# Patient Record
Sex: Female | Born: 1935 | Race: White | Hispanic: No | Marital: Single | Smoking: Never smoker
Health system: Southern US, Community
[De-identification: ages and names within clinical notes are randomized; demographics above are authoritative.]

## PROBLEM LIST (undated history)

## (undated) DIAGNOSIS — R627 Adult failure to thrive: Secondary | ICD-10-CM

## (undated) DIAGNOSIS — E039 Hypothyroidism, unspecified: Secondary | ICD-10-CM

## (undated) DIAGNOSIS — F419 Anxiety disorder, unspecified: Secondary | ICD-10-CM

## (undated) DIAGNOSIS — R339 Retention of urine, unspecified: Secondary | ICD-10-CM

## (undated) DIAGNOSIS — I10 Essential (primary) hypertension: Secondary | ICD-10-CM

## (undated) DIAGNOSIS — N183 Chronic kidney disease, stage 3 unspecified: Secondary | ICD-10-CM

## (undated) DIAGNOSIS — F32A Depression, unspecified: Secondary | ICD-10-CM

## (undated) DIAGNOSIS — R933 Abnormal findings on diagnostic imaging of other parts of digestive tract: Secondary | ICD-10-CM

## (undated) HISTORY — DX: Chronic kidney disease, stage 3 unspecified: N18.30

## (undated) HISTORY — DX: Depression, unspecified: F32.A

## (undated) HISTORY — DX: Adult failure to thrive: R62.7

## (undated) HISTORY — DX: Essential (primary) hypertension: I10

## (undated) HISTORY — PX: ABDOMINAL HYSTERECTOMY: SHX81

## (undated) HISTORY — DX: Retention of urine, unspecified: R33.9

---

## 1998-12-10 ENCOUNTER — Other Ambulatory Visit: Admission: RE | Admit: 1998-12-10 | Discharge: 1998-12-10 | Payer: Self-pay | Admitting: Gynecology

## 1999-12-11 ENCOUNTER — Other Ambulatory Visit: Admission: RE | Admit: 1999-12-11 | Discharge: 1999-12-11 | Payer: Self-pay | Admitting: Gynecology

## 1999-12-11 ENCOUNTER — Encounter (INDEPENDENT_AMBULATORY_CARE_PROVIDER_SITE_OTHER): Payer: Self-pay | Admitting: Specialist

## 2000-12-09 ENCOUNTER — Other Ambulatory Visit: Admission: RE | Admit: 2000-12-09 | Discharge: 2000-12-09 | Payer: Self-pay | Admitting: Gynecology

## 2002-01-24 ENCOUNTER — Other Ambulatory Visit: Admission: RE | Admit: 2002-01-24 | Discharge: 2002-01-24 | Payer: Self-pay | Admitting: Gynecology

## 2003-02-01 ENCOUNTER — Other Ambulatory Visit: Admission: RE | Admit: 2003-02-01 | Discharge: 2003-02-01 | Payer: Self-pay | Admitting: Gynecology

## 2003-03-23 ENCOUNTER — Ambulatory Visit (HOSPITAL_BASED_OUTPATIENT_CLINIC_OR_DEPARTMENT_OTHER): Admission: RE | Admit: 2003-03-23 | Discharge: 2003-03-23 | Payer: Self-pay | Admitting: Gynecology

## 2003-03-23 ENCOUNTER — Ambulatory Visit (HOSPITAL_COMMUNITY): Admission: RE | Admit: 2003-03-23 | Discharge: 2003-03-23 | Payer: Self-pay | Admitting: Gynecology

## 2003-03-23 ENCOUNTER — Encounter (INDEPENDENT_AMBULATORY_CARE_PROVIDER_SITE_OTHER): Payer: Self-pay | Admitting: *Deleted

## 2003-05-29 ENCOUNTER — Encounter (INDEPENDENT_AMBULATORY_CARE_PROVIDER_SITE_OTHER): Payer: Self-pay

## 2003-05-29 ENCOUNTER — Observation Stay (HOSPITAL_COMMUNITY): Admission: AD | Admit: 2003-05-29 | Discharge: 2003-05-30 | Payer: Self-pay | Admitting: Gynecology

## 2004-02-04 ENCOUNTER — Other Ambulatory Visit: Admission: RE | Admit: 2004-02-04 | Discharge: 2004-02-04 | Payer: Self-pay | Admitting: Gynecology

## 2005-02-16 ENCOUNTER — Other Ambulatory Visit: Admission: RE | Admit: 2005-02-16 | Discharge: 2005-02-16 | Payer: Self-pay | Admitting: Gynecology

## 2006-02-19 ENCOUNTER — Other Ambulatory Visit: Admission: RE | Admit: 2006-02-19 | Discharge: 2006-02-19 | Payer: Self-pay | Admitting: Gynecology

## 2007-02-21 ENCOUNTER — Other Ambulatory Visit: Admission: RE | Admit: 2007-02-21 | Discharge: 2007-02-21 | Payer: Self-pay | Admitting: Gynecology

## 2008-03-14 ENCOUNTER — Encounter (INDEPENDENT_AMBULATORY_CARE_PROVIDER_SITE_OTHER): Payer: Self-pay | Admitting: Gynecology

## 2008-03-14 ENCOUNTER — Ambulatory Visit (HOSPITAL_BASED_OUTPATIENT_CLINIC_OR_DEPARTMENT_OTHER): Admission: RE | Admit: 2008-03-14 | Discharge: 2008-03-14 | Payer: Self-pay | Admitting: Gynecology

## 2010-11-25 NOTE — Op Note (Signed)
Kimberly Frye, Kimberly Frye             ACCOUNT NO.:  [id number]   MEDICAL RECORD NO.:  [id number]          PATIENT TYPE:  AMB   LOCATION:  [facility]                         FACILITY:  [facility]   PHYSICIAN:  Gretta Cool, M.D. DATE OF BIRTH:  02-12-1933   DATE OF PROCEDURE:  03/14/2008  DATE OF DISCHARGE:                               OPERATIVE REPORT   PREOPERATIVE DIAGNOSES:  Lipoma of the left chest wall with discomfort  secondary to pressure.   POSTOPERATIVE DIAGNOSES:  Lipoma of the left chest wall with discomfort  secondary to pressure.   PROCEDURE:  Excision of left chest wall lipoma.   SURGEON:  Gretta Cool, M.D.   ANESTHESIA:  Intravenous sedation and local infiltration, Xylocaine and  Marcaine.   DESCRIPTION OF PROCEDURE:  After adequate infiltration with local  anesthetics as above, an incision was made over the lipoma.  Incision  was carried through the thick skin and subcutaneous tissue to the  capsule of the lipoma.  Once the capsule was identified, the lipoma was  shelled out and delivered to pathology.  The fascia overlying the lipoma  was then reapproximated with interrupted deep sutures of 3-0 Vicryl and  then the skin closed with mattress sutures of 4-0 nylon.  Benzoin and  Steri-Strips were then applied, then a pressure dressing applied.  The  patient was then returned to the recovery room in excellent condition,  without complication.  No significant bleeding at the procedure.  Note:  Xylocaine plain; no epinephrine was used.           ______________________________  Gretta Cool, M.D.     CWL/MEDQ  D:  03/14/2008  T:  03/14/2008  Job:  [id number]   cc:   Sadie Haber [facility]

## 2010-11-28 NOTE — Op Note (Signed)
NAME:  Kimberly Frye, Kimberly Frye                       ACCOUNT NO.:  [id number]   MEDICAL RECORD NO.:  [id number]                   PATIENT TYPE:  INP   LOCATION:  0465                                 FACILITY:  [facility]   PHYSICIAN:  Gretta Cool, M.D.              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   PROCEDURE:  Laparoscopically assisted vaginal hysterectomy, bilateral  salpingo-oophorectomy.   SURGEON:  Gretta Cool, M.D.   ASSISTANT:  Raynald Kemp, M.D.   ANESTHESIA:  General oral tracheal.   DESCRIPTION OF PROCEDURE:  Under general anesthesia with the patient's  abdomen prepped and draped in Allen stirrups in modified lithotomy position  for LAVH procedure, the cervix distended well on testing. A subumbilical  incision was made and Veress cannula introduced. After adequate  pneumoperitoneum, the laparoscope and trocar was introduced and pelvic  organs assessed. There was no evidence of abnormality other than fibroids  bulging from the subserosal surface. The accessory ports were then placed  under direct vision in the lateral quadrants bilaterally. At this point, the  uterus was elevated with the Hulka tenaculum and manipulated with grasping  forceps. A 5 mm tripolar forceps were then introduced and then the pedicles  progressively cauterized with bipolar current and then transected. The  tripolar forceps were used throughout the upper portion of the procedure.  First the infundibulopelvic ligament were treated and then the round  ligament and the dissection was carried down to about the level of the  uterine vessels. At this point, the pedicle bleeding was well controlled.  There were no complications. Attention was then turned to the vaginal  portion of the procedure. The patient was repositioned for the vaginal  portion and the cervix grasped with a single tooth tenaculum. The mucosa was  infiltrated with Xylocaine with epinephrine  1:100,000. The mucosa was then  incised and then pushed off the lower uterine segment. The cul-de-sac was  then entered and the uterosacral and then cardinal ligaments progressively,  clamped, cut sutured and tied with #0 Vicryl. At this point, the anterior  peritoneum was opened and a Deaver placed beneath the bladder to protect it.  The uterine vessels were then clamped, cut, sutured and tied with #0 Vicryl.  At this point, the uterus is inverted and the remaining pedicle clamped with  straight Masterson clamps. Those pedicles were then also ligated. At this  point, the pelvis is irrigated with lactated Ringer's, the pelvic peritoneum  is closed with a running pursestring suture of #0 Monocryl. A uterosacral  cardinal colposuspension was then performed using #0 novofil. The vaginal  cuff was then closed with interrupted mattress sutures of #0  Vicryl. At this  point, the pedicles were dry. The vagina was well supported. Attention was  then turned to the upper portion of the procedure. The abdomen was  reinflated and the pelvic pedicles examined with the laparoscope. All the  pedicles were dry, the pelvic floor was irrigated to remove all clot and  debris. At this point, approximately 100 mL of lactated Ringer's was left in  the pelvis and the instruments  removed, gas allowed to escape and the incision was closed with deep suture  of #0 Vicryl and subcuticular closure of 4-0 Monocryl. The skin incisions  were closed with Steri-Strips. At the end of the procedure, sponge and lap  counts were correct, there were no complications. The patient returned to  the recovery room in excellent condition.                                                Gretta Cool, M.D.    CWL/MEDQ  D:  05/29/2003  T:  05/30/2003  Job:  [id number]   cc:   Sadie Haber [facility]

## 2010-11-28 NOTE — H&P (Signed)
NAME:  Kimberly Frye, Kimberly Frye                       ACCOUNT NO.:  [id number]   MEDICAL RECORD NO.:  [id number]                   PATIENT TYPE:  INP   LOCATION:  0465                                 FACILITY:  [facility]   PHYSICIAN:  Gretta Cool, M.D.              DATE OF BIRTH:  [birth year]   DATE OF ADMISSION:  05/29/2003  DATE OF DISCHARGE:                                HISTORY & PHYSICAL   CHIEF COMPLAINT:  Atypical endometrial hyperplasia with history of  hysteroscopy resection of polyps with atypical hyperplasia, March 23, 2003.   HISTORY OF PRESENT ILLNESS:  A 77 year old, G2, P2, under the primary care  of [employer].  She has a history of abnormal uterine  bleeding on hormone replacement therapy.  She had endometrial sampling,  hysteroscopy, and resection, and was found to have atypical endometrial  complex hyperplasia.  There were areas that bordered on endocarcinoma.  The  largest focus was 9 mm in diameter.  There was no evidence of involvement of  the muscle or invasion.  After the sampling showed atypical hyperplasia,  hysterectomy was recommended.  She is now admitted for definitive therapy by  laparoscopically assisted hysterectomy and salpingo-oophorectomy.  She has  an uneventful obstetric history with two vaginal deliveries.  She has no  complaints of incontinence or urine control or other pelvic support issues.   PAST MEDICAL HISTORY:  She has an uneventful medical history.   PAST SURGICAL HISTORY:  T&A.   PRESENT MEDICATIONS:  1. Vivelle Dot 0.05.  2. Prometrium every other month, 1-12.  3. She is also on hormone replacement with Synthroid replacement at 0.05 mg     per day.   ALLERGIES:  None known.   FAMILY HISTORY:  Mother had uterine cancer and breast cancer.  Maternal  grandmother had cancer of unknown origin.  Father had MI.  Mother also had  stroke, and died of stroke at age 5.   HABITS:  Smokes 2-3 cigarettes per day.   Moderate alcohol.  Denies  recreational drugs.   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   REVIEW OF SYSTEMS:  HEENT:  Denies symptoms.  RESPIRATORY:  Denies asthma,  cough, or shortness of breath.  GI/GU:  Denies frequency, urgency, dysuria,  change in bowel habit, food intolerance.   PHYSICAL EXAMINATION:  GENERAL:  Well-developed, well-nourished, white  female.  HEENT:  Pupils equal, round and reactive to light and accommodation.  Fundi  not examined.  Oropharynx clear.  NECK:  Supple without masses or thyroid enlargement.  BREASTS:  Without masses noted.  No nipple discharge.  CHEST:  Clear to percussion and auscultation.  HEART:  Regular rhythm without murmur or cardiac enlargement.  ABDOMEN:  Soft, scaphoid, without mass or organomegaly.  PELVIC:  External genitalia normal female.  The vagina is clean and rugosed.  Cervix is parous with old obstetric laceration.  Uterus is normal size,  shape, contour.  Adnexa nonpalpable.  RECTOVAGINAL:  Confirms.  EXTREMITIES:  Negative.  NEUROLOGIC:  Physiologic.   IMPRESSION AND RECOMMENDATIONS:  Atypical complex hyperplasia bordering on  adenocarcinoma.  I have discussed options and standard therapy of  hysterectomy, salpingo-oophorectomy.  She wishes the quickest recovery.  I  have discussed laparoscopically-assisted hysterectomy and salpingo-  oophorectomy.  She is now admitted for definitive therapy.                                               Gretta Cool, M.D.    CWL/MEDQ  D:  05/29/2003  T:  05/29/2003  Job:  [id number]   cc:   [facility]  [place], [place]

## 2010-11-28 NOTE — Op Note (Signed)
   Kimberly Frye, Kimberly Frye                         ACCOUNT NO.:  [id number]   MEDICAL RECORD NO.:  [id number]                   PATIENT TYPE:  AMB   LOCATION:  [facility]                                 [facility]ACILITY:  [facility]   PHYSICIAN:  Gretta Cool, M.D.              DATE OF BIRTH:  [birth year]   DATE OF PROCEDURE:  03/23/2003  DATE OF DISCHARGE:                                 OPERATIVE REPORT   PREOPERATIVE DIAGNOSES:  Endometrial polyps, multiple with abnormal uterine  bleeding.   POSTOPERATIVE DIAGNOSES:  Endometrial polyps, multiple with abnormal uterine  bleeding.   PROCEDURE:  1. Hysteroscopy.  2. Resection of multiple endometrial polyps.  3. Total endometrial ablation by resection.   ANESTHESIA:  IV sedation.   SURGEON:  Gretta Cool, M.D.   DESCRIPTION OF PROCEDURE:  Under excellent IV sedation and with the patient  prepped and draped in Allen stirrups, cervix was grasped with a single tooth  tenaculum.  After adequate paracervical block anesthesia, the cervix was  progressively dilated with Shawnie Pons dilators to accommodate 7 mm resectoscope.  The uterine cavity was then systematically photographed and pictures of the  polyps obtained.  Polyps were then systematically resected down into the  myometrium at least 5 mm.  Once all of the polyps had been resected, the  entire endometrial cavity was also resected.  After complete removal of the  endometrial lining, at least all visible endometrium, the myometrial cavity  was then treated by VaporTrode so as to eliminate any islands of viable  endometrial tissue.  At this point the procedure was terminated without  complications.  The patient returned to the recovery room in excellent  condition.  Fluid deficit less than 200 mL.                                               Gretta Cool, M.D.    CWL/MEDQ  D:  03/23/2003  T:  03/23/2003  Job:  [id number]

## 2011-02-09 ENCOUNTER — Other Ambulatory Visit: Payer: Self-pay | Admitting: Gynecology

## 2011-04-15 ENCOUNTER — Emergency Department (HOSPITAL_COMMUNITY)
Admission: EM | Admit: 2011-04-15 | Discharge: 2011-04-15 | Disposition: A | Discharge status: Home / Self Care | Payer: No Typology Code available for payment source | Attending: Emergency Medicine | Admitting: Emergency Medicine

## 2011-04-15 ENCOUNTER — Encounter (HOSPITAL_COMMUNITY): Payer: Self-pay | Admitting: Radiology

## 2011-04-15 ENCOUNTER — Emergency Department (HOSPITAL_COMMUNITY): Payer: No Typology Code available for payment source

## 2011-04-15 DIAGNOSIS — R110 Nausea: Secondary | ICD-10-CM | POA: Insufficient documentation

## 2011-04-15 DIAGNOSIS — M545 Low back pain, unspecified: Secondary | ICD-10-CM | POA: Insufficient documentation

## 2011-04-15 DIAGNOSIS — S139XXA Sprain of joints and ligaments of unspecified parts of neck, initial encounter: Secondary | ICD-10-CM | POA: Insufficient documentation

## 2011-04-15 DIAGNOSIS — E039 Hypothyroidism, unspecified: Secondary | ICD-10-CM | POA: Insufficient documentation

## 2011-04-15 DIAGNOSIS — M542 Cervicalgia: Secondary | ICD-10-CM | POA: Insufficient documentation

## 2011-04-15 DIAGNOSIS — R51 Headache: Secondary | ICD-10-CM | POA: Insufficient documentation

## 2011-04-15 LAB — POCT HEMOGLOBIN-HEMACUE: Hemoglobin: 14.8
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
## 2012-02-02 DIAGNOSIS — H2510 Age-related nuclear cataract, unspecified eye: Secondary | ICD-10-CM | POA: Diagnosis not present

## 2012-02-03 DIAGNOSIS — Z0000 Encounter for general adult medical examination without abnormal findings: Secondary | ICD-10-CM | POA: Diagnosis not present

## 2012-02-03 DIAGNOSIS — E039 Hypothyroidism, unspecified: Secondary | ICD-10-CM | POA: Diagnosis not present

## 2012-02-03 DIAGNOSIS — E559 Vitamin D deficiency, unspecified: Secondary | ICD-10-CM | POA: Diagnosis not present

## 2012-02-10 DIAGNOSIS — E039 Hypothyroidism, unspecified: Secondary | ICD-10-CM | POA: Diagnosis not present

## 2012-02-10 DIAGNOSIS — F17200 Nicotine dependence, unspecified, uncomplicated: Secondary | ICD-10-CM | POA: Diagnosis not present

## 2012-02-10 DIAGNOSIS — I10 Essential (primary) hypertension: Secondary | ICD-10-CM | POA: Diagnosis not present

## 2012-02-10 DIAGNOSIS — R1013 Epigastric pain: Secondary | ICD-10-CM | POA: Diagnosis not present

## 2012-02-17 DIAGNOSIS — E039 Hypothyroidism, unspecified: Secondary | ICD-10-CM | POA: Diagnosis not present

## 2012-02-17 DIAGNOSIS — N760 Acute vaginitis: Secondary | ICD-10-CM | POA: Diagnosis not present

## 2012-03-01 DIAGNOSIS — H2510 Age-related nuclear cataract, unspecified eye: Secondary | ICD-10-CM | POA: Diagnosis not present

## 2012-03-01 DIAGNOSIS — H5340 Unspecified visual field defects: Secondary | ICD-10-CM | POA: Diagnosis not present

## 2012-03-01 DIAGNOSIS — H18419 Arcus senilis, unspecified eye: Secondary | ICD-10-CM | POA: Diagnosis not present

## 2012-03-01 DIAGNOSIS — H02839 Dermatochalasis of unspecified eye, unspecified eyelid: Secondary | ICD-10-CM | POA: Diagnosis not present

## 2012-03-03 DIAGNOSIS — H539 Unspecified visual disturbance: Secondary | ICD-10-CM | POA: Diagnosis not present

## 2012-03-09 DIAGNOSIS — R141 Gas pain: Secondary | ICD-10-CM | POA: Diagnosis not present

## 2012-03-09 DIAGNOSIS — Z86010 Personal history of colonic polyps: Secondary | ICD-10-CM | POA: Diagnosis not present

## 2012-03-15 DIAGNOSIS — H5340 Unspecified visual field defects: Secondary | ICD-10-CM | POA: Diagnosis not present

## 2012-03-18 DIAGNOSIS — Z85828 Personal history of other malignant neoplasm of skin: Secondary | ICD-10-CM | POA: Diagnosis not present

## 2012-03-18 DIAGNOSIS — L819 Disorder of pigmentation, unspecified: Secondary | ICD-10-CM | POA: Diagnosis not present

## 2012-03-18 DIAGNOSIS — L820 Inflamed seborrheic keratosis: Secondary | ICD-10-CM | POA: Diagnosis not present

## 2012-03-18 DIAGNOSIS — L821 Other seborrheic keratosis: Secondary | ICD-10-CM | POA: Diagnosis not present

## 2012-03-22 ENCOUNTER — Other Ambulatory Visit: Payer: Self-pay | Admitting: Gastroenterology

## 2012-03-22 DIAGNOSIS — K5730 Diverticulosis of large intestine without perforation or abscess without bleeding: Secondary | ICD-10-CM | POA: Diagnosis not present

## 2012-03-22 DIAGNOSIS — Z86010 Personal history of colonic polyps: Secondary | ICD-10-CM | POA: Diagnosis not present

## 2012-03-22 DIAGNOSIS — D126 Benign neoplasm of colon, unspecified: Secondary | ICD-10-CM | POA: Diagnosis not present

## 2012-03-22 DIAGNOSIS — Z09 Encounter for follow-up examination after completed treatment for conditions other than malignant neoplasm: Secondary | ICD-10-CM | POA: Diagnosis not present

## 2012-04-12 DIAGNOSIS — H5340 Unspecified visual field defects: Secondary | ICD-10-CM | POA: Diagnosis not present

## 2012-04-12 DIAGNOSIS — H3530 Unspecified macular degeneration: Secondary | ICD-10-CM | POA: Diagnosis not present

## 2012-04-12 DIAGNOSIS — H2510 Age-related nuclear cataract, unspecified eye: Secondary | ICD-10-CM | POA: Diagnosis not present

## 2012-05-06 DIAGNOSIS — J019 Acute sinusitis, unspecified: Secondary | ICD-10-CM | POA: Diagnosis not present

## 2012-05-06 DIAGNOSIS — M6749 Ganglion, multiple sites: Secondary | ICD-10-CM | POA: Diagnosis not present

## 2012-05-25 DIAGNOSIS — M25539 Pain in unspecified wrist: Secondary | ICD-10-CM | POA: Diagnosis not present

## 2012-07-11 DIAGNOSIS — M25539 Pain in unspecified wrist: Secondary | ICD-10-CM | POA: Diagnosis not present

## 2012-08-11 DIAGNOSIS — I10 Essential (primary) hypertension: Secondary | ICD-10-CM | POA: Diagnosis not present

## 2012-08-11 DIAGNOSIS — E559 Vitamin D deficiency, unspecified: Secondary | ICD-10-CM | POA: Diagnosis not present

## 2012-08-11 DIAGNOSIS — E039 Hypothyroidism, unspecified: Secondary | ICD-10-CM | POA: Diagnosis not present

## 2012-08-31 DIAGNOSIS — H25099 Other age-related incipient cataract, unspecified eye: Secondary | ICD-10-CM | POA: Diagnosis not present

## 2012-09-26 DIAGNOSIS — Z85828 Personal history of other malignant neoplasm of skin: Secondary | ICD-10-CM | POA: Diagnosis not present

## 2012-09-26 DIAGNOSIS — L820 Inflamed seborrheic keratosis: Secondary | ICD-10-CM | POA: Diagnosis not present

## 2012-09-26 DIAGNOSIS — L578 Other skin changes due to chronic exposure to nonionizing radiation: Secondary | ICD-10-CM | POA: Diagnosis not present

## 2012-09-26 DIAGNOSIS — D1801 Hemangioma of skin and subcutaneous tissue: Secondary | ICD-10-CM | POA: Diagnosis not present

## 2012-09-26 DIAGNOSIS — D692 Other nonthrombocytopenic purpura: Secondary | ICD-10-CM | POA: Diagnosis not present

## 2012-09-26 DIAGNOSIS — L821 Other seborrheic keratosis: Secondary | ICD-10-CM | POA: Diagnosis not present

## 2012-09-26 DIAGNOSIS — L819 Disorder of pigmentation, unspecified: Secondary | ICD-10-CM | POA: Diagnosis not present

## 2012-10-10 DIAGNOSIS — J019 Acute sinusitis, unspecified: Secondary | ICD-10-CM | POA: Diagnosis not present

## 2012-10-11 DIAGNOSIS — H5340 Unspecified visual field defects: Secondary | ICD-10-CM | POA: Diagnosis not present

## 2012-10-11 DIAGNOSIS — H2510 Age-related nuclear cataract, unspecified eye: Secondary | ICD-10-CM | POA: Diagnosis not present

## 2013-01-20 DIAGNOSIS — M545 Low back pain, unspecified: Secondary | ICD-10-CM | POA: Diagnosis not present

## 2013-01-20 DIAGNOSIS — M5126 Other intervertebral disc displacement, lumbar region: Secondary | ICD-10-CM | POA: Diagnosis not present

## 2013-01-25 ENCOUNTER — Ambulatory Visit: Payer: Medicare Other | Attending: Family Medicine

## 2013-01-25 DIAGNOSIS — IMO0001 Reserved for inherently not codable concepts without codable children: Secondary | ICD-10-CM | POA: Diagnosis not present

## 2013-01-25 DIAGNOSIS — M25659 Stiffness of unspecified hip, not elsewhere classified: Secondary | ICD-10-CM | POA: Diagnosis not present

## 2013-01-25 DIAGNOSIS — M4120 Other idiopathic scoliosis, site unspecified: Secondary | ICD-10-CM | POA: Insufficient documentation

## 2013-01-25 DIAGNOSIS — M545 Low back pain, unspecified: Secondary | ICD-10-CM | POA: Insufficient documentation

## 2013-01-31 ENCOUNTER — Ambulatory Visit: Payer: Medicare Other | Admitting: Physical Therapy

## 2013-01-31 DIAGNOSIS — M4120 Other idiopathic scoliosis, site unspecified: Secondary | ICD-10-CM | POA: Diagnosis not present

## 2013-01-31 DIAGNOSIS — M25659 Stiffness of unspecified hip, not elsewhere classified: Secondary | ICD-10-CM | POA: Diagnosis not present

## 2013-01-31 DIAGNOSIS — M545 Low back pain: Secondary | ICD-10-CM | POA: Diagnosis not present

## 2013-01-31 DIAGNOSIS — IMO0001 Reserved for inherently not codable concepts without codable children: Secondary | ICD-10-CM | POA: Diagnosis not present

## 2013-02-02 ENCOUNTER — Ambulatory Visit: Payer: Medicare Other | Admitting: Physical Therapy

## 2013-02-02 DIAGNOSIS — M545 Low back pain: Secondary | ICD-10-CM | POA: Diagnosis not present

## 2013-02-02 DIAGNOSIS — M25659 Stiffness of unspecified hip, not elsewhere classified: Secondary | ICD-10-CM | POA: Diagnosis not present

## 2013-02-02 DIAGNOSIS — IMO0001 Reserved for inherently not codable concepts without codable children: Secondary | ICD-10-CM | POA: Diagnosis not present

## 2013-02-02 DIAGNOSIS — M4120 Other idiopathic scoliosis, site unspecified: Secondary | ICD-10-CM | POA: Diagnosis not present

## 2013-02-07 ENCOUNTER — Ambulatory Visit: Payer: Medicare Other | Admitting: Physical Therapy

## 2013-02-07 DIAGNOSIS — M25659 Stiffness of unspecified hip, not elsewhere classified: Secondary | ICD-10-CM | POA: Diagnosis not present

## 2013-02-07 DIAGNOSIS — IMO0001 Reserved for inherently not codable concepts without codable children: Secondary | ICD-10-CM | POA: Diagnosis not present

## 2013-02-07 DIAGNOSIS — M4120 Other idiopathic scoliosis, site unspecified: Secondary | ICD-10-CM | POA: Diagnosis not present

## 2013-02-07 DIAGNOSIS — M545 Low back pain: Secondary | ICD-10-CM | POA: Diagnosis not present

## 2013-02-08 DIAGNOSIS — E039 Hypothyroidism, unspecified: Secondary | ICD-10-CM | POA: Diagnosis not present

## 2013-02-08 DIAGNOSIS — I10 Essential (primary) hypertension: Secondary | ICD-10-CM | POA: Diagnosis not present

## 2013-02-08 DIAGNOSIS — F17200 Nicotine dependence, unspecified, uncomplicated: Secondary | ICD-10-CM | POA: Diagnosis not present

## 2013-02-08 DIAGNOSIS — E559 Vitamin D deficiency, unspecified: Secondary | ICD-10-CM | POA: Diagnosis not present

## 2013-02-08 DIAGNOSIS — Z0000 Encounter for general adult medical examination without abnormal findings: Secondary | ICD-10-CM | POA: Diagnosis not present

## 2013-02-09 ENCOUNTER — Ambulatory Visit: Payer: Medicare Other | Admitting: Physical Therapy

## 2013-02-09 DIAGNOSIS — M25659 Stiffness of unspecified hip, not elsewhere classified: Secondary | ICD-10-CM | POA: Diagnosis not present

## 2013-02-09 DIAGNOSIS — M545 Low back pain: Secondary | ICD-10-CM | POA: Diagnosis not present

## 2013-02-09 DIAGNOSIS — M4120 Other idiopathic scoliosis, site unspecified: Secondary | ICD-10-CM | POA: Diagnosis not present

## 2013-02-09 DIAGNOSIS — IMO0001 Reserved for inherently not codable concepts without codable children: Secondary | ICD-10-CM | POA: Diagnosis not present

## 2013-02-14 ENCOUNTER — Ambulatory Visit: Payer: Medicare Other | Attending: Family Medicine | Admitting: Physical Therapy

## 2013-02-14 DIAGNOSIS — M545 Low back pain, unspecified: Secondary | ICD-10-CM | POA: Diagnosis not present

## 2013-02-14 DIAGNOSIS — M4120 Other idiopathic scoliosis, site unspecified: Secondary | ICD-10-CM | POA: Insufficient documentation

## 2013-02-14 DIAGNOSIS — M25659 Stiffness of unspecified hip, not elsewhere classified: Secondary | ICD-10-CM | POA: Diagnosis not present

## 2013-02-14 DIAGNOSIS — IMO0001 Reserved for inherently not codable concepts without codable children: Secondary | ICD-10-CM | POA: Insufficient documentation

## 2013-02-16 ENCOUNTER — Ambulatory Visit: Payer: Medicare Other | Admitting: Physical Therapy

## 2013-02-21 ENCOUNTER — Ambulatory Visit: Payer: Medicare Other | Admitting: Physical Therapy

## 2013-02-21 DIAGNOSIS — I10 Essential (primary) hypertension: Secondary | ICD-10-CM | POA: Diagnosis not present

## 2013-02-21 DIAGNOSIS — E559 Vitamin D deficiency, unspecified: Secondary | ICD-10-CM | POA: Diagnosis not present

## 2013-02-21 DIAGNOSIS — E039 Hypothyroidism, unspecified: Secondary | ICD-10-CM | POA: Diagnosis not present

## 2013-02-23 ENCOUNTER — Ambulatory Visit: Payer: Medicare Other | Admitting: Physical Therapy

## 2013-02-28 ENCOUNTER — Ambulatory Visit: Payer: Medicare Other | Admitting: Physical Therapy

## 2013-03-02 ENCOUNTER — Ambulatory Visit: Payer: Medicare Other | Admitting: Physical Therapy

## 2013-03-27 ENCOUNTER — Other Ambulatory Visit: Payer: Self-pay | Admitting: Dermatology

## 2013-03-27 DIAGNOSIS — L821 Other seborrheic keratosis: Secondary | ICD-10-CM | POA: Diagnosis not present

## 2013-03-27 DIAGNOSIS — L819 Disorder of pigmentation, unspecified: Secondary | ICD-10-CM | POA: Diagnosis not present

## 2013-03-27 DIAGNOSIS — D485 Neoplasm of uncertain behavior of skin: Secondary | ICD-10-CM | POA: Diagnosis not present

## 2013-03-27 DIAGNOSIS — Z85828 Personal history of other malignant neoplasm of skin: Secondary | ICD-10-CM | POA: Diagnosis not present

## 2013-03-27 DIAGNOSIS — L909 Atrophic disorder of skin, unspecified: Secondary | ICD-10-CM | POA: Diagnosis not present

## 2013-03-27 DIAGNOSIS — C44711 Basal cell carcinoma of skin of unspecified lower limb, including hip: Secondary | ICD-10-CM | POA: Diagnosis not present

## 2013-03-27 DIAGNOSIS — C44319 Basal cell carcinoma of skin of other parts of face: Secondary | ICD-10-CM | POA: Diagnosis not present

## 2013-04-05 DIAGNOSIS — C44711 Basal cell carcinoma of skin of unspecified lower limb, including hip: Secondary | ICD-10-CM | POA: Diagnosis not present

## 2013-04-05 DIAGNOSIS — Z85828 Personal history of other malignant neoplasm of skin: Secondary | ICD-10-CM | POA: Diagnosis not present

## 2013-04-19 DIAGNOSIS — C44319 Basal cell carcinoma of skin of other parts of face: Secondary | ICD-10-CM | POA: Diagnosis not present

## 2013-04-19 DIAGNOSIS — Z85828 Personal history of other malignant neoplasm of skin: Secondary | ICD-10-CM | POA: Diagnosis not present

## 2013-08-17 DIAGNOSIS — I10 Essential (primary) hypertension: Secondary | ICD-10-CM | POA: Diagnosis not present

## 2013-08-17 DIAGNOSIS — E559 Vitamin D deficiency, unspecified: Secondary | ICD-10-CM | POA: Diagnosis not present

## 2013-08-17 DIAGNOSIS — E039 Hypothyroidism, unspecified: Secondary | ICD-10-CM | POA: Diagnosis not present

## 2013-08-24 DIAGNOSIS — N951 Menopausal and female climacteric states: Secondary | ICD-10-CM | POA: Diagnosis not present

## 2013-08-24 DIAGNOSIS — E039 Hypothyroidism, unspecified: Secondary | ICD-10-CM | POA: Diagnosis not present

## 2013-08-24 DIAGNOSIS — N183 Chronic kidney disease, stage 3 unspecified: Secondary | ICD-10-CM | POA: Diagnosis not present

## 2013-08-24 DIAGNOSIS — F17200 Nicotine dependence, unspecified, uncomplicated: Secondary | ICD-10-CM | POA: Diagnosis not present

## 2013-08-24 DIAGNOSIS — I10 Essential (primary) hypertension: Secondary | ICD-10-CM | POA: Diagnosis not present

## 2013-09-20 DIAGNOSIS — N951 Menopausal and female climacteric states: Secondary | ICD-10-CM | POA: Diagnosis not present

## 2013-10-27 DIAGNOSIS — H25099 Other age-related incipient cataract, unspecified eye: Secondary | ICD-10-CM | POA: Diagnosis not present

## 2013-11-14 DIAGNOSIS — H40019 Open angle with borderline findings, low risk, unspecified eye: Secondary | ICD-10-CM | POA: Diagnosis not present

## 2013-11-14 DIAGNOSIS — H3530 Unspecified macular degeneration: Secondary | ICD-10-CM | POA: Diagnosis not present

## 2013-11-14 DIAGNOSIS — H18419 Arcus senilis, unspecified eye: Secondary | ICD-10-CM | POA: Diagnosis not present

## 2013-11-14 DIAGNOSIS — H2510 Age-related nuclear cataract, unspecified eye: Secondary | ICD-10-CM | POA: Diagnosis not present

## 2014-02-14 DIAGNOSIS — E039 Hypothyroidism, unspecified: Secondary | ICD-10-CM | POA: Diagnosis not present

## 2014-02-14 DIAGNOSIS — Z0000 Encounter for general adult medical examination without abnormal findings: Secondary | ICD-10-CM | POA: Diagnosis not present

## 2014-02-14 DIAGNOSIS — F17200 Nicotine dependence, unspecified, uncomplicated: Secondary | ICD-10-CM | POA: Diagnosis not present

## 2014-02-14 DIAGNOSIS — E559 Vitamin D deficiency, unspecified: Secondary | ICD-10-CM | POA: Diagnosis not present

## 2014-02-14 DIAGNOSIS — I10 Essential (primary) hypertension: Secondary | ICD-10-CM | POA: Diagnosis not present

## 2014-02-14 DIAGNOSIS — Z1331 Encounter for screening for depression: Secondary | ICD-10-CM | POA: Diagnosis not present

## 2014-02-21 DIAGNOSIS — F17200 Nicotine dependence, unspecified, uncomplicated: Secondary | ICD-10-CM | POA: Diagnosis not present

## 2014-02-21 DIAGNOSIS — E559 Vitamin D deficiency, unspecified: Secondary | ICD-10-CM | POA: Diagnosis not present

## 2014-02-21 DIAGNOSIS — N183 Chronic kidney disease, stage 3 unspecified: Secondary | ICD-10-CM | POA: Diagnosis not present

## 2014-02-21 DIAGNOSIS — E039 Hypothyroidism, unspecified: Secondary | ICD-10-CM | POA: Diagnosis not present

## 2014-02-21 DIAGNOSIS — I10 Essential (primary) hypertension: Secondary | ICD-10-CM | POA: Diagnosis not present

## 2014-04-09 ENCOUNTER — Other Ambulatory Visit: Payer: Self-pay | Admitting: Dermatology

## 2014-04-09 DIAGNOSIS — C4441 Basal cell carcinoma of skin of scalp and neck: Secondary | ICD-10-CM | POA: Diagnosis not present

## 2014-04-09 DIAGNOSIS — C44611 Basal cell carcinoma of skin of unspecified upper limb, including shoulder: Secondary | ICD-10-CM | POA: Diagnosis not present

## 2014-04-09 DIAGNOSIS — D2360 Other benign neoplasm of skin of unspecified upper limb, including shoulder: Secondary | ICD-10-CM | POA: Diagnosis not present

## 2014-04-09 DIAGNOSIS — Z85828 Personal history of other malignant neoplasm of skin: Secondary | ICD-10-CM | POA: Diagnosis not present

## 2014-04-09 DIAGNOSIS — C44319 Basal cell carcinoma of skin of other parts of face: Secondary | ICD-10-CM | POA: Diagnosis not present

## 2014-04-09 DIAGNOSIS — L821 Other seborrheic keratosis: Secondary | ICD-10-CM | POA: Diagnosis not present

## 2014-05-23 DIAGNOSIS — H43812 Vitreous degeneration, left eye: Secondary | ICD-10-CM | POA: Diagnosis not present

## 2014-05-23 DIAGNOSIS — H2511 Age-related nuclear cataract, right eye: Secondary | ICD-10-CM | POA: Diagnosis not present

## 2014-05-28 DIAGNOSIS — C44319 Basal cell carcinoma of skin of other parts of face: Secondary | ICD-10-CM | POA: Diagnosis not present

## 2014-05-28 DIAGNOSIS — C44311 Basal cell carcinoma of skin of nose: Secondary | ICD-10-CM | POA: Diagnosis not present

## 2014-05-28 DIAGNOSIS — Z85828 Personal history of other malignant neoplasm of skin: Secondary | ICD-10-CM | POA: Diagnosis not present

## 2014-08-20 DIAGNOSIS — E039 Hypothyroidism, unspecified: Secondary | ICD-10-CM | POA: Diagnosis not present

## 2014-08-20 DIAGNOSIS — I10 Essential (primary) hypertension: Secondary | ICD-10-CM | POA: Diagnosis not present

## 2014-08-20 DIAGNOSIS — E559 Vitamin D deficiency, unspecified: Secondary | ICD-10-CM | POA: Diagnosis not present

## 2014-09-13 DIAGNOSIS — E039 Hypothyroidism, unspecified: Secondary | ICD-10-CM | POA: Diagnosis not present

## 2014-09-13 DIAGNOSIS — F17210 Nicotine dependence, cigarettes, uncomplicated: Secondary | ICD-10-CM | POA: Diagnosis not present

## 2014-09-13 DIAGNOSIS — J309 Allergic rhinitis, unspecified: Secondary | ICD-10-CM | POA: Diagnosis not present

## 2014-09-13 DIAGNOSIS — I10 Essential (primary) hypertension: Secondary | ICD-10-CM | POA: Diagnosis not present

## 2014-09-13 DIAGNOSIS — N183 Chronic kidney disease, stage 3 (moderate): Secondary | ICD-10-CM | POA: Diagnosis not present

## 2014-10-08 ENCOUNTER — Other Ambulatory Visit: Payer: Self-pay | Admitting: Dermatology

## 2014-10-08 DIAGNOSIS — L814 Other melanin hyperpigmentation: Secondary | ICD-10-CM | POA: Diagnosis not present

## 2014-10-08 DIAGNOSIS — Z85828 Personal history of other malignant neoplasm of skin: Secondary | ICD-10-CM | POA: Diagnosis not present

## 2014-10-08 DIAGNOSIS — D1801 Hemangioma of skin and subcutaneous tissue: Secondary | ICD-10-CM | POA: Diagnosis not present

## 2014-10-08 DIAGNOSIS — L821 Other seborrheic keratosis: Secondary | ICD-10-CM | POA: Diagnosis not present

## 2014-10-08 DIAGNOSIS — L570 Actinic keratosis: Secondary | ICD-10-CM | POA: Diagnosis not present

## 2014-10-08 DIAGNOSIS — D225 Melanocytic nevi of trunk: Secondary | ICD-10-CM | POA: Diagnosis not present

## 2014-10-08 DIAGNOSIS — D224 Melanocytic nevi of scalp and neck: Secondary | ICD-10-CM | POA: Diagnosis not present

## 2014-10-26 DIAGNOSIS — H40003 Preglaucoma, unspecified, bilateral: Secondary | ICD-10-CM | POA: Diagnosis not present

## 2014-10-26 DIAGNOSIS — H25011 Cortical age-related cataract, right eye: Secondary | ICD-10-CM | POA: Diagnosis not present

## 2014-10-26 DIAGNOSIS — H43812 Vitreous degeneration, left eye: Secondary | ICD-10-CM | POA: Diagnosis not present

## 2014-11-08 DIAGNOSIS — K589 Irritable bowel syndrome without diarrhea: Secondary | ICD-10-CM | POA: Diagnosis not present

## 2014-11-29 DIAGNOSIS — H25099 Other age-related incipient cataract, unspecified eye: Secondary | ICD-10-CM | POA: Diagnosis not present

## 2014-12-28 DIAGNOSIS — R140 Abdominal distension (gaseous): Secondary | ICD-10-CM | POA: Diagnosis not present

## 2015-03-13 DIAGNOSIS — E559 Vitamin D deficiency, unspecified: Secondary | ICD-10-CM | POA: Diagnosis not present

## 2015-03-13 DIAGNOSIS — I10 Essential (primary) hypertension: Secondary | ICD-10-CM | POA: Diagnosis not present

## 2015-03-13 DIAGNOSIS — N390 Urinary tract infection, site not specified: Secondary | ICD-10-CM | POA: Diagnosis not present

## 2015-03-13 DIAGNOSIS — E039 Hypothyroidism, unspecified: Secondary | ICD-10-CM | POA: Diagnosis not present

## 2015-04-18 DIAGNOSIS — D224 Melanocytic nevi of scalp and neck: Secondary | ICD-10-CM | POA: Diagnosis not present

## 2015-04-18 DIAGNOSIS — D1801 Hemangioma of skin and subcutaneous tissue: Secondary | ICD-10-CM | POA: Diagnosis not present

## 2015-04-18 DIAGNOSIS — L821 Other seborrheic keratosis: Secondary | ICD-10-CM | POA: Diagnosis not present

## 2015-04-18 DIAGNOSIS — D225 Melanocytic nevi of trunk: Secondary | ICD-10-CM | POA: Diagnosis not present

## 2015-04-18 DIAGNOSIS — L814 Other melanin hyperpigmentation: Secondary | ICD-10-CM | POA: Diagnosis not present

## 2015-04-18 DIAGNOSIS — D692 Other nonthrombocytopenic purpura: Secondary | ICD-10-CM | POA: Diagnosis not present

## 2015-04-18 DIAGNOSIS — Z85828 Personal history of other malignant neoplasm of skin: Secondary | ICD-10-CM | POA: Diagnosis not present

## 2015-05-17 DIAGNOSIS — N183 Chronic kidney disease, stage 3 (moderate): Secondary | ICD-10-CM | POA: Diagnosis not present

## 2015-05-17 DIAGNOSIS — E039 Hypothyroidism, unspecified: Secondary | ICD-10-CM | POA: Diagnosis not present

## 2015-05-17 DIAGNOSIS — F17210 Nicotine dependence, cigarettes, uncomplicated: Secondary | ICD-10-CM | POA: Diagnosis not present

## 2015-05-17 DIAGNOSIS — I129 Hypertensive chronic kidney disease with stage 1 through stage 4 chronic kidney disease, or unspecified chronic kidney disease: Secondary | ICD-10-CM | POA: Diagnosis not present

## 2015-05-17 DIAGNOSIS — R109 Unspecified abdominal pain: Secondary | ICD-10-CM | POA: Diagnosis not present

## 2015-06-11 DIAGNOSIS — L0390 Cellulitis, unspecified: Secondary | ICD-10-CM | POA: Diagnosis not present

## 2015-06-13 DIAGNOSIS — L0390 Cellulitis, unspecified: Secondary | ICD-10-CM | POA: Diagnosis not present

## 2015-06-13 DIAGNOSIS — T148 Other injury of unspecified body region: Secondary | ICD-10-CM | POA: Diagnosis not present

## 2015-06-28 DIAGNOSIS — T148 Other injury of unspecified body region: Secondary | ICD-10-CM | POA: Diagnosis not present

## 2015-06-28 DIAGNOSIS — L0390 Cellulitis, unspecified: Secondary | ICD-10-CM | POA: Diagnosis not present

## 2015-06-28 DIAGNOSIS — L03116 Cellulitis of left lower limb: Secondary | ICD-10-CM | POA: Diagnosis not present

## 2015-07-03 DIAGNOSIS — L03116 Cellulitis of left lower limb: Secondary | ICD-10-CM | POA: Diagnosis not present

## 2015-08-08 DIAGNOSIS — L0390 Cellulitis, unspecified: Secondary | ICD-10-CM | POA: Diagnosis not present

## 2015-08-20 DIAGNOSIS — Z85828 Personal history of other malignant neoplasm of skin: Secondary | ICD-10-CM | POA: Diagnosis not present

## 2015-08-20 DIAGNOSIS — I8312 Varicose veins of left lower extremity with inflammation: Secondary | ICD-10-CM | POA: Diagnosis not present

## 2015-08-20 DIAGNOSIS — I8311 Varicose veins of right lower extremity with inflammation: Secondary | ICD-10-CM | POA: Diagnosis not present

## 2015-08-20 DIAGNOSIS — I872 Venous insufficiency (chronic) (peripheral): Secondary | ICD-10-CM | POA: Diagnosis not present

## 2015-10-16 DIAGNOSIS — I8391 Asymptomatic varicose veins of right lower extremity: Secondary | ICD-10-CM | POA: Diagnosis not present

## 2015-10-16 DIAGNOSIS — I8392 Asymptomatic varicose veins of left lower extremity: Secondary | ICD-10-CM | POA: Diagnosis not present

## 2015-10-16 DIAGNOSIS — I8312 Varicose veins of left lower extremity with inflammation: Secondary | ICD-10-CM | POA: Diagnosis not present

## 2015-10-16 DIAGNOSIS — L821 Other seborrheic keratosis: Secondary | ICD-10-CM | POA: Diagnosis not present

## 2015-10-16 DIAGNOSIS — I872 Venous insufficiency (chronic) (peripheral): Secondary | ICD-10-CM | POA: Diagnosis not present

## 2015-10-16 DIAGNOSIS — D2261 Melanocytic nevi of right upper limb, including shoulder: Secondary | ICD-10-CM | POA: Diagnosis not present

## 2015-10-16 DIAGNOSIS — Z85828 Personal history of other malignant neoplasm of skin: Secondary | ICD-10-CM | POA: Diagnosis not present

## 2015-10-16 DIAGNOSIS — L570 Actinic keratosis: Secondary | ICD-10-CM | POA: Diagnosis not present

## 2015-10-16 DIAGNOSIS — D2262 Melanocytic nevi of left upper limb, including shoulder: Secondary | ICD-10-CM | POA: Diagnosis not present

## 2015-10-16 DIAGNOSIS — D1801 Hemangioma of skin and subcutaneous tissue: Secondary | ICD-10-CM | POA: Diagnosis not present

## 2015-10-16 DIAGNOSIS — L814 Other melanin hyperpigmentation: Secondary | ICD-10-CM | POA: Diagnosis not present

## 2015-10-16 DIAGNOSIS — I8311 Varicose veins of right lower extremity with inflammation: Secondary | ICD-10-CM | POA: Diagnosis not present

## 2015-11-04 DIAGNOSIS — H18411 Arcus senilis, right eye: Secondary | ICD-10-CM | POA: Diagnosis not present

## 2015-11-04 DIAGNOSIS — H40003 Preglaucoma, unspecified, bilateral: Secondary | ICD-10-CM | POA: Diagnosis not present

## 2015-11-04 DIAGNOSIS — H43812 Vitreous degeneration, left eye: Secondary | ICD-10-CM | POA: Diagnosis not present

## 2015-11-04 DIAGNOSIS — H25011 Cortical age-related cataract, right eye: Secondary | ICD-10-CM | POA: Diagnosis not present

## 2015-11-04 DIAGNOSIS — H2511 Age-related nuclear cataract, right eye: Secondary | ICD-10-CM | POA: Diagnosis not present

## 2015-11-14 DIAGNOSIS — E039 Hypothyroidism, unspecified: Secondary | ICD-10-CM | POA: Diagnosis not present

## 2015-11-14 DIAGNOSIS — E559 Vitamin D deficiency, unspecified: Secondary | ICD-10-CM | POA: Diagnosis not present

## 2015-11-14 DIAGNOSIS — I129 Hypertensive chronic kidney disease with stage 1 through stage 4 chronic kidney disease, or unspecified chronic kidney disease: Secondary | ICD-10-CM | POA: Diagnosis not present

## 2015-11-21 DIAGNOSIS — E039 Hypothyroidism, unspecified: Secondary | ICD-10-CM | POA: Diagnosis not present

## 2015-11-21 DIAGNOSIS — I129 Hypertensive chronic kidney disease with stage 1 through stage 4 chronic kidney disease, or unspecified chronic kidney disease: Secondary | ICD-10-CM | POA: Diagnosis not present

## 2015-11-21 DIAGNOSIS — F17210 Nicotine dependence, cigarettes, uncomplicated: Secondary | ICD-10-CM | POA: Diagnosis not present

## 2015-11-21 DIAGNOSIS — I700 Atherosclerosis of aorta: Secondary | ICD-10-CM | POA: Diagnosis not present

## 2015-11-21 DIAGNOSIS — N183 Chronic kidney disease, stage 3 (moderate): Secondary | ICD-10-CM | POA: Diagnosis not present

## 2015-11-21 DIAGNOSIS — Z23 Encounter for immunization: Secondary | ICD-10-CM | POA: Diagnosis not present

## 2015-12-27 DIAGNOSIS — H25099 Other age-related incipient cataract, unspecified eye: Secondary | ICD-10-CM | POA: Diagnosis not present

## 2015-12-27 DIAGNOSIS — H25813 Combined forms of age-related cataract, bilateral: Secondary | ICD-10-CM | POA: Diagnosis not present

## 2015-12-27 DIAGNOSIS — H524 Presbyopia: Secondary | ICD-10-CM | POA: Diagnosis not present

## 2015-12-27 DIAGNOSIS — H5203 Hypermetropia, bilateral: Secondary | ICD-10-CM | POA: Diagnosis not present

## 2015-12-27 DIAGNOSIS — H52223 Regular astigmatism, bilateral: Secondary | ICD-10-CM | POA: Diagnosis not present

## 2016-02-18 DIAGNOSIS — H18412 Arcus senilis, left eye: Secondary | ICD-10-CM | POA: Diagnosis not present

## 2016-02-18 DIAGNOSIS — H43812 Vitreous degeneration, left eye: Secondary | ICD-10-CM | POA: Diagnosis not present

## 2016-02-18 DIAGNOSIS — H18411 Arcus senilis, right eye: Secondary | ICD-10-CM | POA: Diagnosis not present

## 2016-02-18 DIAGNOSIS — H2511 Age-related nuclear cataract, right eye: Secondary | ICD-10-CM | POA: Diagnosis not present

## 2016-02-18 DIAGNOSIS — H40003 Preglaucoma, unspecified, bilateral: Secondary | ICD-10-CM | POA: Diagnosis not present

## 2016-02-21 DIAGNOSIS — I129 Hypertensive chronic kidney disease with stage 1 through stage 4 chronic kidney disease, or unspecified chronic kidney disease: Secondary | ICD-10-CM | POA: Diagnosis not present

## 2016-02-28 DIAGNOSIS — I700 Atherosclerosis of aorta: Secondary | ICD-10-CM | POA: Diagnosis not present

## 2016-02-28 DIAGNOSIS — I129 Hypertensive chronic kidney disease with stage 1 through stage 4 chronic kidney disease, or unspecified chronic kidney disease: Secondary | ICD-10-CM | POA: Diagnosis not present

## 2016-02-28 DIAGNOSIS — R634 Abnormal weight loss: Secondary | ICD-10-CM | POA: Diagnosis not present

## 2016-02-28 DIAGNOSIS — E785 Hyperlipidemia, unspecified: Secondary | ICD-10-CM | POA: Diagnosis not present

## 2016-03-05 ENCOUNTER — Other Ambulatory Visit: Payer: Self-pay | Admitting: Gastroenterology

## 2016-03-05 DIAGNOSIS — R109 Unspecified abdominal pain: Secondary | ICD-10-CM

## 2016-03-05 DIAGNOSIS — R634 Abnormal weight loss: Secondary | ICD-10-CM

## 2016-03-18 ENCOUNTER — Ambulatory Visit
Admission: RE | Admit: 2016-03-18 | Discharge: 2016-03-18 | Disposition: A | Discharge status: Home / Self Care | Payer: Medicare Other | Source: Ambulatory Visit | Attending: Gastroenterology | Admitting: Gastroenterology

## 2016-03-18 DIAGNOSIS — R634 Abnormal weight loss: Secondary | ICD-10-CM

## 2016-03-18 DIAGNOSIS — K8020 Calculus of gallbladder without cholecystitis without obstruction: Secondary | ICD-10-CM | POA: Diagnosis not present

## 2016-03-18 DIAGNOSIS — R109 Unspecified abdominal pain: Secondary | ICD-10-CM

## 2016-03-18 MED ORDER — IOPAMIDOL (ISOVUE-300) INJECTION 61%
100.0000 mL | Freq: Once | INTRAVENOUS | Status: AC | PRN
  Administered 2016-03-18: 100 mL via INTRAVENOUS

## 2016-03-23 DIAGNOSIS — K861 Other chronic pancreatitis: Secondary | ICD-10-CM | POA: Diagnosis not present

## 2016-03-23 DIAGNOSIS — R634 Abnormal weight loss: Secondary | ICD-10-CM | POA: Diagnosis not present

## 2016-03-30 DIAGNOSIS — R5381 Other malaise: Secondary | ICD-10-CM | POA: Diagnosis not present

## 2016-04-13 DIAGNOSIS — K5900 Constipation, unspecified: Secondary | ICD-10-CM | POA: Diagnosis not present

## 2016-04-13 DIAGNOSIS — K861 Other chronic pancreatitis: Secondary | ICD-10-CM | POA: Diagnosis not present

## 2016-04-20 DIAGNOSIS — H2511 Age-related nuclear cataract, right eye: Secondary | ICD-10-CM | POA: Diagnosis not present

## 2016-04-20 DIAGNOSIS — H25041 Posterior subcapsular polar age-related cataract, right eye: Secondary | ICD-10-CM | POA: Diagnosis not present

## 2016-04-20 DIAGNOSIS — H25011 Cortical age-related cataract, right eye: Secondary | ICD-10-CM | POA: Diagnosis not present

## 2016-04-21 DIAGNOSIS — H2512 Age-related nuclear cataract, left eye: Secondary | ICD-10-CM | POA: Diagnosis not present

## 2016-04-29 DIAGNOSIS — K2930 Chronic superficial gastritis without bleeding: Secondary | ICD-10-CM | POA: Diagnosis not present

## 2016-04-29 DIAGNOSIS — K5520 Angiodysplasia of colon without hemorrhage: Secondary | ICD-10-CM | POA: Diagnosis not present

## 2016-04-29 DIAGNOSIS — R109 Unspecified abdominal pain: Secondary | ICD-10-CM | POA: Diagnosis not present

## 2016-04-29 DIAGNOSIS — K5730 Diverticulosis of large intestine without perforation or abscess without bleeding: Secondary | ICD-10-CM | POA: Diagnosis not present

## 2016-04-29 DIAGNOSIS — D126 Benign neoplasm of colon, unspecified: Secondary | ICD-10-CM | POA: Diagnosis not present

## 2016-04-29 DIAGNOSIS — D124 Benign neoplasm of descending colon: Secondary | ICD-10-CM | POA: Diagnosis not present

## 2016-04-29 DIAGNOSIS — R634 Abnormal weight loss: Secondary | ICD-10-CM | POA: Diagnosis not present

## 2016-05-05 DIAGNOSIS — D126 Benign neoplasm of colon, unspecified: Secondary | ICD-10-CM | POA: Diagnosis not present

## 2016-05-05 DIAGNOSIS — K2930 Chronic superficial gastritis without bleeding: Secondary | ICD-10-CM | POA: Diagnosis not present

## 2016-05-11 DIAGNOSIS — H25042 Posterior subcapsular polar age-related cataract, left eye: Secondary | ICD-10-CM | POA: Diagnosis not present

## 2016-05-11 DIAGNOSIS — H2512 Age-related nuclear cataract, left eye: Secondary | ICD-10-CM | POA: Diagnosis not present

## 2016-05-11 DIAGNOSIS — H25012 Cortical age-related cataract, left eye: Secondary | ICD-10-CM | POA: Diagnosis not present

## 2016-05-18 DIAGNOSIS — R1084 Generalized abdominal pain: Secondary | ICD-10-CM | POA: Diagnosis not present

## 2016-05-21 DIAGNOSIS — I129 Hypertensive chronic kidney disease with stage 1 through stage 4 chronic kidney disease, or unspecified chronic kidney disease: Secondary | ICD-10-CM | POA: Diagnosis not present

## 2016-05-27 DIAGNOSIS — F17210 Nicotine dependence, cigarettes, uncomplicated: Secondary | ICD-10-CM | POA: Diagnosis not present

## 2016-05-27 DIAGNOSIS — N183 Chronic kidney disease, stage 3 (moderate): Secondary | ICD-10-CM | POA: Diagnosis not present

## 2016-05-27 DIAGNOSIS — E785 Hyperlipidemia, unspecified: Secondary | ICD-10-CM | POA: Diagnosis not present

## 2016-05-27 DIAGNOSIS — Z0000 Encounter for general adult medical examination without abnormal findings: Secondary | ICD-10-CM | POA: Diagnosis not present

## 2016-05-27 DIAGNOSIS — I700 Atherosclerosis of aorta: Secondary | ICD-10-CM | POA: Diagnosis not present

## 2016-06-10 DIAGNOSIS — L821 Other seborrheic keratosis: Secondary | ICD-10-CM | POA: Diagnosis not present

## 2016-06-10 DIAGNOSIS — C4441 Basal cell carcinoma of skin of scalp and neck: Secondary | ICD-10-CM | POA: Diagnosis not present

## 2016-06-10 DIAGNOSIS — L570 Actinic keratosis: Secondary | ICD-10-CM | POA: Diagnosis not present

## 2016-06-10 DIAGNOSIS — D1801 Hemangioma of skin and subcutaneous tissue: Secondary | ICD-10-CM | POA: Diagnosis not present

## 2016-06-10 DIAGNOSIS — L814 Other melanin hyperpigmentation: Secondary | ICD-10-CM | POA: Diagnosis not present

## 2016-06-10 DIAGNOSIS — D225 Melanocytic nevi of trunk: Secondary | ICD-10-CM | POA: Diagnosis not present

## 2016-06-10 DIAGNOSIS — Z85828 Personal history of other malignant neoplasm of skin: Secondary | ICD-10-CM | POA: Diagnosis not present

## 2016-06-29 DIAGNOSIS — Z85828 Personal history of other malignant neoplasm of skin: Secondary | ICD-10-CM | POA: Diagnosis not present

## 2016-06-29 DIAGNOSIS — C4441 Basal cell carcinoma of skin of scalp and neck: Secondary | ICD-10-CM | POA: Diagnosis not present

## 2016-08-18 DIAGNOSIS — K5900 Constipation, unspecified: Secondary | ICD-10-CM | POA: Diagnosis not present

## 2016-10-01 DIAGNOSIS — H04123 Dry eye syndrome of bilateral lacrimal glands: Secondary | ICD-10-CM | POA: Diagnosis not present

## 2016-11-17 DIAGNOSIS — E785 Hyperlipidemia, unspecified: Secondary | ICD-10-CM | POA: Diagnosis not present

## 2016-11-17 DIAGNOSIS — E039 Hypothyroidism, unspecified: Secondary | ICD-10-CM | POA: Diagnosis not present

## 2016-11-17 DIAGNOSIS — E559 Vitamin D deficiency, unspecified: Secondary | ICD-10-CM | POA: Diagnosis not present

## 2016-11-17 DIAGNOSIS — I10 Essential (primary) hypertension: Secondary | ICD-10-CM | POA: Diagnosis not present

## 2016-11-17 DIAGNOSIS — I129 Hypertensive chronic kidney disease with stage 1 through stage 4 chronic kidney disease, or unspecified chronic kidney disease: Secondary | ICD-10-CM | POA: Diagnosis not present

## 2016-11-24 DIAGNOSIS — I10 Essential (primary) hypertension: Secondary | ICD-10-CM | POA: Diagnosis not present

## 2016-11-24 DIAGNOSIS — E039 Hypothyroidism, unspecified: Secondary | ICD-10-CM | POA: Diagnosis not present

## 2016-11-24 DIAGNOSIS — E7800 Pure hypercholesterolemia, unspecified: Secondary | ICD-10-CM | POA: Diagnosis not present

## 2016-11-24 DIAGNOSIS — N183 Chronic kidney disease, stage 3 (moderate): Secondary | ICD-10-CM | POA: Diagnosis not present

## 2016-12-10 DIAGNOSIS — C44319 Basal cell carcinoma of skin of other parts of face: Secondary | ICD-10-CM | POA: Diagnosis not present

## 2016-12-10 DIAGNOSIS — D225 Melanocytic nevi of trunk: Secondary | ICD-10-CM | POA: Diagnosis not present

## 2016-12-10 DIAGNOSIS — C44612 Basal cell carcinoma of skin of right upper limb, including shoulder: Secondary | ICD-10-CM | POA: Diagnosis not present

## 2016-12-10 DIAGNOSIS — D1801 Hemangioma of skin and subcutaneous tissue: Secondary | ICD-10-CM | POA: Diagnosis not present

## 2016-12-10 DIAGNOSIS — Z85828 Personal history of other malignant neoplasm of skin: Secondary | ICD-10-CM | POA: Diagnosis not present

## 2016-12-10 DIAGNOSIS — L821 Other seborrheic keratosis: Secondary | ICD-10-CM | POA: Diagnosis not present

## 2016-12-10 DIAGNOSIS — L814 Other melanin hyperpigmentation: Secondary | ICD-10-CM | POA: Diagnosis not present

## 2017-01-05 DIAGNOSIS — Z85828 Personal history of other malignant neoplasm of skin: Secondary | ICD-10-CM | POA: Diagnosis not present

## 2017-01-05 DIAGNOSIS — C44319 Basal cell carcinoma of skin of other parts of face: Secondary | ICD-10-CM | POA: Diagnosis not present

## 2017-02-04 DIAGNOSIS — Z85828 Personal history of other malignant neoplasm of skin: Secondary | ICD-10-CM | POA: Diagnosis not present

## 2017-02-04 DIAGNOSIS — L718 Other rosacea: Secondary | ICD-10-CM | POA: Diagnosis not present

## 2017-02-04 DIAGNOSIS — R21 Rash and other nonspecific skin eruption: Secondary | ICD-10-CM | POA: Diagnosis not present

## 2017-02-16 DIAGNOSIS — Z961 Presence of intraocular lens: Secondary | ICD-10-CM | POA: Diagnosis not present

## 2017-05-24 DIAGNOSIS — E7800 Pure hypercholesterolemia, unspecified: Secondary | ICD-10-CM | POA: Diagnosis not present

## 2017-05-24 DIAGNOSIS — E039 Hypothyroidism, unspecified: Secondary | ICD-10-CM | POA: Diagnosis not present

## 2017-05-24 DIAGNOSIS — E559 Vitamin D deficiency, unspecified: Secondary | ICD-10-CM | POA: Diagnosis not present

## 2017-05-31 DIAGNOSIS — Z0000 Encounter for general adult medical examination without abnormal findings: Secondary | ICD-10-CM | POA: Diagnosis not present

## 2017-05-31 DIAGNOSIS — Z79890 Hormone replacement therapy (postmenopausal): Secondary | ICD-10-CM | POA: Diagnosis not present

## 2017-05-31 DIAGNOSIS — K5909 Other constipation: Secondary | ICD-10-CM | POA: Diagnosis not present

## 2017-07-14 DIAGNOSIS — L821 Other seborrheic keratosis: Secondary | ICD-10-CM | POA: Diagnosis not present

## 2017-07-14 DIAGNOSIS — L570 Actinic keratosis: Secondary | ICD-10-CM | POA: Diagnosis not present

## 2017-07-14 DIAGNOSIS — Z85828 Personal history of other malignant neoplasm of skin: Secondary | ICD-10-CM | POA: Diagnosis not present

## 2017-07-14 DIAGNOSIS — D692 Other nonthrombocytopenic purpura: Secondary | ICD-10-CM | POA: Diagnosis not present

## 2017-07-14 DIAGNOSIS — D225 Melanocytic nevi of trunk: Secondary | ICD-10-CM | POA: Diagnosis not present

## 2017-07-14 DIAGNOSIS — D1801 Hemangioma of skin and subcutaneous tissue: Secondary | ICD-10-CM | POA: Diagnosis not present

## 2017-07-14 DIAGNOSIS — L814 Other melanin hyperpigmentation: Secondary | ICD-10-CM | POA: Diagnosis not present

## 2018-01-19 DIAGNOSIS — L821 Other seborrheic keratosis: Secondary | ICD-10-CM | POA: Diagnosis not present

## 2018-01-19 DIAGNOSIS — L814 Other melanin hyperpigmentation: Secondary | ICD-10-CM | POA: Diagnosis not present

## 2018-01-19 DIAGNOSIS — Z85828 Personal history of other malignant neoplasm of skin: Secondary | ICD-10-CM | POA: Diagnosis not present

## 2018-01-19 DIAGNOSIS — D225 Melanocytic nevi of trunk: Secondary | ICD-10-CM | POA: Diagnosis not present

## 2018-04-26 DIAGNOSIS — H524 Presbyopia: Secondary | ICD-10-CM | POA: Diagnosis not present

## 2018-04-26 DIAGNOSIS — H52223 Regular astigmatism, bilateral: Secondary | ICD-10-CM | POA: Diagnosis not present

## 2018-04-26 DIAGNOSIS — H5203 Hypermetropia, bilateral: Secondary | ICD-10-CM | POA: Diagnosis not present

## 2018-04-26 DIAGNOSIS — Z961 Presence of intraocular lens: Secondary | ICD-10-CM | POA: Diagnosis not present

## 2018-05-26 DIAGNOSIS — E559 Vitamin D deficiency, unspecified: Secondary | ICD-10-CM | POA: Diagnosis not present

## 2018-05-26 DIAGNOSIS — E039 Hypothyroidism, unspecified: Secondary | ICD-10-CM | POA: Diagnosis not present

## 2018-05-26 DIAGNOSIS — E785 Hyperlipidemia, unspecified: Secondary | ICD-10-CM | POA: Diagnosis not present

## 2018-06-02 DIAGNOSIS — K5909 Other constipation: Secondary | ICD-10-CM | POA: Diagnosis not present

## 2018-06-02 DIAGNOSIS — E039 Hypothyroidism, unspecified: Secondary | ICD-10-CM | POA: Diagnosis not present

## 2018-06-02 DIAGNOSIS — F439 Reaction to severe stress, unspecified: Secondary | ICD-10-CM | POA: Diagnosis not present

## 2018-06-02 DIAGNOSIS — Z1212 Encounter for screening for malignant neoplasm of rectum: Secondary | ICD-10-CM | POA: Diagnosis not present

## 2018-06-02 DIAGNOSIS — Z01419 Encounter for gynecological examination (general) (routine) without abnormal findings: Secondary | ICD-10-CM | POA: Diagnosis not present

## 2018-06-02 DIAGNOSIS — M859 Disorder of bone density and structure, unspecified: Secondary | ICD-10-CM | POA: Diagnosis not present

## 2018-06-02 DIAGNOSIS — M8580 Other specified disorders of bone density and structure, unspecified site: Secondary | ICD-10-CM | POA: Diagnosis not present

## 2018-06-02 DIAGNOSIS — Z23 Encounter for immunization: Secondary | ICD-10-CM | POA: Diagnosis not present

## 2018-06-02 DIAGNOSIS — Z79890 Hormone replacement therapy (postmenopausal): Secondary | ICD-10-CM | POA: Diagnosis not present

## 2018-06-02 DIAGNOSIS — Z0000 Encounter for general adult medical examination without abnormal findings: Secondary | ICD-10-CM | POA: Diagnosis not present

## 2018-06-02 DIAGNOSIS — E559 Vitamin D deficiency, unspecified: Secondary | ICD-10-CM | POA: Diagnosis not present

## 2018-06-25 DIAGNOSIS — J069 Acute upper respiratory infection, unspecified: Secondary | ICD-10-CM | POA: Diagnosis not present

## 2018-08-18 DIAGNOSIS — L821 Other seborrheic keratosis: Secondary | ICD-10-CM | POA: Diagnosis not present

## 2018-08-18 DIAGNOSIS — L814 Other melanin hyperpigmentation: Secondary | ICD-10-CM | POA: Diagnosis not present

## 2018-08-18 DIAGNOSIS — Z85828 Personal history of other malignant neoplasm of skin: Secondary | ICD-10-CM | POA: Diagnosis not present

## 2018-08-18 DIAGNOSIS — D225 Melanocytic nevi of trunk: Secondary | ICD-10-CM | POA: Diagnosis not present

## 2018-08-18 DIAGNOSIS — D1801 Hemangioma of skin and subcutaneous tissue: Secondary | ICD-10-CM | POA: Diagnosis not present

## 2018-08-18 DIAGNOSIS — L438 Other lichen planus: Secondary | ICD-10-CM | POA: Diagnosis not present

## 2018-12-21 DIAGNOSIS — M19011 Primary osteoarthritis, right shoulder: Secondary | ICD-10-CM | POA: Diagnosis not present

## 2018-12-21 DIAGNOSIS — M25511 Pain in right shoulder: Secondary | ICD-10-CM | POA: Diagnosis not present

## 2018-12-21 DIAGNOSIS — M25551 Pain in right hip: Secondary | ICD-10-CM | POA: Diagnosis not present

## 2018-12-28 DIAGNOSIS — M7541 Impingement syndrome of right shoulder: Secondary | ICD-10-CM | POA: Diagnosis not present

## 2019-01-18 DIAGNOSIS — M7541 Impingement syndrome of right shoulder: Secondary | ICD-10-CM | POA: Diagnosis not present

## 2019-01-18 DIAGNOSIS — M25512 Pain in left shoulder: Secondary | ICD-10-CM | POA: Diagnosis not present

## 2019-01-18 DIAGNOSIS — M25511 Pain in right shoulder: Secondary | ICD-10-CM | POA: Diagnosis not present

## 2019-01-27 DIAGNOSIS — J309 Allergic rhinitis, unspecified: Secondary | ICD-10-CM | POA: Diagnosis not present

## 2019-02-07 DIAGNOSIS — M25511 Pain in right shoulder: Secondary | ICD-10-CM | POA: Diagnosis not present

## 2019-02-07 DIAGNOSIS — M545 Low back pain: Secondary | ICD-10-CM | POA: Diagnosis not present

## 2019-02-07 DIAGNOSIS — M25512 Pain in left shoulder: Secondary | ICD-10-CM | POA: Diagnosis not present

## 2019-02-07 DIAGNOSIS — M6281 Muscle weakness (generalized): Secondary | ICD-10-CM | POA: Diagnosis not present

## 2019-02-09 DIAGNOSIS — M25511 Pain in right shoulder: Secondary | ICD-10-CM | POA: Diagnosis not present

## 2019-02-09 DIAGNOSIS — M25512 Pain in left shoulder: Secondary | ICD-10-CM | POA: Diagnosis not present

## 2019-02-09 DIAGNOSIS — M545 Low back pain: Secondary | ICD-10-CM | POA: Diagnosis not present

## 2019-02-09 DIAGNOSIS — M6281 Muscle weakness (generalized): Secondary | ICD-10-CM | POA: Diagnosis not present

## 2019-02-13 ENCOUNTER — Other Ambulatory Visit: Payer: Self-pay

## 2019-02-14 DIAGNOSIS — M545 Low back pain: Secondary | ICD-10-CM | POA: Diagnosis not present

## 2019-02-14 DIAGNOSIS — M6281 Muscle weakness (generalized): Secondary | ICD-10-CM | POA: Diagnosis not present

## 2019-02-14 DIAGNOSIS — M25512 Pain in left shoulder: Secondary | ICD-10-CM | POA: Diagnosis not present

## 2019-02-14 DIAGNOSIS — M25511 Pain in right shoulder: Secondary | ICD-10-CM | POA: Diagnosis not present

## 2019-02-15 DIAGNOSIS — M545 Low back pain: Secondary | ICD-10-CM | POA: Diagnosis not present

## 2019-02-15 DIAGNOSIS — M6281 Muscle weakness (generalized): Secondary | ICD-10-CM | POA: Diagnosis not present

## 2019-02-15 DIAGNOSIS — M25511 Pain in right shoulder: Secondary | ICD-10-CM | POA: Diagnosis not present

## 2019-02-15 DIAGNOSIS — M25512 Pain in left shoulder: Secondary | ICD-10-CM | POA: Diagnosis not present

## 2019-02-17 DIAGNOSIS — M25511 Pain in right shoulder: Secondary | ICD-10-CM | POA: Diagnosis not present

## 2019-02-17 DIAGNOSIS — M25512 Pain in left shoulder: Secondary | ICD-10-CM | POA: Diagnosis not present

## 2019-02-17 DIAGNOSIS — M545 Low back pain: Secondary | ICD-10-CM | POA: Diagnosis not present

## 2019-02-17 DIAGNOSIS — M6281 Muscle weakness (generalized): Secondary | ICD-10-CM | POA: Diagnosis not present

## 2019-02-20 DIAGNOSIS — M545 Low back pain: Secondary | ICD-10-CM | POA: Diagnosis not present

## 2019-02-20 DIAGNOSIS — M6281 Muscle weakness (generalized): Secondary | ICD-10-CM | POA: Diagnosis not present

## 2019-02-20 DIAGNOSIS — M25511 Pain in right shoulder: Secondary | ICD-10-CM | POA: Diagnosis not present

## 2019-02-20 DIAGNOSIS — M25512 Pain in left shoulder: Secondary | ICD-10-CM | POA: Diagnosis not present

## 2019-02-22 DIAGNOSIS — M545 Low back pain: Secondary | ICD-10-CM | POA: Diagnosis not present

## 2019-02-22 DIAGNOSIS — M6281 Muscle weakness (generalized): Secondary | ICD-10-CM | POA: Diagnosis not present

## 2019-02-22 DIAGNOSIS — M25512 Pain in left shoulder: Secondary | ICD-10-CM | POA: Diagnosis not present

## 2019-02-22 DIAGNOSIS — M25511 Pain in right shoulder: Secondary | ICD-10-CM | POA: Diagnosis not present

## 2019-02-23 DIAGNOSIS — M25512 Pain in left shoulder: Secondary | ICD-10-CM | POA: Diagnosis not present

## 2019-02-23 DIAGNOSIS — M545 Low back pain: Secondary | ICD-10-CM | POA: Diagnosis not present

## 2019-02-23 DIAGNOSIS — M6281 Muscle weakness (generalized): Secondary | ICD-10-CM | POA: Diagnosis not present

## 2019-02-23 DIAGNOSIS — M25511 Pain in right shoulder: Secondary | ICD-10-CM | POA: Diagnosis not present

## 2019-02-27 DIAGNOSIS — M25511 Pain in right shoulder: Secondary | ICD-10-CM | POA: Diagnosis not present

## 2019-02-27 DIAGNOSIS — M6281 Muscle weakness (generalized): Secondary | ICD-10-CM | POA: Diagnosis not present

## 2019-02-27 DIAGNOSIS — M545 Low back pain: Secondary | ICD-10-CM | POA: Diagnosis not present

## 2019-02-27 DIAGNOSIS — M25512 Pain in left shoulder: Secondary | ICD-10-CM | POA: Diagnosis not present

## 2019-02-28 DIAGNOSIS — M25512 Pain in left shoulder: Secondary | ICD-10-CM | POA: Diagnosis not present

## 2019-02-28 DIAGNOSIS — M545 Low back pain: Secondary | ICD-10-CM | POA: Diagnosis not present

## 2019-02-28 DIAGNOSIS — M6281 Muscle weakness (generalized): Secondary | ICD-10-CM | POA: Diagnosis not present

## 2019-02-28 DIAGNOSIS — M25511 Pain in right shoulder: Secondary | ICD-10-CM | POA: Diagnosis not present

## 2019-03-03 DIAGNOSIS — M25512 Pain in left shoulder: Secondary | ICD-10-CM | POA: Diagnosis not present

## 2019-03-03 DIAGNOSIS — M25511 Pain in right shoulder: Secondary | ICD-10-CM | POA: Diagnosis not present

## 2019-03-03 DIAGNOSIS — M545 Low back pain: Secondary | ICD-10-CM | POA: Diagnosis not present

## 2019-03-03 DIAGNOSIS — M6281 Muscle weakness (generalized): Secondary | ICD-10-CM | POA: Diagnosis not present

## 2019-03-06 DIAGNOSIS — M25512 Pain in left shoulder: Secondary | ICD-10-CM | POA: Diagnosis not present

## 2019-03-06 DIAGNOSIS — M25511 Pain in right shoulder: Secondary | ICD-10-CM | POA: Diagnosis not present

## 2019-03-06 DIAGNOSIS — M545 Low back pain: Secondary | ICD-10-CM | POA: Diagnosis not present

## 2019-03-06 DIAGNOSIS — M6281 Muscle weakness (generalized): Secondary | ICD-10-CM | POA: Diagnosis not present

## 2019-03-07 DIAGNOSIS — M545 Low back pain: Secondary | ICD-10-CM | POA: Diagnosis not present

## 2019-03-07 DIAGNOSIS — M6281 Muscle weakness (generalized): Secondary | ICD-10-CM | POA: Diagnosis not present

## 2019-03-07 DIAGNOSIS — M25511 Pain in right shoulder: Secondary | ICD-10-CM | POA: Diagnosis not present

## 2019-03-07 DIAGNOSIS — M25512 Pain in left shoulder: Secondary | ICD-10-CM | POA: Diagnosis not present

## 2019-03-09 DIAGNOSIS — M6281 Muscle weakness (generalized): Secondary | ICD-10-CM | POA: Diagnosis not present

## 2019-03-09 DIAGNOSIS — M25512 Pain in left shoulder: Secondary | ICD-10-CM | POA: Diagnosis not present

## 2019-03-09 DIAGNOSIS — M25511 Pain in right shoulder: Secondary | ICD-10-CM | POA: Diagnosis not present

## 2019-03-09 DIAGNOSIS — M545 Low back pain: Secondary | ICD-10-CM | POA: Diagnosis not present

## 2019-03-13 DIAGNOSIS — L821 Other seborrheic keratosis: Secondary | ICD-10-CM | POA: Diagnosis not present

## 2019-03-13 DIAGNOSIS — D692 Other nonthrombocytopenic purpura: Secondary | ICD-10-CM | POA: Diagnosis not present

## 2019-03-13 DIAGNOSIS — C4441 Basal cell carcinoma of skin of scalp and neck: Secondary | ICD-10-CM | POA: Diagnosis not present

## 2019-03-13 DIAGNOSIS — Z85828 Personal history of other malignant neoplasm of skin: Secondary | ICD-10-CM | POA: Diagnosis not present

## 2019-03-13 DIAGNOSIS — L570 Actinic keratosis: Secondary | ICD-10-CM | POA: Diagnosis not present

## 2019-03-13 DIAGNOSIS — C44519 Basal cell carcinoma of skin of other part of trunk: Secondary | ICD-10-CM | POA: Diagnosis not present

## 2019-03-14 DIAGNOSIS — M6281 Muscle weakness (generalized): Secondary | ICD-10-CM | POA: Diagnosis not present

## 2019-03-14 DIAGNOSIS — M545 Low back pain: Secondary | ICD-10-CM | POA: Diagnosis not present

## 2019-03-14 DIAGNOSIS — M25511 Pain in right shoulder: Secondary | ICD-10-CM | POA: Diagnosis not present

## 2019-03-14 DIAGNOSIS — M25512 Pain in left shoulder: Secondary | ICD-10-CM | POA: Diagnosis not present

## 2019-03-15 DIAGNOSIS — M545 Low back pain: Secondary | ICD-10-CM | POA: Diagnosis not present

## 2019-03-15 DIAGNOSIS — M6281 Muscle weakness (generalized): Secondary | ICD-10-CM | POA: Diagnosis not present

## 2019-03-15 DIAGNOSIS — M25512 Pain in left shoulder: Secondary | ICD-10-CM | POA: Diagnosis not present

## 2019-03-15 DIAGNOSIS — M25511 Pain in right shoulder: Secondary | ICD-10-CM | POA: Diagnosis not present

## 2019-03-16 DIAGNOSIS — M25511 Pain in right shoulder: Secondary | ICD-10-CM | POA: Diagnosis not present

## 2019-03-16 DIAGNOSIS — M25512 Pain in left shoulder: Secondary | ICD-10-CM | POA: Diagnosis not present

## 2019-03-16 DIAGNOSIS — M545 Low back pain: Secondary | ICD-10-CM | POA: Diagnosis not present

## 2019-03-16 DIAGNOSIS — M6281 Muscle weakness (generalized): Secondary | ICD-10-CM | POA: Diagnosis not present

## 2019-03-21 DIAGNOSIS — M25511 Pain in right shoulder: Secondary | ICD-10-CM | POA: Diagnosis not present

## 2019-03-21 DIAGNOSIS — M25512 Pain in left shoulder: Secondary | ICD-10-CM | POA: Diagnosis not present

## 2019-03-21 DIAGNOSIS — M545 Low back pain: Secondary | ICD-10-CM | POA: Diagnosis not present

## 2019-03-21 DIAGNOSIS — M6281 Muscle weakness (generalized): Secondary | ICD-10-CM | POA: Diagnosis not present

## 2019-03-22 DIAGNOSIS — M25511 Pain in right shoulder: Secondary | ICD-10-CM | POA: Diagnosis not present

## 2019-03-22 DIAGNOSIS — M6281 Muscle weakness (generalized): Secondary | ICD-10-CM | POA: Diagnosis not present

## 2019-03-22 DIAGNOSIS — M545 Low back pain: Secondary | ICD-10-CM | POA: Diagnosis not present

## 2019-03-22 DIAGNOSIS — M25512 Pain in left shoulder: Secondary | ICD-10-CM | POA: Diagnosis not present

## 2019-03-23 DIAGNOSIS — M25512 Pain in left shoulder: Secondary | ICD-10-CM | POA: Diagnosis not present

## 2019-03-23 DIAGNOSIS — M6281 Muscle weakness (generalized): Secondary | ICD-10-CM | POA: Diagnosis not present

## 2019-03-23 DIAGNOSIS — M25511 Pain in right shoulder: Secondary | ICD-10-CM | POA: Diagnosis not present

## 2019-03-23 DIAGNOSIS — M545 Low back pain: Secondary | ICD-10-CM | POA: Diagnosis not present

## 2019-03-27 DIAGNOSIS — M6281 Muscle weakness (generalized): Secondary | ICD-10-CM | POA: Diagnosis not present

## 2019-03-27 DIAGNOSIS — M25512 Pain in left shoulder: Secondary | ICD-10-CM | POA: Diagnosis not present

## 2019-03-27 DIAGNOSIS — M25511 Pain in right shoulder: Secondary | ICD-10-CM | POA: Diagnosis not present

## 2019-03-27 DIAGNOSIS — M545 Low back pain: Secondary | ICD-10-CM | POA: Diagnosis not present

## 2019-03-28 DIAGNOSIS — Z85828 Personal history of other malignant neoplasm of skin: Secondary | ICD-10-CM | POA: Diagnosis not present

## 2019-03-28 DIAGNOSIS — C4442 Squamous cell carcinoma of skin of scalp and neck: Secondary | ICD-10-CM | POA: Diagnosis not present

## 2019-04-06 DIAGNOSIS — M545 Low back pain: Secondary | ICD-10-CM | POA: Diagnosis not present

## 2019-04-06 DIAGNOSIS — M6281 Muscle weakness (generalized): Secondary | ICD-10-CM | POA: Diagnosis not present

## 2019-04-06 DIAGNOSIS — M25512 Pain in left shoulder: Secondary | ICD-10-CM | POA: Diagnosis not present

## 2019-04-06 DIAGNOSIS — M25511 Pain in right shoulder: Secondary | ICD-10-CM | POA: Diagnosis not present

## 2019-04-07 DIAGNOSIS — M6281 Muscle weakness (generalized): Secondary | ICD-10-CM | POA: Diagnosis not present

## 2019-04-07 DIAGNOSIS — M545 Low back pain: Secondary | ICD-10-CM | POA: Diagnosis not present

## 2019-04-07 DIAGNOSIS — M25512 Pain in left shoulder: Secondary | ICD-10-CM | POA: Diagnosis not present

## 2019-04-07 DIAGNOSIS — M25511 Pain in right shoulder: Secondary | ICD-10-CM | POA: Diagnosis not present

## 2019-04-11 DIAGNOSIS — M25511 Pain in right shoulder: Secondary | ICD-10-CM | POA: Diagnosis not present

## 2019-04-11 DIAGNOSIS — M6281 Muscle weakness (generalized): Secondary | ICD-10-CM | POA: Diagnosis not present

## 2019-04-11 DIAGNOSIS — M25512 Pain in left shoulder: Secondary | ICD-10-CM | POA: Diagnosis not present

## 2019-04-11 DIAGNOSIS — M545 Low back pain: Secondary | ICD-10-CM | POA: Diagnosis not present

## 2019-04-12 DIAGNOSIS — M25511 Pain in right shoulder: Secondary | ICD-10-CM | POA: Diagnosis not present

## 2019-04-12 DIAGNOSIS — M545 Low back pain: Secondary | ICD-10-CM | POA: Diagnosis not present

## 2019-04-12 DIAGNOSIS — M25512 Pain in left shoulder: Secondary | ICD-10-CM | POA: Diagnosis not present

## 2019-04-12 DIAGNOSIS — M6281 Muscle weakness (generalized): Secondary | ICD-10-CM | POA: Diagnosis not present

## 2019-06-01 DIAGNOSIS — I129 Hypertensive chronic kidney disease with stage 1 through stage 4 chronic kidney disease, or unspecified chronic kidney disease: Secondary | ICD-10-CM | POA: Diagnosis not present

## 2019-06-01 DIAGNOSIS — E7800 Pure hypercholesterolemia, unspecified: Secondary | ICD-10-CM | POA: Diagnosis not present

## 2019-06-01 DIAGNOSIS — E559 Vitamin D deficiency, unspecified: Secondary | ICD-10-CM | POA: Diagnosis not present

## 2019-06-01 DIAGNOSIS — E039 Hypothyroidism, unspecified: Secondary | ICD-10-CM | POA: Diagnosis not present

## 2019-06-06 DIAGNOSIS — E559 Vitamin D deficiency, unspecified: Secondary | ICD-10-CM | POA: Diagnosis not present

## 2019-06-06 DIAGNOSIS — Z0000 Encounter for general adult medical examination without abnormal findings: Secondary | ICD-10-CM | POA: Diagnosis not present

## 2019-06-06 DIAGNOSIS — E039 Hypothyroidism, unspecified: Secondary | ICD-10-CM | POA: Diagnosis not present

## 2019-06-06 DIAGNOSIS — Z23 Encounter for immunization: Secondary | ICD-10-CM | POA: Diagnosis not present

## 2019-06-06 DIAGNOSIS — K5909 Other constipation: Secondary | ICD-10-CM | POA: Diagnosis not present

## 2019-09-07 DIAGNOSIS — C44319 Basal cell carcinoma of skin of other parts of face: Secondary | ICD-10-CM | POA: Diagnosis not present

## 2019-09-07 DIAGNOSIS — D1801 Hemangioma of skin and subcutaneous tissue: Secondary | ICD-10-CM | POA: Diagnosis not present

## 2019-09-07 DIAGNOSIS — L821 Other seborrheic keratosis: Secondary | ICD-10-CM | POA: Diagnosis not present

## 2019-09-07 DIAGNOSIS — L820 Inflamed seborrheic keratosis: Secondary | ICD-10-CM | POA: Diagnosis not present

## 2019-09-07 DIAGNOSIS — Z85828 Personal history of other malignant neoplasm of skin: Secondary | ICD-10-CM | POA: Diagnosis not present

## 2019-09-16 ENCOUNTER — Ambulatory Visit: Payer: Medicare Other | Attending: Internal Medicine

## 2019-09-16 DIAGNOSIS — Z23 Encounter for immunization: Secondary | ICD-10-CM | POA: Insufficient documentation

## 2019-09-16 NOTE — Progress Notes (Signed)
   Covid-19 Vaccination Clinic  Name:  Kimberly Frye    MRN: [id number] DOB: August 14, 1932  09/16/2019  [name] was observed post Covid-19 immunization for 15 minutes without incident. She was provided with Vaccine Information Sheet and instruction to access the V-Safe system.   [name] was instructed to call 911 with any severe reactions post vaccine: Marland Kitchen Difficulty breathing  . Swelling of face and throat  . A fast heartbeat  . A bad rash all over body  . Dizziness and weakness   Immunizations Administered    Name Date Dose VIS Date Route   Pfizer COVID-19 Vaccine 09/16/2019 12:30 PM 0.3 mL 06/23/2019 Intramuscular   Manufacturer: [place]   Lot: [id number]   [place]: [id number]

## 2019-09-20 DIAGNOSIS — C44319 Basal cell carcinoma of skin of other parts of face: Secondary | ICD-10-CM | POA: Diagnosis not present

## 2019-09-20 DIAGNOSIS — Z85828 Personal history of other malignant neoplasm of skin: Secondary | ICD-10-CM | POA: Diagnosis not present

## 2019-10-07 ENCOUNTER — Ambulatory Visit: Payer: Medicare Other | Attending: Internal Medicine

## 2019-10-07 DIAGNOSIS — Z23 Encounter for immunization: Secondary | ICD-10-CM

## 2019-10-07 NOTE — Progress Notes (Signed)
   Covid-19 Vaccination Clinic  Name:  Kimberly Frye    MRN: [id number] DOB: 07/11/1933  10/07/2019  [name] was observed post Covid-19 immunization for 15 minutes without incident. She was provided with Vaccine Information Sheet and instruction to access the V-Safe system.   [name] was instructed to call 911 with any severe reactions post vaccine: Marland Kitchen Difficulty breathing  . Swelling of face and throat  . A fast heartbeat  . A bad rash all over body  . Dizziness and weakness   Immunizations Administered    Name Date Dose VIS Date Route   Pfizer COVID-19 Vaccine 10/07/2019 11:42 AM 0.3 mL 06/23/2019 Intramuscular   Manufacturer: [place]   Lot: [id number]   [place]: [id number]

## 2019-11-09 DIAGNOSIS — H52223 Regular astigmatism, bilateral: Secondary | ICD-10-CM | POA: Diagnosis not present

## 2019-11-09 DIAGNOSIS — H524 Presbyopia: Secondary | ICD-10-CM | POA: Diagnosis not present

## 2019-11-09 DIAGNOSIS — H5203 Hypermetropia, bilateral: Secondary | ICD-10-CM | POA: Diagnosis not present

## 2019-11-09 DIAGNOSIS — Z961 Presence of intraocular lens: Secondary | ICD-10-CM | POA: Diagnosis not present

## 2020-03-08 DIAGNOSIS — L821 Other seborrheic keratosis: Secondary | ICD-10-CM | POA: Diagnosis not present

## 2020-03-08 DIAGNOSIS — L814 Other melanin hyperpigmentation: Secondary | ICD-10-CM | POA: Diagnosis not present

## 2020-03-08 DIAGNOSIS — D1801 Hemangioma of skin and subcutaneous tissue: Secondary | ICD-10-CM | POA: Diagnosis not present

## 2020-03-08 DIAGNOSIS — D2262 Melanocytic nevi of left upper limb, including shoulder: Secondary | ICD-10-CM | POA: Diagnosis not present

## 2020-03-08 DIAGNOSIS — Z85828 Personal history of other malignant neoplasm of skin: Secondary | ICD-10-CM | POA: Diagnosis not present

## 2020-03-08 DIAGNOSIS — L820 Inflamed seborrheic keratosis: Secondary | ICD-10-CM | POA: Diagnosis not present

## 2020-06-05 DIAGNOSIS — E785 Hyperlipidemia, unspecified: Secondary | ICD-10-CM | POA: Diagnosis not present

## 2020-06-05 DIAGNOSIS — I10 Essential (primary) hypertension: Secondary | ICD-10-CM | POA: Diagnosis not present

## 2020-06-05 DIAGNOSIS — E559 Vitamin D deficiency, unspecified: Secondary | ICD-10-CM | POA: Diagnosis not present

## 2020-06-05 DIAGNOSIS — I129 Hypertensive chronic kidney disease with stage 1 through stage 4 chronic kidney disease, or unspecified chronic kidney disease: Secondary | ICD-10-CM | POA: Diagnosis not present

## 2020-06-05 DIAGNOSIS — E7800 Pure hypercholesterolemia, unspecified: Secondary | ICD-10-CM | POA: Diagnosis not present

## 2020-06-05 DIAGNOSIS — Z0000 Encounter for general adult medical examination without abnormal findings: Secondary | ICD-10-CM | POA: Diagnosis not present

## 2020-06-05 DIAGNOSIS — E039 Hypothyroidism, unspecified: Secondary | ICD-10-CM | POA: Diagnosis not present

## 2020-07-04 DIAGNOSIS — L089 Local infection of the skin and subcutaneous tissue, unspecified: Secondary | ICD-10-CM | POA: Diagnosis not present

## 2020-07-11 DIAGNOSIS — E039 Hypothyroidism, unspecified: Secondary | ICD-10-CM | POA: Diagnosis not present

## 2020-07-11 DIAGNOSIS — Z0000 Encounter for general adult medical examination without abnormal findings: Secondary | ICD-10-CM | POA: Diagnosis not present

## 2020-07-11 DIAGNOSIS — L03115 Cellulitis of right lower limb: Secondary | ICD-10-CM | POA: Diagnosis not present

## 2020-07-11 DIAGNOSIS — E559 Vitamin D deficiency, unspecified: Secondary | ICD-10-CM | POA: Diagnosis not present

## 2020-07-11 DIAGNOSIS — F418 Other specified anxiety disorders: Secondary | ICD-10-CM | POA: Diagnosis not present

## 2020-07-11 DIAGNOSIS — I700 Atherosclerosis of aorta: Secondary | ICD-10-CM | POA: Diagnosis not present

## 2020-07-18 DIAGNOSIS — L03115 Cellulitis of right lower limb: Secondary | ICD-10-CM | POA: Diagnosis not present

## 2020-09-09 DIAGNOSIS — L821 Other seborrheic keratosis: Secondary | ICD-10-CM | POA: Diagnosis not present

## 2020-09-09 DIAGNOSIS — L814 Other melanin hyperpigmentation: Secondary | ICD-10-CM | POA: Diagnosis not present

## 2020-09-09 DIAGNOSIS — Z85828 Personal history of other malignant neoplasm of skin: Secondary | ICD-10-CM | POA: Diagnosis not present

## 2020-09-09 DIAGNOSIS — L570 Actinic keratosis: Secondary | ICD-10-CM | POA: Diagnosis not present

## 2020-09-09 DIAGNOSIS — D0362 Melanoma in situ of left upper limb, including shoulder: Secondary | ICD-10-CM | POA: Diagnosis not present

## 2020-10-08 DIAGNOSIS — D0362 Melanoma in situ of left upper limb, including shoulder: Secondary | ICD-10-CM | POA: Diagnosis not present

## 2020-10-08 DIAGNOSIS — Z85828 Personal history of other malignant neoplasm of skin: Secondary | ICD-10-CM | POA: Diagnosis not present

## 2020-10-21 ENCOUNTER — Other Ambulatory Visit: Payer: Self-pay

## 2020-10-21 ENCOUNTER — Ambulatory Visit (INDEPENDENT_AMBULATORY_CARE_PROVIDER_SITE_OTHER): Payer: Medicare Other

## 2020-10-21 ENCOUNTER — Ambulatory Visit (INDEPENDENT_AMBULATORY_CARE_PROVIDER_SITE_OTHER): Payer: Medicare Other | Admitting: Family Medicine

## 2020-10-21 DIAGNOSIS — Z85828 Personal history of other malignant neoplasm of skin: Secondary | ICD-10-CM | POA: Insufficient documentation

## 2020-10-21 DIAGNOSIS — E559 Vitamin D deficiency, unspecified: Secondary | ICD-10-CM | POA: Insufficient documentation

## 2020-10-21 DIAGNOSIS — E785 Hyperlipidemia, unspecified: Secondary | ICD-10-CM | POA: Insufficient documentation

## 2020-10-21 DIAGNOSIS — I700 Atherosclerosis of aorta: Secondary | ICD-10-CM | POA: Insufficient documentation

## 2020-10-21 DIAGNOSIS — M545 Low back pain, unspecified: Secondary | ICD-10-CM

## 2020-10-21 DIAGNOSIS — F418 Other specified anxiety disorders: Secondary | ICD-10-CM | POA: Insufficient documentation

## 2020-10-21 DIAGNOSIS — Z720 Tobacco use: Secondary | ICD-10-CM | POA: Insufficient documentation

## 2020-10-21 DIAGNOSIS — F419 Anxiety disorder, unspecified: Secondary | ICD-10-CM | POA: Insufficient documentation

## 2020-10-21 DIAGNOSIS — E039 Hypothyroidism, unspecified: Secondary | ICD-10-CM | POA: Insufficient documentation

## 2020-10-21 DIAGNOSIS — J309 Allergic rhinitis, unspecified: Secondary | ICD-10-CM | POA: Insufficient documentation

## 2020-10-21 DIAGNOSIS — R1011 Right upper quadrant pain: Secondary | ICD-10-CM

## 2020-10-21 DIAGNOSIS — E7800 Pure hypercholesterolemia, unspecified: Secondary | ICD-10-CM | POA: Insufficient documentation

## 2020-10-21 DIAGNOSIS — I129 Hypertensive chronic kidney disease with stage 1 through stage 4 chronic kidney disease, or unspecified chronic kidney disease: Secondary | ICD-10-CM | POA: Insufficient documentation

## 2020-10-21 DIAGNOSIS — N951 Menopausal and female climacteric states: Secondary | ICD-10-CM | POA: Insufficient documentation

## 2020-10-21 DIAGNOSIS — F4320 Adjustment disorder, unspecified: Secondary | ICD-10-CM | POA: Insufficient documentation

## 2020-10-21 DIAGNOSIS — I10 Essential (primary) hypertension: Secondary | ICD-10-CM | POA: Insufficient documentation

## 2020-10-21 MED ORDER — TRAMADOL HCL 50 MG PO TABS: 25.0000 mg | ORAL_TABLET | Freq: Four times a day (QID) | ORAL | 0 refills | Status: DC | PRN

## 2020-10-21 NOTE — Progress Notes (Signed)
Office Visit Note   Patient: Kimberly Frye           Date of Birth: Nov 27, 1932           MRN: [id number] Visit Date: 10/21/2020 Requested by: No referring provider defined for this encounter. PCP: No primary care provider on file.  Subjective: Chief Complaint  Patient presents with  . Lower Back - Pain    Right lower back pain post fall on 10/19/20. Got up from the lounge chair she was sleeping in (around 2:00 am) to go to the bathroom. She fell and hit the right lower back on a pointy section of the coffee table. Has been having a "grabbing pain" in that area. ES Tylenol has allowed her to sleep, but has now run out. She has some bruising on the right buttock and lateral right hip.    HPI: She is here with right posterior lateral hip pain.  2 days ago she fell at home after getting up to go to the bathroom.  She thinks she hit a coffee table.  She was able to get up and walk, but she has had "grabbing pain" in the area since then and has been unable to sleep despite taking 2 Tylenol.  She has no appetite, and does not drinking much either.  She has not been able to go to the bathroom.               ROS:   All other systems were reviewed and are negative.  Objective: Vital Signs: There were no vitals taken for this visit.  Physical Exam:  General:  Alert and oriented, in no acute distress. Pulm:  Breathing unlabored. Psy:  Normal mood, congruent affect. Skin: There is significant bruising lateral right hip near the iliac crest. Abdomen: She is very tender in the right upper quadrant and in the right flank area. Right hip: She has no pain with internal rotation and no tenderness over the greater trochanter. Low back: No tenderness palpation along the lumbar spinous processes.  No tenderness over the SI joint.    Imaging: XR Lumbar Spine 2-3 Views  Result Date: 10/21/2020 X-rays reveal degenerative change with no obvious acute fracture.   Assessment & Plan: 1.  2 days  status post fall with right side contusion -CT abdomen and pelvis to rule out internal organ injury.     Procedures: No procedures performed        PMFS History: Patient Active Problem List   Diagnosis Date Noted  . Abdominal aortic atherosclerosis ([place]) 10/21/2020  . Adjustment disorder 10/21/2020  . Allergic rhinitis 10/21/2020  . Chronic kidney disease due to hypertension 10/21/2020  . Essential hypertension 10/21/2020  . History of malignant neoplasm of skin 10/21/2020  . Hyperlipidemia 10/21/2020  . Hypothyroidism 10/21/2020  . Menopausal flushing 10/21/2020  . Mild anxiety 10/21/2020  . Mixed anxiety and depressive disorder 10/21/2020  . Pure hypercholesterolemia 10/21/2020  . Tobacco user 10/21/2020  . Vitamin D deficiency 10/21/2020   No past medical history on file.  No family history on file.  No past surgical history on file. Social History   Occupational History  . Not on file  Tobacco Use  . Smoking status: Not on file  . Smokeless tobacco: Not on file  Substance and Sexual Activity  . Alcohol use: Not on file  . Drug use: Not on file  . Sexual activity: Not on file

## 2020-10-21 NOTE — Addendum Note (Signed)
Addended by: Hortencia Pilar on: 10/21/2020 02:44 PM   Modules accepted: Orders

## 2020-10-23 ENCOUNTER — Telehealth: Payer: Self-pay

## 2020-10-23 ENCOUNTER — Ambulatory Visit
Admission: RE | Admit: 2020-10-23 | Discharge: 2020-10-23 | Disposition: A | Discharge status: Home / Self Care | Payer: Medicare Other | Source: Ambulatory Visit | Attending: Family Medicine | Admitting: Family Medicine

## 2020-10-23 ENCOUNTER — Telehealth: Payer: Self-pay | Admitting: Family Medicine

## 2020-10-23 DIAGNOSIS — M545 Low back pain, unspecified: Secondary | ICD-10-CM

## 2020-10-23 DIAGNOSIS — R109 Unspecified abdominal pain: Secondary | ICD-10-CM | POA: Diagnosis not present

## 2020-10-23 DIAGNOSIS — R1011 Right upper quadrant pain: Secondary | ICD-10-CM

## 2020-10-23 DIAGNOSIS — K5730 Diverticulosis of large intestine without perforation or abscess without bleeding: Secondary | ICD-10-CM | POA: Diagnosis not present

## 2020-10-23 DIAGNOSIS — K8080 Other cholelithiasis without obstruction: Secondary | ICD-10-CM | POA: Diagnosis not present

## 2020-10-23 DIAGNOSIS — N200 Calculus of kidney: Secondary | ICD-10-CM | POA: Diagnosis not present

## 2020-10-23 NOTE — Telephone Encounter (Signed)
CT shows no fractures, no organ damage.  There's soft tissue bruising from the fall which should subside in a week or two.

## 2020-10-23 NOTE — Telephone Encounter (Signed)
The patient did receive the voice mail. She called back with more questions - see other message form today regarding this.

## 2020-10-23 NOTE — Telephone Encounter (Signed)
Pt called with a few more questions for Dr. Junius Roads and would like a call back at his earliest convince.

## 2020-10-24 MED ORDER — DICLOFENAC SODIUM 1 % EX GEL: 4.0000 g | Freq: Four times a day (QID) | CUTANEOUS | 6 refills | Status: DC | PRN

## 2021-01-07 DIAGNOSIS — I700 Atherosclerosis of aorta: Secondary | ICD-10-CM | POA: Diagnosis not present

## 2021-01-07 DIAGNOSIS — E559 Vitamin D deficiency, unspecified: Secondary | ICD-10-CM | POA: Diagnosis not present

## 2021-01-07 DIAGNOSIS — J309 Allergic rhinitis, unspecified: Secondary | ICD-10-CM | POA: Diagnosis not present

## 2021-01-07 DIAGNOSIS — E039 Hypothyroidism, unspecified: Secondary | ICD-10-CM | POA: Diagnosis not present

## 2021-03-10 DIAGNOSIS — L814 Other melanin hyperpigmentation: Secondary | ICD-10-CM | POA: Diagnosis not present

## 2021-03-10 DIAGNOSIS — D225 Melanocytic nevi of trunk: Secondary | ICD-10-CM | POA: Diagnosis not present

## 2021-03-10 DIAGNOSIS — Z85820 Personal history of malignant melanoma of skin: Secondary | ICD-10-CM | POA: Diagnosis not present

## 2021-03-10 DIAGNOSIS — D2272 Melanocytic nevi of left lower limb, including hip: Secondary | ICD-10-CM | POA: Diagnosis not present

## 2021-03-10 DIAGNOSIS — D1801 Hemangioma of skin and subcutaneous tissue: Secondary | ICD-10-CM | POA: Diagnosis not present

## 2021-03-10 DIAGNOSIS — L821 Other seborrheic keratosis: Secondary | ICD-10-CM | POA: Diagnosis not present

## 2021-03-10 DIAGNOSIS — Z85828 Personal history of other malignant neoplasm of skin: Secondary | ICD-10-CM | POA: Diagnosis not present

## 2021-03-10 DIAGNOSIS — L570 Actinic keratosis: Secondary | ICD-10-CM | POA: Diagnosis not present

## 2021-03-10 DIAGNOSIS — C44519 Basal cell carcinoma of skin of other part of trunk: Secondary | ICD-10-CM | POA: Diagnosis not present

## 2021-07-17 DIAGNOSIS — E7800 Pure hypercholesterolemia, unspecified: Secondary | ICD-10-CM | POA: Diagnosis not present

## 2021-07-17 DIAGNOSIS — E559 Vitamin D deficiency, unspecified: Secondary | ICD-10-CM | POA: Diagnosis not present

## 2021-07-17 DIAGNOSIS — E039 Hypothyroidism, unspecified: Secondary | ICD-10-CM | POA: Diagnosis not present

## 2021-07-17 DIAGNOSIS — Z0000 Encounter for general adult medical examination without abnormal findings: Secondary | ICD-10-CM | POA: Diagnosis not present

## 2021-07-17 DIAGNOSIS — R32 Unspecified urinary incontinence: Secondary | ICD-10-CM | POA: Diagnosis not present

## 2021-07-17 DIAGNOSIS — E785 Hyperlipidemia, unspecified: Secondary | ICD-10-CM | POA: Diagnosis not present

## 2021-07-21 DIAGNOSIS — J320 Chronic maxillary sinusitis: Secondary | ICD-10-CM | POA: Diagnosis not present

## 2021-07-21 DIAGNOSIS — Z0000 Encounter for general adult medical examination without abnormal findings: Secondary | ICD-10-CM | POA: Diagnosis not present

## 2021-07-21 DIAGNOSIS — M8589 Other specified disorders of bone density and structure, multiple sites: Secondary | ICD-10-CM | POA: Diagnosis not present

## 2021-07-21 DIAGNOSIS — Z79890 Hormone replacement therapy (postmenopausal): Secondary | ICD-10-CM | POA: Diagnosis not present

## 2021-07-21 DIAGNOSIS — E039 Hypothyroidism, unspecified: Secondary | ICD-10-CM | POA: Diagnosis not present

## 2021-07-21 DIAGNOSIS — E559 Vitamin D deficiency, unspecified: Secondary | ICD-10-CM | POA: Diagnosis not present

## 2021-07-21 DIAGNOSIS — Z23 Encounter for immunization: Secondary | ICD-10-CM | POA: Diagnosis not present

## 2021-07-21 DIAGNOSIS — M85851 Other specified disorders of bone density and structure, right thigh: Secondary | ICD-10-CM | POA: Diagnosis not present

## 2021-07-21 DIAGNOSIS — M85852 Other specified disorders of bone density and structure, left thigh: Secondary | ICD-10-CM | POA: Diagnosis not present

## 2021-09-08 DIAGNOSIS — Z85828 Personal history of other malignant neoplasm of skin: Secondary | ICD-10-CM | POA: Diagnosis not present

## 2021-09-08 DIAGNOSIS — D485 Neoplasm of uncertain behavior of skin: Secondary | ICD-10-CM | POA: Diagnosis not present

## 2021-09-08 DIAGNOSIS — Z85820 Personal history of malignant melanoma of skin: Secondary | ICD-10-CM | POA: Diagnosis not present

## 2021-09-08 DIAGNOSIS — D1801 Hemangioma of skin and subcutaneous tissue: Secondary | ICD-10-CM | POA: Diagnosis not present

## 2021-09-08 DIAGNOSIS — L814 Other melanin hyperpigmentation: Secondary | ICD-10-CM | POA: Diagnosis not present

## 2021-09-08 DIAGNOSIS — D692 Other nonthrombocytopenic purpura: Secondary | ICD-10-CM | POA: Diagnosis not present

## 2021-09-08 DIAGNOSIS — D2272 Melanocytic nevi of left lower limb, including hip: Secondary | ICD-10-CM | POA: Diagnosis not present

## 2021-12-23 DIAGNOSIS — I10 Essential (primary) hypertension: Secondary | ICD-10-CM | POA: Diagnosis not present

## 2021-12-23 DIAGNOSIS — I700 Atherosclerosis of aorta: Secondary | ICD-10-CM | POA: Diagnosis not present

## 2021-12-23 DIAGNOSIS — E559 Vitamin D deficiency, unspecified: Secondary | ICD-10-CM | POA: Diagnosis not present

## 2021-12-23 DIAGNOSIS — E785 Hyperlipidemia, unspecified: Secondary | ICD-10-CM | POA: Diagnosis not present

## 2021-12-23 DIAGNOSIS — E039 Hypothyroidism, unspecified: Secondary | ICD-10-CM | POA: Diagnosis not present

## 2022-03-09 DIAGNOSIS — D692 Other nonthrombocytopenic purpura: Secondary | ICD-10-CM | POA: Diagnosis not present

## 2022-03-09 DIAGNOSIS — Z85820 Personal history of malignant melanoma of skin: Secondary | ICD-10-CM | POA: Diagnosis not present

## 2022-03-09 DIAGNOSIS — L814 Other melanin hyperpigmentation: Secondary | ICD-10-CM | POA: Diagnosis not present

## 2022-03-09 DIAGNOSIS — C44319 Basal cell carcinoma of skin of other parts of face: Secondary | ICD-10-CM | POA: Diagnosis not present

## 2022-03-09 DIAGNOSIS — L821 Other seborrheic keratosis: Secondary | ICD-10-CM | POA: Diagnosis not present

## 2022-03-09 DIAGNOSIS — Z85828 Personal history of other malignant neoplasm of skin: Secondary | ICD-10-CM | POA: Diagnosis not present

## 2022-04-14 DIAGNOSIS — C44319 Basal cell carcinoma of skin of other parts of face: Secondary | ICD-10-CM | POA: Diagnosis not present

## 2022-04-14 DIAGNOSIS — Z85828 Personal history of other malignant neoplasm of skin: Secondary | ICD-10-CM | POA: Diagnosis not present

## 2022-07-24 DIAGNOSIS — Z0000 Encounter for general adult medical examination without abnormal findings: Secondary | ICD-10-CM | POA: Diagnosis not present

## 2022-07-24 DIAGNOSIS — E7800 Pure hypercholesterolemia, unspecified: Secondary | ICD-10-CM | POA: Diagnosis not present

## 2022-07-24 DIAGNOSIS — E559 Vitamin D deficiency, unspecified: Secondary | ICD-10-CM | POA: Diagnosis not present

## 2022-07-24 DIAGNOSIS — E039 Hypothyroidism, unspecified: Secondary | ICD-10-CM | POA: Diagnosis not present

## 2022-07-28 DIAGNOSIS — E559 Vitamin D deficiency, unspecified: Secondary | ICD-10-CM | POA: Diagnosis not present

## 2022-07-28 DIAGNOSIS — L03116 Cellulitis of left lower limb: Secondary | ICD-10-CM | POA: Diagnosis not present

## 2022-07-28 DIAGNOSIS — Z0000 Encounter for general adult medical examination without abnormal findings: Secondary | ICD-10-CM | POA: Diagnosis not present

## 2022-07-28 DIAGNOSIS — I700 Atherosclerosis of aorta: Secondary | ICD-10-CM | POA: Diagnosis not present

## 2022-07-28 DIAGNOSIS — F331 Major depressive disorder, recurrent, moderate: Secondary | ICD-10-CM | POA: Diagnosis not present

## 2022-07-28 DIAGNOSIS — S81812A Laceration without foreign body, left lower leg, initial encounter: Secondary | ICD-10-CM | POA: Diagnosis not present

## 2022-07-30 DIAGNOSIS — L03116 Cellulitis of left lower limb: Secondary | ICD-10-CM | POA: Diagnosis not present

## 2022-07-30 DIAGNOSIS — R413 Other amnesia: Secondary | ICD-10-CM | POA: Diagnosis not present

## 2022-07-30 DIAGNOSIS — F323 Major depressive disorder, single episode, severe with psychotic features: Secondary | ICD-10-CM | POA: Diagnosis not present

## 2022-08-04 DIAGNOSIS — R413 Other amnesia: Secondary | ICD-10-CM | POA: Diagnosis not present

## 2022-08-04 DIAGNOSIS — L03116 Cellulitis of left lower limb: Secondary | ICD-10-CM | POA: Diagnosis not present

## 2022-08-04 DIAGNOSIS — F323 Major depressive disorder, single episode, severe with psychotic features: Secondary | ICD-10-CM | POA: Diagnosis not present

## 2022-08-10 DIAGNOSIS — R413 Other amnesia: Secondary | ICD-10-CM | POA: Diagnosis not present

## 2022-08-10 DIAGNOSIS — L03116 Cellulitis of left lower limb: Secondary | ICD-10-CM | POA: Diagnosis not present

## 2022-08-20 DIAGNOSIS — L03116 Cellulitis of left lower limb: Secondary | ICD-10-CM | POA: Diagnosis not present

## 2022-08-27 DIAGNOSIS — L03116 Cellulitis of left lower limb: Secondary | ICD-10-CM | POA: Diagnosis not present

## 2022-08-27 DIAGNOSIS — F419 Anxiety disorder, unspecified: Secondary | ICD-10-CM | POA: Diagnosis not present

## 2022-10-12 ENCOUNTER — Encounter: Payer: Self-pay | Admitting: Registered Nurse

## 2022-10-12 DIAGNOSIS — F411 Generalized anxiety disorder: Secondary | ICD-10-CM | POA: Diagnosis not present

## 2022-10-12 DIAGNOSIS — L03116 Cellulitis of left lower limb: Secondary | ICD-10-CM | POA: Diagnosis not present

## 2022-10-12 DIAGNOSIS — R600 Localized edema: Secondary | ICD-10-CM | POA: Diagnosis not present

## 2022-10-13 ENCOUNTER — Other Ambulatory Visit: Payer: Self-pay | Admitting: Registered Nurse

## 2022-10-13 DIAGNOSIS — R600 Localized edema: Secondary | ICD-10-CM

## 2022-10-14 DIAGNOSIS — R600 Localized edema: Secondary | ICD-10-CM | POA: Diagnosis not present

## 2022-10-15 DIAGNOSIS — E785 Hyperlipidemia, unspecified: Secondary | ICD-10-CM | POA: Diagnosis not present

## 2022-10-15 DIAGNOSIS — K5904 Chronic idiopathic constipation: Secondary | ICD-10-CM | POA: Diagnosis not present

## 2022-10-15 DIAGNOSIS — E039 Hypothyroidism, unspecified: Secondary | ICD-10-CM | POA: Diagnosis not present

## 2022-10-15 DIAGNOSIS — Z90710 Acquired absence of both cervix and uterus: Secondary | ICD-10-CM | POA: Diagnosis not present

## 2022-10-15 DIAGNOSIS — F411 Generalized anxiety disorder: Secondary | ICD-10-CM | POA: Diagnosis not present

## 2022-10-15 DIAGNOSIS — E559 Vitamin D deficiency, unspecified: Secondary | ICD-10-CM | POA: Diagnosis not present

## 2022-10-15 DIAGNOSIS — L03116 Cellulitis of left lower limb: Secondary | ICD-10-CM | POA: Diagnosis not present

## 2022-10-15 DIAGNOSIS — I10 Essential (primary) hypertension: Secondary | ICD-10-CM | POA: Diagnosis not present

## 2022-10-15 DIAGNOSIS — Z87891 Personal history of nicotine dependence: Secondary | ICD-10-CM | POA: Diagnosis not present

## 2022-10-15 DIAGNOSIS — Z9181 History of falling: Secondary | ICD-10-CM | POA: Diagnosis not present

## 2022-10-15 DIAGNOSIS — Z792 Long term (current) use of antibiotics: Secondary | ICD-10-CM | POA: Diagnosis not present

## 2022-10-20 DIAGNOSIS — I10 Essential (primary) hypertension: Secondary | ICD-10-CM | POA: Diagnosis not present

## 2022-10-20 DIAGNOSIS — F411 Generalized anxiety disorder: Secondary | ICD-10-CM | POA: Diagnosis not present

## 2022-10-20 DIAGNOSIS — R600 Localized edema: Secondary | ICD-10-CM | POA: Diagnosis not present

## 2022-10-20 DIAGNOSIS — E039 Hypothyroidism, unspecified: Secondary | ICD-10-CM | POA: Diagnosis not present

## 2022-10-20 DIAGNOSIS — L03116 Cellulitis of left lower limb: Secondary | ICD-10-CM | POA: Diagnosis not present

## 2022-10-22 DIAGNOSIS — E039 Hypothyroidism, unspecified: Secondary | ICD-10-CM | POA: Diagnosis not present

## 2022-10-22 DIAGNOSIS — E559 Vitamin D deficiency, unspecified: Secondary | ICD-10-CM | POA: Diagnosis not present

## 2022-10-22 DIAGNOSIS — F411 Generalized anxiety disorder: Secondary | ICD-10-CM | POA: Diagnosis not present

## 2022-10-22 DIAGNOSIS — E785 Hyperlipidemia, unspecified: Secondary | ICD-10-CM | POA: Diagnosis not present

## 2022-10-22 DIAGNOSIS — I10 Essential (primary) hypertension: Secondary | ICD-10-CM | POA: Diagnosis not present

## 2022-10-22 DIAGNOSIS — L03116 Cellulitis of left lower limb: Secondary | ICD-10-CM | POA: Diagnosis not present

## 2022-10-29 DIAGNOSIS — E559 Vitamin D deficiency, unspecified: Secondary | ICD-10-CM | POA: Diagnosis not present

## 2022-10-29 DIAGNOSIS — L03116 Cellulitis of left lower limb: Secondary | ICD-10-CM | POA: Diagnosis not present

## 2022-10-29 DIAGNOSIS — E785 Hyperlipidemia, unspecified: Secondary | ICD-10-CM | POA: Diagnosis not present

## 2022-10-29 DIAGNOSIS — I10 Essential (primary) hypertension: Secondary | ICD-10-CM | POA: Diagnosis not present

## 2022-10-29 DIAGNOSIS — F411 Generalized anxiety disorder: Secondary | ICD-10-CM | POA: Diagnosis not present

## 2022-10-29 DIAGNOSIS — E039 Hypothyroidism, unspecified: Secondary | ICD-10-CM | POA: Diagnosis not present

## 2022-11-03 ENCOUNTER — Encounter (HOSPITAL_BASED_OUTPATIENT_CLINIC_OR_DEPARTMENT_OTHER): Payer: Medicare Other | Attending: General Surgery | Admitting: Internal Medicine

## 2022-11-03 DIAGNOSIS — L03116 Cellulitis of left lower limb: Secondary | ICD-10-CM | POA: Diagnosis not present

## 2022-11-03 DIAGNOSIS — N189 Chronic kidney disease, unspecified: Secondary | ICD-10-CM | POA: Diagnosis not present

## 2022-11-03 DIAGNOSIS — I87392 Chronic venous hypertension (idiopathic) with other complications of left lower extremity: Secondary | ICD-10-CM | POA: Diagnosis not present

## 2022-11-03 DIAGNOSIS — I129 Hypertensive chronic kidney disease with stage 1 through stage 4 chronic kidney disease, or unspecified chronic kidney disease: Secondary | ICD-10-CM | POA: Diagnosis not present

## 2022-11-03 DIAGNOSIS — Z85828 Personal history of other malignant neoplasm of skin: Secondary | ICD-10-CM | POA: Insufficient documentation

## 2022-11-04 NOTE — Progress Notes (Signed)
KENYANA, HUSAK D ([id number]) [id number] Page 1 of 4 Visit Report for 11/03/2022 Abuse Risk Screen Details Patient Name: Date of Service: Sammuel Hines 11/03/2022 12:45 PM Medical Record Number: [id number] Patient Account Number: [id number] Date of Birth/Sex: Treating RN: 1932-07-27 (89 y.o. [name] Primary Care Jean Skow: Ron Agee Other Clinician: Referring Myka Hitz: Treating Hayden Kihara/Extender: Gar Gibbon in Treatment: 0 Abuse Risk Screen Items Answer ABUSE RISK SCREEN: Has anyone close to you tried to hurt or harm you recentlyo No Do you feel uncomfortable with anyone in your familyo No Has anyone forced you do things that you didnt want to doo No Electronic Signature(s) Signed: 11/03/2022 4:13:56 PM By: Karie Schwalbe RN Entered By: Karie Schwalbe on 11/03/2022 13:07:13 -------------------------------------------------------------------------------- Activities of Daily Living Details Patient Name: Date of Service: Sammuel Hines 11/03/2022 12:45 PM Medical Record Number: [id number] Patient Account Number: [id number] Date of Birth/Sex: Treating RN: Aug 02, 1932 (89 y.o. [name] Primary Care Kelcy Baeten: Ron Agee Other Clinician: Referring Alvina Strother: Treating Page Lancon/Extender: Gar Gibbon in Treatment: 0 Activities of Daily Living Items Answer Activities of Daily Living (Please select one for each item) Drive Automobile Completely Able T Medications ake Completely Able Use T elephone Completely Able Care for Appearance Completely Able Use T oilet Completely Able Bath / Shower Completely Able Dress Self Completely Able Feed Self Completely Able Walk Completely Able Get In / Out Bed Completely Able Housework Completely Able Prepare Meals Completely Able Handle Money Completely Able Shop for Self Completely  Able Electronic Signature(s) Signed: 11/03/2022 4:13:56 PM By: Karie Schwalbe RN Entered By: Karie Schwalbe on 11/03/2022 13:08:02 -------------------------------------------------------------------------------- Education Screening Details Patient Name: Date of Service: [name] NCES D. 11/03/2022 12:45 PM Medical Record Number: [id number] Patient Account Number: [id number] Date of Birth/Sex: Treating RN: [birth year] (89 y.o. [name] Primary Care Andy Moye: Ron Agee Other Clinician: Referring Burnadette Baskett: Treating Mishael Haran/Extender: Gar Gibbon in Treatment: [address] EDELL, MESENBRINK D ([id number]) [id number] Page 2 of 4 Primary Learner Assessed: Patient Learning Preferences/Education Level/Primary Language Learning Preference: Explanation, Demonstration, Printed Material Highest Education Level: College or Above Preferred Language: Economist Language Barrier: No Translator Needed: No Memory Deficit: No Emotional Barrier: No Cultural/Religious Beliefs Affecting Medical Care: No Physical Barrier Impaired Vision: No Impaired Hearing: No Decreased Hand dexterity: No Knowledge/Comprehension Knowledge Level: High Comprehension Level: High Ability to understand written instructions: High Ability to understand verbal instructions: High Motivation Anxiety Level: Calm Cooperation: Cooperative Education Importance: Acknowledges Need Interest in Health Problems: Asks Questions Perception: Coherent Willingness to Engage in Self-Management High Activities: Readiness to Engage in Self-Management High Activities: Electronic Signature(s) Signed: 11/03/2022 4:13:56 PM By: Karie Schwalbe RN Entered By: Karie Schwalbe on 11/03/2022 13:09:56 -------------------------------------------------------------------------------- Fall Risk Assessment Details Patient Name: Date of Service: [name] NCES D. 11/03/2022 12:45 PM Medical Record Number: [id number] Patient Account Number: [id number] Date of Birth/Sex: Treating RN: 1932/11/15 (89 y.o. [name] Primary Care Deyona Soza: Ron Agee Other Clinician: Referring Nihal Marzella: Treating Amire Gossen/Extender: Gar Gibbon in Treatment: 0 Fall Risk Assessment Items Have you had 2 or more falls in the last 12 monthso 0 No Have you had any fall that resulted in injury in the last 12 monthso 0 Yes FALLS RISK SCREEN History of falling - immediate or within 3 months 0 No Secondary diagnosis (Do you have 2 or more medical diagnoseso) 0 No Ambulatory aid None/bed rest/wheelchair/nurse 0 No Crutches/cane/walker 0 No Furniture 0  No Intravenous therapy Access/Saline/Heparin Lock 0 No Gait/Transferring Normal/ bed rest/ wheelchair 0 No Weak (short steps with or without shuffle, stooped but able to lift head while walking, may seek 0 No support from furniture) Impaired (short steps with shuffle, may have difficulty arising from chair, head down, impaired 0 No balance) Mental Status Oriented to own ability 0 No Overestimates or forgets limitations 0 No Risk Level: Low Risk Score: 0 TRENDA, CORLISS D ([id number]) [phone] [id number] Page 3 of 4 Electronic Signature(s) -------------------------------------------------------------------------------- Foot Assessment Details Patient Name: Date of Service: Sammuel Hines 11/03/2022 12:45 PM Medical Record Number: [id number] Patient Account Number: [id number] Date of Birth/Sex: Treating RN: April 02, 1933 (89 y.o. [name] Primary Care Ohn Bostic: Ron Agee Other Clinician: Referring Markus Casten: Treating Shakea Isip/Extender: Gar Gibbon in Treatment: 0 Foot Assessment Items Site Locations + = Sensation present, - = Sensation absent, C = Callus, U = Ulcer R =  Redness, W = Warmth, M = Maceration, PU = Pre-ulcerative lesion F = Fissure, S = Swelling, D = Dryness Assessment Right: Left: Other Deformity: No No Prior Foot Ulcer: No No Prior Amputation: No No Charcot Joint: No No Ambulatory Status: Ambulatory Without Help Gait: Steady Electronic Signature(s) Signed: 11/03/2022 4:13:56 PM By: Karie Schwalbe RN Entered By: Karie Schwalbe on 11/03/2022 13:24:59 -------------------------------------------------------------------------------- Nutrition Risk Screening Details Patient Name: Date of Service: Juliette Alcide D. 11/03/2022 12:45 PM Medical Record Number: [id number] Patient Account Number: [id number] Date of Birth/Sex: Treating RN: May 12, 1933 (89 y.o. [name] Primary Care Calli Bashor: Ron Agee Other Clinician: Referring Gareld Obrecht: Treating Griselda Tosh/Extender: Gar Gibbon in Treatment: 0 Height (in): Weight (lbs): Body Mass Index (BMI): MICKEY, HEBEL ([id number]) [phone] [id number].pdf Page 4 of 4 Nutrition Risk Screening Items Score Screening NUTRITION RISK SCREEN: I have an illness or condition that made me change the kind and/or amount of food I eat 0 No I eat fewer than two meals per day 0 No I eat few fruits and vegetables, or milk products 0 No I have three or more drinks of beer, liquor or wine almost every day 0 No I have tooth or mouth problems that make it hard for me to eat 0 No I don't always have enough money to buy the food I need 0 No I eat alone most of the time 0 No I take three or more different prescribed or over-the-counter drugs a day 0 No Without wanting to, I have lost or gained 10 pounds in the last six months 0 No I am not always physically able to shop, cook and/or feed myself 0 No Nutrition Protocols Good Risk Protocol 0 No interventions needed Moderate Risk Protocol High Risk Proctocol Risk Level: Good Risk Score:  0 Electronic Signature(s) Signed: 11/03/2022 4:13:56 PM By: Karie Schwalbe RN Entered By: Karie Schwalbe on 11/03/2022 13:12:26

## 2022-11-04 NOTE — Progress Notes (Signed)
[name], [name] ([id number]) [id number] Page 1 of 7 Visit Report for 11/03/2022 Allergy List Details Patient Name: Date of Service: Kimberly Frye 11/03/2022 12:45 PM Medical Record Number: [id number] Patient Account Number: [id number] Date of Birth/Sex: Treating RN: March 18, 1933 (89 y.o. Kimberly Frye Primary Care Oriah Leinweber: Ron Agee Other Clinician: Referring Tanetta Fuhriman: Treating Kingslee Dowse/Extender: Gar Gibbon in Treatment: 0 Allergies Active Allergies citalopram nebivolol HCl penicillin Allergy Notes Electronic Signature(s) Signed: 11/03/2022 4:13:56 PM By: Karie Schwalbe RN Entered By: Karie Schwalbe on 11/03/2022 12:54:49 -------------------------------------------------------------------------------- Arrival Information Details Patient Name: Date of Service: Kimberly Frye NCES D. 11/03/2022 12:45 PM Medical Record Number: [id number] Patient Account Number: [id number] Date of Birth/Sex: Treating RN: 01-May-1933 (89 y.o. Kimberly Frye Primary Care Obinna Ehresman: Ron Agee Other Clinician: Referring Ailany Koren: Treating Herman Fiero/Extender: Gar Gibbon in Treatment: 0 Visit Information Patient Arrived: Ambulatory Arrival Time: 12:59 Accompanied By: daughter Transfer Assistance: Manual Patient Identification Verified: Yes Electronic Signature(s) Signed: 11/03/2022 4:13:56 PM By: Karie Schwalbe RN Entered By: Karie Schwalbe on 11/03/2022 13:06:26 -------------------------------------------------------------------------------- Clinic Level of Care Assessment Details Patient Name: Date of Service: Kimberly Frye 11/03/2022 12:45 PM Medical Record Number: [id number] Patient Account Number: [id number] Date of Birth/Sex: Treating RN: 1932/10/12 (89 y.o. Kimberly Frye Primary Care Alexis Mizuno: Ron Agee Other Clinician: Referring  Marten Iles: Treating Jeralynn Vaquera/Extender: Gar Gibbon in Treatment: 0 Clinic Level of Care Assessment Items TOOL 2 Quantity Score X- 1 0 Use when only an EandM is performed on the INITIAL visit ASSESSMENTS - Nursing Assessment / Reassessment X- 1 20 General Physical Exam (combine w/ comprehensive assessment (listed just below) when performed on new pt. [address], BIER ([id number]) [id number] Page 2 of 7 X- 1 25 Comprehensive Assessment (HX, ROS, Risk Assessments, Wounds Hx, etc.) ASSESSMENTS - Wound and Skin A ssessment / Reassessment X - Simple Wound Assessment / Reassessment - one wound 1 5 []  - 0 Complex Wound Assessment / Reassessment - multiple wounds []  - 0 Dermatologic / Skin Assessment (not related to wound area) ASSESSMENTS - Ostomy and/or Continence Assessment and Care []  - 0 Incontinence Assessment and Management []  - 0 Ostomy Care Assessment and Management (repouching, etc.) PROCESS - Coordination of Care X - Simple Patient / Family Education for ongoing care 1 15 []  - 0 Complex (extensive) Patient / Family Education for ongoing care X- 1 10 Staff obtains Chiropractor, Records, T Results / Process Orders est X- 1 10 Staff telephones HHA, Nursing Homes / Clarify orders / etc []  - 0 Routine Transfer to another Facility (non-emergent condition) []  - 0 Routine Hospital Admission (non-emergent condition) X- 1 15 New Admissions / Manufacturing engineer / Ordering NPWT Apligraf, etc. , []  - 0 Emergency Hospital Admission (emergent condition) X- 1 10 Simple Discharge Coordination []  - 0 Complex (extensive) Discharge Coordination PROCESS - Special Needs []  - 0 Pediatric / Minor Patient Management []  - 0 Isolation Patient Management []  - 0 Hearing / Language / Visual special needs []  - 0 Assessment of Community assistance (transportation, D/C planning, etc.) []  - 0 Additional assistance / Altered  mentation []  - 0 Support Surface(s) Assessment (bed, cushion, seat, etc.) INTERVENTIONS - Wound Cleansing / Measurement X- 1 5 Wound Imaging (photographs - any number of wounds) []  - 0 Wound Tracing (instead of photographs) X- 1 5 Simple Wound Measurement - one wound []  - 0 Complex Wound Measurement - multiple wounds X- 1 5 Simple Wound Cleansing - one wound []  -  0 Complex Wound Cleansing - multiple wounds INTERVENTIONS - Wound Dressings  - 0 Small Wound Dressing one or multiple wounds  - 0 Medium Wound Dressing one or multiple wounds  - 0 Large Wound Dressing one or multiple wounds  - 0 Application of Medications - injection INTERVENTIONS - Miscellaneous  - 0 External ear exam  - 0 Specimen Collection (cultures, biopsies, blood, body fluids, etc.)  - 0 Specimen(s) / Culture(s) sent or taken to Lab for analysis  - 0 Patient Transfer (multiple staff / Michiel Sites Lift / Similar devices)  - 0 Simple Staple / Suture removal (25 or less)  - 0 Complex Staple / Suture removal (26 or more) SONDRA, BLIXT D ([id number]) [phone].pdf Page 3 of 7  - 0 Hypo / Hyperglycemic Management (close monitor of Blood Glucose) X- 1 15 Ankle / Brachial Index (ABI) - do not check if billed separately Has the patient been seen at the hospital within the last three years: Yes Total Score: 140 Level Of Care: New/Established - Level 4 Electronic Signature(s) Signed: 11/03/2022 4:13:56 PM By: Karie Schwalbe RN Entered By: Karie Schwalbe on 11/03/2022 15:40:09 -------------------------------------------------------------------------------- Encounter Discharge Information Details Patient Name: Date of Service: Kimberly Frye NCES D. 11/03/2022 12:45 PM Medical Record Number: [id number] Patient Account Number: [id number] Date of Birth/Sex: Treating RN: [birth year] (89 y.o. Kimberly Frye Primary Care Dashiel Bergquist: Ron Agee Other  Clinician: Referring Lanyia Jewel: Treating Lanee Chain/Extender: Gar Gibbon in Treatment: 0 Encounter Discharge Information Items Discharge Condition: Stable Ambulatory Status: Ambulatory Discharge Destination: Home Transportation: Private Auto Accompanied By: daughter-in-law and son Schedule Follow-up Appointment: Yes Clinical Summary of Care: Patient Declined Electronic Signature(s) Signed: 11/03/2022 4:13:56 PM By: Karie Schwalbe RN Entered By: Karie Schwalbe on 11/03/2022 15:41:15 -------------------------------------------------------------------------------- Lower Extremity Assessment Details Patient Name: Date of Service: Juliette Alcide D. 11/03/2022 12:45 PM Medical Record Number: [id number] Patient Account Number: [id number] Date of Birth/Sex: Treating RN: 07/15/1932 (89 y.o. Kimberly Frye Primary Care Jeremi Losito: Ron Agee Other Clinician: Referring Oline Belk: Treating Ammi Hutt/Extender: Gar Gibbon in Treatment: 0 Edema Assessment Assessed: [Left: No] [Right: No] [Left: Edema] [Right: :] Calf Left: Right: Point of Measurement: 27 cm From Medial Instep 30.2 cm Ankle Left: Right: Point of Measurement: 9 cm From Medial Instep 20.2 cm Knee To Floor Left: Right: From Medial Instep 41 cm Vascular Assessment Pulses: Dorsalis Pedis Palpable: [Left:Yes] Blood Pressure: MIREYAH, CHERVENAK D ([id number]) [Right:[id number] Page 4 of 7] Brachial: [Left:146] Ankle: [Left:Dorsalis Pedis: 124 0.85] Electronic Signature(s) Signed: 11/03/2022 4:13:56 PM By: Karie Schwalbe RN Entered By: Karie Schwalbe on 11/03/2022 14:02:36 -------------------------------------------------------------------------------- Multi Wound Chart Details Patient Name: Date of Service: Kimberly Frye NCES D. 11/03/2022 12:45 PM Medical Record Number: [id number] Patient Account Number:  [id number] Date of Birth/Sex: Treating RN: 02/16/1933 (89 y.o. F) Primary Care Dayanara Sherrill: Ron Agee Other Clinician: Referring Davine Sweney: Treating Kein Carlberg/Extender: Gar Gibbon in Treatment: 0 Vital Signs Height(in): Pulse(bpm): 92 Weight(lbs): Blood Pressure(mmHg): 146/80 Body Mass Index(BMI): Temperature(F): 98.5 Respiratory Rate(breaths/min): 18 [1:Photos:] [N/A:N/A] Left, Anterior Lower Leg N/A N/A Wound Location: Other Lesion N/A N/A Wounding Event: Trauma, Other N/A N/A Primary Etiology: Hypertension N/A N/A Comorbid History: 07/16/2022 N/A N/A Date Acquired: 0 N/A N/A Weeks of Treatment: Healed - Epithelialized N/A N/A Wound Status: No N/A N/A Wound Recurrence: 0x0x0 N/A N/A Measurements L x W x D (cm) 0 N/A N/A A (cm) : rea 0 N/A N/A Volume (cm) : Full Thickness Without Exposed N/A N/A Classification: Support Structures None Present  N/A N/A Exudate Amount: Distinct, outline attached N/A N/A Wound Margin: None Present (0%) N/A N/A Granulation Amount: None Present (0%) N/A N/A Necrotic Amount: Fascia: No N/A N/A Exposed Structures: Fat Layer (Subcutaneous Tissue): No Tendon: No Muscle: No Joint: No Bone: No Large (67-100%) N/A N/A Epithelialization: Scarring: Yes N/A N/A Periwound Skin Texture: No Abnormalities Noted N/A N/A Periwound Skin Moisture: Hemosiderin Staining: Yes N/A N/A Periwound Skin Color: No Abnormality N/A N/A Temperature: Treatment Notes Electronic Signature(s) Signed: 11/03/2022 3:44:57 PM By: Geralyn Corwin DO Entered By: Geralyn Corwin on 11/03/2022 14:17:17 Newell Coral D ([id number]) [id number] Page 5 of 7 -------------------------------------------------------------------------------- Multi-Disciplinary Care Plan Details Patient Name: Date of Service: Kimberly Frye 11/03/2022 12:45 PM Medical Record Number: [id number] Patient  Account Number: [id number] Date of Birth/Sex: Treating RN: 07-31-1932 (89 y.o. Kimberly Frye Primary Care Daily Doe: Ron Agee Other Clinician: Referring Valree Feild: Treating Donnisha Besecker/Extender: Gar Gibbon in Treatment: 0 Active Inactive Electronic Signature(s) Signed: 11/03/2022 4:13:56 PM By: Karie Schwalbe RN Entered By: Karie Schwalbe on 11/03/2022 13:54:36 -------------------------------------------------------------------------------- Pain Assessment Details Patient Name: Date of Service: Juliette Alcide D. 11/03/2022 12:45 PM Medical Record Number: [id number] Patient Account Number: [id number] Date of Birth/Sex: Treating RN: [birth year] (89 y.o. Kimberly Frye Primary Care Nikkol Pai: Ron Agee Other Clinician: Referring Carel Carrier: Treating Desha Bitner/Extender: Gar Gibbon in Treatment: 0 Active Problems Location of Pain Severity and Description of Pain Patient Has Paino No Site Locations Pain Management and Medication Current Pain Management: Electronic Signature(s) Signed: 11/03/2022 4:13:56 PM By: Karie Schwalbe RN Entered By: Karie Schwalbe on 11/03/2022 13:46:20 -------------------------------------------------------------------------------- Patient/Caregiver Education Details Patient Name: Date of Service: Juliette Alcide D. 4/23/2024andnbsp12:45 PM Medical Record Number: [id number] Patient Account Number: [id number] Date of Birth/Gender: Treating RN: 08-Nov-1932 (89 y.o. Kimberly Frye Primary Care Physician: Ron Agee Other Clinician: Referring Physician: Treating Physician/Extender: Gar Gibbon in TreatmentFRAYA, UEDA D ([id number]) [id number] Page 6 of 7 Education Assessment Education Provided To: Patient Education Topics Provided Wound Debridement: Methods:  Explain/Verbal Responses: State content correctly Electronic Signature(s) Signed: 11/03/2022 4:13:56 PM By: Karie Schwalbe RN Entered By: Karie Schwalbe on 11/03/2022 13:55:28 -------------------------------------------------------------------------------- Wound Assessment Details Patient Name: Date of Service: Kimberly Frye NCES D. 11/03/2022 12:45 PM Medical Record Number: [id number] Patient Account Number: [id number] Date of Birth/Sex: Treating RN: Nov 26, 1932 (89 y.o. Kimberly Frye Primary Care Anselm Aumiller: Ron Agee Other Clinician: Referring Latrenda Irani: Treating Jeslynn Hollander/Extender: Gar Gibbon in Treatment: 0 Wound Status Wound Number: 1 Primary Etiology: Trauma, Other Wound Location: Left, Anterior Lower Leg Wound Status: Healed - Epithelialized Wounding Event: Other Lesion Comorbid History: Hypertension Date Acquired: 07/16/2022 Weeks Of Treatment: 0 Clustered Wound: No Photos Wound Measurements Length: (cm) Width: (cm) Depth: (cm) Area: (cm) Volume: (cm) 0 % Reduction in Area: 0 % Reduction in Volume: 0 Epithelialization: Large (67-100%) 0 Tunneling: No 0 Undermining: No Wound Description Classification: Full Thickness Without Exposed Support Wound Margin: Distinct, outline attached Exudate Amount: None Present Structures Foul Odor After Cleansing: No Slough/Fibrino No Wound Bed Granulation Amount: None Present (0%) Exposed Structure Necrotic Amount: None Present (0%) Fascia Exposed: No Fat Layer (Subcutaneous Tissue) Exposed: No Tendon Exposed: No Muscle Exposed: No Joint Exposed: No Bone Exposed: No [id number] DANALI, MARINOS D ([id number]) [id number] Page 7 of 7 Texture Color No Abnormalities Noted: Yes No Abnormalities Noted: Yes Moisture Temperature / Pain No Abnormalities Noted: Yes Temperature: No Abnormality Electronic Signature(s) Signed: 11/03/2022 4:13:56  PM By:  Karie Schwalbe RN Entered By: Karie Schwalbe on 11/03/2022 13:52:43 -------------------------------------------------------------------------------- Vitals Details Patient Name: Date of Service: Juliette Alcide D. 11/03/2022 12:45 PM Medical Record Number: [id number] Patient Account Number: [id number] Date of Birth/Sex: Treating RN: 07/14/1932 (89 y.o. Kimberly Frye Primary Care Phoenyx Paulsen: Ron Agee Other Clinician: Referring Anhar Mcdermott: Treating Deldrick Linch/Extender: Gar Gibbon in Treatment: 0 Vital Signs Time Taken: 13:06 Temperature (F): 98.5 Pulse (bpm): 92 Respiratory Rate (breaths/min): 18 Blood Pressure (mmHg): 146/80 Reference Range: 80 - 120 mg / dl Electronic Signature(s) Signed: 11/03/2022 4:13:56 PM By: Karie Schwalbe RN Entered By: Karie Schwalbe on 11/03/2022 13:06:54

## 2022-11-05 DIAGNOSIS — H5203 Hypermetropia, bilateral: Secondary | ICD-10-CM | POA: Diagnosis not present

## 2022-11-05 DIAGNOSIS — H52223 Regular astigmatism, bilateral: Secondary | ICD-10-CM | POA: Diagnosis not present

## 2022-11-05 DIAGNOSIS — H524 Presbyopia: Secondary | ICD-10-CM | POA: Diagnosis not present

## 2022-11-05 DIAGNOSIS — Z961 Presence of intraocular lens: Secondary | ICD-10-CM | POA: Diagnosis not present

## 2022-11-05 DIAGNOSIS — H04123 Dry eye syndrome of bilateral lacrimal glands: Secondary | ICD-10-CM | POA: Diagnosis not present

## 2022-11-05 DIAGNOSIS — Z135 Encounter for screening for eye and ear disorders: Secondary | ICD-10-CM | POA: Diagnosis not present

## 2022-11-06 NOTE — Progress Notes (Signed)
RETHER, RISON Frye ([id number]) [id number] Page 1 of 5 Visit Report for 11/03/2022 Chief Complaint Document Details Patient Name: Date of Service: Kimberly Frye 11/03/2022 12:45 PM Medical Record Number: [id number] Patient Account Number: [id number] Date of Birth/Sex: Treating RN: March 04, 1933 (89 y.o. F) Primary Care Provider: Ron Agee Other Clinician: Referring Provider: Treating Provider/Extender: Gar Gibbon in Treatment: 0 Information Obtained from: Patient Chief Complaint 11/03/2022; history of cellulitis to the left lower extremity Electronic Signature(s) Signed: 11/03/2022 3:44:57 PM By: Geralyn Corwin DO Entered By: Geralyn Corwin on 11/03/2022 14:17:40 -------------------------------------------------------------------------------- HPI Details Patient Name: Date of Service: Kimberly Jews NCES Frye. 11/03/2022 12:45 PM Medical Record Number: [id number] Patient Account Number: [id number] Date of Birth/Sex: Treating RN: 12/02/1932 (89 y.o. F) Primary Care Provider: Ron Agee Other Clinician: Referring Provider: Treating Provider/Extender: Gar Gibbon in Treatment: 0 History of Present Illness HPI Description: 11/03/22 Ms. Kimberly Frye is an 89 year old female with a past medical history of venous insufficiency, essential hypertension, hypothyroidism and skin cancer that presents to the clinic for a history of wound to her left lower extremity. Patient states that in January she hit her leg against an ottoman and developed a wound. Over the past couple of months the wound has healed. Unfortunately She developed cellulitis and saw her primary care office for this issue on 4/1. She was treated with doxycycline and Per daughter who is present and helps provide the history the redness has improved significantly. She has no wounds or any drainage today. She has  mild erythema to the left leg that appears to be resolving. Patient has compression stockings but not does not use them. Electronic Signature(s) Signed: 11/03/2022 3:44:57 PM By: Geralyn Corwin DO Entered By: Geralyn Corwin on 11/03/2022 14:27:58 -------------------------------------------------------------------------------- Physical Exam Details Patient Name: Date of Service: Kimberly Alcide Frye. 11/03/2022 12:45 PM Medical Record Number: [id number] Patient Account Number: [id number] Date of Birth/Sex: Treating RN: 05-16-1933 (89 y.o. F) Primary Care Provider: Ron Agee Other Clinician: Referring Provider: Treating Provider/Extender: Gar Gibbon in Treatment: 0 Constitutional respirations regular, non-labored and within target range for patient.. Cardiovascular 2+ dorsalis pedis/posterior tibialis pulses. Psychiatric pleasant and cooperative. Notes Kimberly Frye, Kimberly Frye ([id number]) [id number] Page 2 of 5 No open wounds to the lower extremities bilaterally. Venous stasis dermatitis. No signs of infection including increased warmth, erythema or purulent drainage Electronic Signature(s) Signed: 11/03/2022 3:44:57 PM By: Geralyn Corwin DO Entered By: Geralyn Corwin on 11/03/2022 14:28:18 -------------------------------------------------------------------------------- Physician Orders Details Patient Name: Date of Service: Kimberly Jews NCES Frye. 11/03/2022 12:45 PM Medical Record Number: [id number] Patient Account Number: [id number] Date of Birth/Sex: Treating RN: 05-07-1933 (89 y.o. [name] Primary Care Provider: Ron Agee Other Clinician: Referring Provider: Treating Provider/Extender: Gar Gibbon in Treatment: 0 Verbal / Phone Orders: No Diagnosis Coding Edema Control - Lymphedema / SCD / Other Bilateral Lower Extremities Elevate legs to the level  of the heart or above for 30 minutes daily and/or when sitting for 3-4 times a day throughout the day. - Elevate the legs throughout the day when not standing or walking. Avoid standing for long periods of time. Exercise regularly - Ease into restarting your exercise. Moisturize legs daily. - Use a good lotion such as: Aquaphor, Eucerin, Sween etc. Compression stocking or Garment 20-30 mm/Hg pressure to: - Please wear compression stockings. Put stockings on in the morning and take off at night. Electronic Signature(s) Signed: 11/03/2022 3:44:57 PM By: Geralyn Corwin DO  Entered By: Geralyn Corwin on 11/03/2022 14:28:24 -------------------------------------------------------------------------------- Problem List Details Patient Name: Date of Service: Kimberly Frye 11/03/2022 12:45 PM Medical Record Number: [id number] Patient Account Number: [id number] Date of Birth/Sex: Treating RN: 1933/06/06 (89 y.o. F) Primary Care Provider: Ron Agee Other Clinician: Referring Provider: Treating Provider/Extender: Gar Gibbon in Treatment: 0 Active Problems ICD-10 Encounter Code Description Active Date MDM Diagnosis L03.116 Cellulitis of left lower limb 11/03/2022 No Yes I87.392 Chronic venous hypertension (idiopathic) with other complications of left lower 11/03/2022 No Yes extremity Inactive Problems Resolved Problems Electronic Signature(s) Signed: 11/03/2022 3:44:57 PM By: Geralyn Corwin DO Entered By: Geralyn Corwin on 11/03/2022 14:17:12 Kimberly Frye ([id number]) [id number] Page 3 of 5 -------------------------------------------------------------------------------- Progress Note Details Patient Name: Date of Service: Kimberly Frye 11/03/2022 12:45 PM Medical Record Number: [id number] Patient Account Number: [id number] Date of Birth/Sex: Treating RN: Feb 15, 1933 (89 y.o. F) Primary Care  Provider: Ron Agee Other Clinician: Referring Provider: Treating Provider/Extender: Gar Gibbon in Treatment: 0 Subjective Chief Complaint Information obtained from Patient 11/03/2022; history of cellulitis to the left lower extremity History of Present Illness (HPI) 11/03/22 Ms. Kimberly Frye is an 89 year old female with a past medical history of venous insufficiency, essential hypertension, hypothyroidism and skin cancer that presents to the clinic for a history of wound to her left lower extremity. Patient states that in January she hit her leg against an ottoman and developed a wound. Over the past couple of months the wound has healed. Unfortunately She developed cellulitis and saw her primary care office for this issue on 4/1. She was treated with doxycycline and Per daughter who is present and helps provide the history the redness has improved significantly. She has no wounds or any drainage today. She has mild erythema to the left leg that appears to be resolving. Patient has compression stockings but not does not use them. Patient History Allergies citalopram, nebivolol HCl, penicillin Family History Unknown History. Medical History Cardiovascular Patient has history of Hypertension Medical A Surgical History Notes nd Cardiovascular abdominal aortic atherosclerosis hyperlipidemia Genitourinary ckd Psychiatric anxiety, depression Review of Systems (ROS) Ear/Nose/Mouth/Throat Complains or has symptoms of Chronic sinus problems or rhinitis. Respiratory Denies complaints or symptoms of Chronic or frequent coughs, Shortness of Breath. Endocrine hypothyroidism, Objective Constitutional respirations regular, non-labored and within target range for patient.. Vitals Time Taken: 1:06 PM, Temperature: 98.5 F, Pulse: 92 bpm, Respiratory Rate: 18 breaths/min, Blood Pressure: 146/80 mmHg. Cardiovascular 2+ dorsalis  pedis/posterior tibialis pulses. Psychiatric pleasant and cooperative. General Notes: No open wounds to the lower extremities bilaterally. Venous stasis dermatitis. No signs of infection including increased warmth, erythema or purulent drainage Integumentary (Hair, Skin) Wound #1 status is Healed - Epithelialized. Original cause of wound was Other Lesion. The date acquired was: 07/16/2022. The wound is located on the Left,Anterior Lower Leg. The wound measures 0cm length x 0cm width x 0cm depth; 0cm^2 area and 0cm^3 volume. There is no tunneling or undermining noted. There is a none present amount of drainage noted. The wound margin is distinct with the outline attached to the wound base. There is no granulation within the wound bed. There is no necrotic tissue within the wound bed. The periwound skin appearance had no abnormalities noted for texture. The periwound skin appearance had no abnormalities noted for moisture. The periwound skin appearance had no abnormalities noted for color. Periwound temperature was noted as No Abnormality. Kimberly Frye, Kimberly Frye ([id number]) [id number] Page 4 of 5 Assessment Active  Problems ICD-10 Cellulitis of left lower limb Chronic venous hypertension (idiopathic) with other complications of left lower extremity Patient developed a wound in January from hitting an ottoman. Over the past couple months this has healed. She developed cellulitis earlier this month and was treated with doxycycline with resolution of her symptoms. I recommended she follow-up with her PCP if she were to develop cellulitis again. If she were to develop a wound again I recommended she call our clinic. She also has evidence of venous insufficiency on exam. She has compression stockings at home But does not use them. ABIs in office were 0.85. I recommended compression stockings daily 20-30 mmHg. Plan Edema Control - Lymphedema / SCD / Other: Elevate legs to the  level of the heart or above for 30 minutes daily and/or when sitting for 3-4 times a day throughout the day. - Elevate the legs throughout the day when not standing or walking. Avoid standing for long periods of time. Exercise regularly - Ease into restarting your exercise. Moisturize legs daily. - Use a good lotion such as: Aquaphor, Eucerin, Sween etc. Compression stocking or Garment 20-30 mm/Hg pressure to: - Please wear compression stockings. Put stockings on in the morning and take off at night. 1. Follow-up as needed 2. Compression stockings daily Electronic Signature(s) Signed: 11/03/2022 3:44:57 PM By: Geralyn Corwin DO Entered By: Geralyn Corwin on 11/03/2022 14:30:19 -------------------------------------------------------------------------------- HxROS Details Patient Name: Date of Service: Kimberly Jews NCES Frye. 11/03/2022 12:45 PM Medical Record Number: [id number] Patient Account Number: [id number] Date of Birth/Sex: Treating RN: [birth year] (89 y.o. Kimberly Frye Primary Care Provider: Ron Agee Other Clinician: Referring Provider: Treating Provider/Extender: Gar Gibbon in Treatment: 0 Ear/Nose/Mouth/Throat Complaints and Symptoms: Positive for: Chronic sinus problems or rhinitis Respiratory Complaints and Symptoms: Negative for: Chronic or frequent coughs; Shortness of Breath Cardiovascular Medical History: Positive for: Hypertension Past Medical History Notes: abdominal aortic atherosclerosis hyperlipidemia Endocrine Complaints and Symptoms: Review of System Notes: hypothyroidism, Genitourinary Kimberly Frye, Kimberly Frye ([id number]) [phone].pdf Page 5 of 5 Medical History: Past Medical History Notes: ckd Psychiatric Medical History: Past Medical History Notes: anxiety, depression Immunizations Pneumococcal Vaccine: Received Pneumococcal Vaccination: Yes Received Pneumococcal  Vaccination On or After 60th Birthday: Yes Implantable Devices No devices added Family and Social History Unknown History: Yes Psychologist, prison and probation services) Signed: 11/03/2022 9:33:30 AM By: Geralyn Corwin DO Signed: 11/05/2022 3:29:20 PM By: Redmond Pulling RN, BSN Entered By: Redmond Pulling on 11/02/2022 16:18:16 -------------------------------------------------------------------------------- SuperBill Details Patient Name: Date of Service: Kimberly Jews NCES Frye. 11/03/2022 Medical Record Number: [id number] Patient Account Number: [id number] Date of Birth/Sex: Treating RN: 1932-08-09 (89 y.o. F) Primary Care Provider: Ron Agee Other Clinician: Referring Provider: Treating Provider/Extender: Gar Gibbon in Treatment: 0 Diagnosis Coding ICD-10 Codes Code Description L03.116 Cellulitis of left lower limb I87.392 Chronic venous hypertension (idiopathic) with other complications of left lower extremity Facility Procedures : CPT4 Code: [id number] Description: 99214 - WOUND CARE VISIT-LEV 4 EST PT Modifier: Quantity: 1 Physician Procedures : CPT4 Code Description Modifier [id number] - WC PHYS LEVEL 4 - NEW PT ICD-10 Diagnosis Description I87.392 Chronic venous hypertension (idiopathic) with other complications of left lower extremity L03.116 Cellulitis of left lower limb Quantity: 1 Electronic Signature(s) Signed: 11/03/2022 3:44:57 PM By: Geralyn Corwin DO Signed: 11/03/2022 4:13:56 PM By: Karie Schwalbe RN Entered By: Karie Schwalbe on 11/03/2022 15:40:19

## 2022-11-09 DIAGNOSIS — E785 Hyperlipidemia, unspecified: Secondary | ICD-10-CM | POA: Diagnosis not present

## 2022-11-09 DIAGNOSIS — E559 Vitamin D deficiency, unspecified: Secondary | ICD-10-CM | POA: Diagnosis not present

## 2022-11-09 DIAGNOSIS — L03116 Cellulitis of left lower limb: Secondary | ICD-10-CM | POA: Diagnosis not present

## 2022-11-09 DIAGNOSIS — I10 Essential (primary) hypertension: Secondary | ICD-10-CM | POA: Diagnosis not present

## 2022-11-09 DIAGNOSIS — F411 Generalized anxiety disorder: Secondary | ICD-10-CM | POA: Diagnosis not present

## 2022-11-09 DIAGNOSIS — E039 Hypothyroidism, unspecified: Secondary | ICD-10-CM | POA: Diagnosis not present

## 2022-11-14 DIAGNOSIS — Z87891 Personal history of nicotine dependence: Secondary | ICD-10-CM | POA: Diagnosis not present

## 2022-11-14 DIAGNOSIS — E559 Vitamin D deficiency, unspecified: Secondary | ICD-10-CM | POA: Diagnosis not present

## 2022-11-14 DIAGNOSIS — E039 Hypothyroidism, unspecified: Secondary | ICD-10-CM | POA: Diagnosis not present

## 2022-11-14 DIAGNOSIS — Z90710 Acquired absence of both cervix and uterus: Secondary | ICD-10-CM | POA: Diagnosis not present

## 2022-11-14 DIAGNOSIS — L03116 Cellulitis of left lower limb: Secondary | ICD-10-CM | POA: Diagnosis not present

## 2022-11-14 DIAGNOSIS — K5904 Chronic idiopathic constipation: Secondary | ICD-10-CM | POA: Diagnosis not present

## 2022-11-14 DIAGNOSIS — F411 Generalized anxiety disorder: Secondary | ICD-10-CM | POA: Diagnosis not present

## 2022-11-14 DIAGNOSIS — I10 Essential (primary) hypertension: Secondary | ICD-10-CM | POA: Diagnosis not present

## 2022-11-14 DIAGNOSIS — Z792 Long term (current) use of antibiotics: Secondary | ICD-10-CM | POA: Diagnosis not present

## 2022-11-14 DIAGNOSIS — E785 Hyperlipidemia, unspecified: Secondary | ICD-10-CM | POA: Diagnosis not present

## 2022-11-14 DIAGNOSIS — Z9181 History of falling: Secondary | ICD-10-CM | POA: Diagnosis not present

## 2022-11-16 DIAGNOSIS — E559 Vitamin D deficiency, unspecified: Secondary | ICD-10-CM | POA: Diagnosis not present

## 2022-11-16 DIAGNOSIS — F411 Generalized anxiety disorder: Secondary | ICD-10-CM | POA: Diagnosis not present

## 2022-11-16 DIAGNOSIS — I10 Essential (primary) hypertension: Secondary | ICD-10-CM | POA: Diagnosis not present

## 2022-11-16 DIAGNOSIS — E785 Hyperlipidemia, unspecified: Secondary | ICD-10-CM | POA: Diagnosis not present

## 2022-11-16 DIAGNOSIS — E039 Hypothyroidism, unspecified: Secondary | ICD-10-CM | POA: Diagnosis not present

## 2022-11-16 DIAGNOSIS — L03116 Cellulitis of left lower limb: Secondary | ICD-10-CM | POA: Diagnosis not present

## 2022-11-18 ENCOUNTER — Encounter (HOSPITAL_BASED_OUTPATIENT_CLINIC_OR_DEPARTMENT_OTHER): Payer: Medicare Other | Admitting: General Surgery

## 2022-12-21 ENCOUNTER — Ambulatory Visit
Admission: EM | Admit: 2022-12-21 | Discharge: 2022-12-21 | Disposition: A | Discharge status: Home / Self Care | Payer: Medicare Other | Attending: Nurse Practitioner | Admitting: Nurse Practitioner

## 2022-12-21 ENCOUNTER — Other Ambulatory Visit: Payer: Self-pay

## 2022-12-21 DIAGNOSIS — R2681 Unsteadiness on feet: Secondary | ICD-10-CM | POA: Insufficient documentation

## 2022-12-21 DIAGNOSIS — G9340 Encephalopathy, unspecified: Principal | ICD-10-CM | POA: Insufficient documentation

## 2022-12-21 DIAGNOSIS — E039 Hypothyroidism, unspecified: Secondary | ICD-10-CM | POA: Insufficient documentation

## 2022-12-21 DIAGNOSIS — I951 Orthostatic hypotension: Secondary | ICD-10-CM | POA: Diagnosis not present

## 2022-12-21 DIAGNOSIS — N1832 Chronic kidney disease, stage 3b: Secondary | ICD-10-CM | POA: Diagnosis not present

## 2022-12-21 DIAGNOSIS — I129 Hypertensive chronic kidney disease with stage 1 through stage 4 chronic kidney disease, or unspecified chronic kidney disease: Secondary | ICD-10-CM | POA: Insufficient documentation

## 2022-12-21 DIAGNOSIS — N179 Acute kidney failure, unspecified: Secondary | ICD-10-CM | POA: Insufficient documentation

## 2022-12-21 DIAGNOSIS — R4182 Altered mental status, unspecified: Secondary | ICD-10-CM | POA: Diagnosis not present

## 2022-12-21 DIAGNOSIS — M6281 Muscle weakness (generalized): Secondary | ICD-10-CM | POA: Diagnosis not present

## 2022-12-21 DIAGNOSIS — E860 Dehydration: Secondary | ICD-10-CM | POA: Diagnosis not present

## 2022-12-21 DIAGNOSIS — R410 Disorientation, unspecified: Secondary | ICD-10-CM | POA: Diagnosis not present

## 2022-12-21 DIAGNOSIS — R1310 Dysphagia, unspecified: Secondary | ICD-10-CM | POA: Insufficient documentation

## 2022-12-21 DIAGNOSIS — I10 Essential (primary) hypertension: Secondary | ICD-10-CM | POA: Diagnosis not present

## 2022-12-21 LAB — CBC
HCT: 33.6 % — ABNORMAL LOW (ref 36.0–46.0)
Hemoglobin: 11.4 g/dL — ABNORMAL LOW (ref 12.0–15.0)
MCH: 30.5 pg (ref 26.0–34.0)
MCHC: 33.9 g/dL (ref 30.0–36.0)
MCV: 89.8 fL (ref 80.0–100.0)
Platelets: 233 10*3/uL (ref 150–400)
RBC: 3.74 MIL/uL — ABNORMAL LOW (ref 3.87–5.11)
RDW: 13.8 % (ref 11.5–15.5)
WBC: 6.8 10*3/uL (ref 4.0–10.5)
nRBC: 0 % (ref 0.0–0.2)

## 2022-12-21 LAB — COMPREHENSIVE METABOLIC PANEL
ALT: 9 U/L (ref 0–44)
AST: 19 U/L (ref 15–41)
Albumin: 4.5 g/dL (ref 3.5–5.0)
Alkaline Phosphatase: 83 U/L (ref 38–126)
Anion gap: 9 (ref 5–15)
BUN: 35 mg/dL — ABNORMAL HIGH (ref 8–23)
CO2: 27 mmol/L (ref 22–32)
Calcium: 10 mg/dL (ref 8.9–10.3)
Chloride: 102 mmol/L (ref 98–111)
Creatinine, Ser: 1.57 mg/dL — ABNORMAL HIGH (ref 0.44–1.00)
GFR, Estimated: 32 mL/min — ABNORMAL LOW (ref >60)
Glucose, Bld: 119 mg/dL — ABNORMAL HIGH (ref 70–99)
Potassium: 4.3 mmol/L (ref 3.5–5.1)
Sodium: 138 mmol/L (ref 135–145)
Total Bilirubin: 0.4 mg/dL (ref 0.3–1.2)
Total Protein: 7.3 g/dL (ref 6.5–8.1)

## 2022-12-21 LAB — POCT URINALYSIS DIP (MANUAL ENTRY)
Bilirubin, UA: NEGATIVE
Blood, UA: NEGATIVE
Glucose, UA: NEGATIVE mg/dL
Ketones, POC UA: NEGATIVE mg/dL
Leukocytes, UA: NEGATIVE
Nitrite, UA: NEGATIVE
Protein Ur, POC: NEGATIVE mg/dL
Spec Grav, UA: 1.02 (ref 1.010–1.025)
Urobilinogen, UA: 0.2 E.U./dL
pH, UA: 6 (ref 5.0–8.0)

## 2022-12-21 LAB — CBG MONITORING, ED: Glucose-Capillary: 116 mg/dL — ABNORMAL HIGH (ref 70–99)

## 2022-12-21 NOTE — ED Triage Notes (Signed)
Pt bib by her son and daughter in law, per daughter in law patient has been acting different today. They are concerned of possible UTI and is concerned of having skipped some of her synthroid medication. Has had some problem sleeping after drinking cheer wine.

## 2022-12-21 NOTE — ED Provider Notes (Signed)
[facility]    CSN: [id number] Arrival date & time: 12/21/22  1920      History   Chief Complaint No chief complaint on file.   HPI Kimberly Frye is a 89 y.o. female is with her son and daughter-in-law for potential UTI.  Patient does live in the area by herself in a condo,  not in ALF.  They report they spoke with her today and she had mentioned that she had let someone into her apartment which is unusual for her.  Patient states it was to check some type of alarm.  This concerned them so they did drive from [place] to check on her.  Reports they felt something was off and wanted to check her for UTI.  Denies history of dementia or Alzheimer's but does state since December they have noticed a cognitive decline beginning but it has been a steady decline.  Son and daughter-in-law report that she is currently at baseline as far as her orientation.  Patient is oriented to person and place but not to time.  Patient denies any headache, dizziness, visual changes, chest pain, shortness of breath.  Family denies history of TIA.  She has been eating and drinking normally.  Family states she has been doing her normal activities including driving.  They just want to check her for UTI.  No other concerns at this time  HPI  History reviewed. No pertinent past medical history.  Patient Active Problem List   Diagnosis Date Noted   Abdominal aortic atherosclerosis (HCC) 10/21/2020   Adjustment disorder 10/21/2020   Allergic rhinitis 10/21/2020   Chronic kidney disease due to hypertension 10/21/2020   Essential hypertension 10/21/2020   History of malignant neoplasm of skin 10/21/2020   Hyperlipidemia 10/21/2020   Hypothyroidism 10/21/2020   Menopausal flushing 10/21/2020   Mild anxiety 10/21/2020   Mixed anxiety and depressive disorder 10/21/2020   Pure hypercholesterolemia 10/21/2020   Tobacco user 10/21/2020   Vitamin D deficiency 10/21/2020    History reviewed. No pertinent  surgical history.  OB History   No obstetric history on file.      Home Medications    Prior to Admission medications   Medication Sig Start Date End Date Taking? Authorizing Provider  cetirizine (ZYRTEC ALLERGY) 10 MG tablet 1 tablet 01/27/19   [provider]  Cholecalciferol (VITAMIN D-3) 25 MCG (1000 UT) CAPS Take by mouth daily.    [provider]  diclofenac Sodium (VOLTAREN) 1 % GEL Apply 4 g topically 4 (four) times daily as needed. 10/24/20   Hilts, Casimiro Needle, MD  estradiol (VIVELLE-DOT) 0.0375 MG/24HR APPLY 1 PATCH TO SKIN 2 TIMES A WEEK 04/21/09   [provider]  Multiple Vitamin (MULTIVITAMIN) tablet Take 1 tablet by mouth daily.    [provider]  mupirocin ointment (BACTROBAN) 2 % Apply 1 application topically 2 (two) times daily. 10/08/20   [provider]  Omega-3 Fatty Acids (FISH OIL) 1000 MG CAPS 1 capsule Patient not taking: Reported on 10/21/2020    [provider]  Probiotic Product (ALIGN) 4 MG CAPS 1 capsule Patient not taking: Reported on 10/21/2020    [provider]  SYNTHROID 75 MCG tablet Take 75 mcg by mouth daily. 09/02/20   [provider]  traMADol (ULTRAM) 50 MG tablet Take 0.5-1 tablets (25-50 mg total) by mouth every 6 (six) hours as needed. 10/21/20   Hilts, Casimiro Needle, MD    Family History History reviewed. No pertinent family history.  Social  History Social History   Tobacco Use   Smoking status: Never   Smokeless tobacco: Never     Allergies   Citalopram hydrobromide, Nebivolol hcl, and Penicillins   Review of Systems Review of Systems  Psychiatric/Behavioral:         Not acting herself     Physical Exam Triage Vital Signs ED Triage Vitals  Enc Vitals Group     BP 12/21/22 1931 (!) 153/66     Pulse Rate 12/21/22 1931 73     Resp 12/21/22 1931 18     Temp 12/21/22 1931 (!) 97.4 F (36.3 C)     Temp Source 12/21/22 1931 Oral     SpO2 12/21/22 1931 93 %      Weight --      Height --      Head Circumference --      Peak Flow --      Pain Score 12/21/22 1930 0     Pain Loc --      Pain Edu? --      Excl. in GC? --    No data found.  Updated Vital Signs BP (!) 153/66 (BP Location: Left Arm)   Pulse 73   Temp (!) 97.4 F (36.3 C) (Oral)   Resp 18   SpO2 93%   Visual Acuity Right Eye Distance:   Left Eye Distance:   Bilateral Distance:    Right Eye Near:   Left Eye Near:    Bilateral Near:     Physical Exam Vitals and nursing note reviewed.  Constitutional:      General: She is not in acute distress.    Appearance: Normal appearance. She is not ill-appearing, toxic-appearing or diaphoretic.  HENT:     Head: Normocephalic and atraumatic.  Eyes:     Extraocular Movements: Extraocular movements intact.     Conjunctiva/sclera: Conjunctivae normal.     Pupils: Pupils are equal, round, and reactive to light.  Cardiovascular:     Rate and Rhythm: Normal rate.  Pulmonary:     Effort: Pulmonary effort is normal.  Skin:    General: Skin is warm and dry.  Neurological:     Mental Status: She is alert. Mental status is at baseline.     Cranial Nerves: No facial asymmetry.     Motor: No weakness or pronator drift.     Coordination: Romberg sign negative. Finger-Nose-Finger Test normal.     Gait: Gait is intact.     Comments: And oriented to self and place but cannot recall president or month.  Does note that year is 2024.  Psychiatric:        Mood and Affect: Mood normal.      UC Treatments / Results  Labs (all labs ordered are listed, but only abnormal results are displayed) Labs Reviewed  POCT URINALYSIS DIP (MANUAL ENTRY)    EKG   Radiology No results found.  Procedures Procedures (including critical care time)  Medications Ordered in UC Medications - No data to display  Initial Impression / Assessment and Plan / UC Course  I have reviewed the triage vital signs and the nursing notes.  Pertinent labs &  imaging results that were available during my care of the patient were reviewed by me and considered in my medical decision making (see chart for details).     Discussed with family and patient patient they are concerns.  UA is negative for UTI.  Again son and daughter mom reports she is  at baseline as far as orientation and cognition.  She has a negative stroke assessment.  Discussed limitations and abilities of urgent care.  Advised that if their concern is for acute cognitive changes they should take her to the emergency room for further evaluation.  Son and daughter-in-law as well as patient verbalized understanding of this and state they will take her to the emergency room. Final Clinical Impressions(s) / UC Diagnoses   Final diagnoses:  Altered mental status, unspecified altered mental status type     Discharge Instructions      Go to the ER if there are any new or worsening symptoms that occur    ED Prescriptions   None    PDMP not reviewed this encounter.   Radford Pax, NP 12/21/22 2009

## 2022-12-21 NOTE — Discharge Instructions (Addendum)
He is go to the emergency room for further evaluation of symptoms

## 2022-12-21 NOTE — ED Triage Notes (Signed)
Patient here POV from Home with Family.  Endorses Hallucination noted today ("seeing people in her house that weren't there" along with Confusion such as leaving the TV on and leaving the Door open.  LSN: Friday.  No New Pain. No SOB.   NAD Noted during Triage. Answer Questions appropriately. Ambulatory.

## 2022-12-22 ENCOUNTER — Observation Stay (HOSPITAL_BASED_OUTPATIENT_CLINIC_OR_DEPARTMENT_OTHER)
Admission: EM | Admit: 2022-12-22 | Discharge: 2022-12-25 | Disposition: A | Discharge status: Skilled Nursing Facility | Payer: Medicare Other | Attending: Family Medicine | Admitting: Family Medicine

## 2022-12-22 ENCOUNTER — Encounter (HOSPITAL_COMMUNITY): Payer: Self-pay | Admitting: Internal Medicine

## 2022-12-22 ENCOUNTER — Emergency Department (HOSPITAL_BASED_OUTPATIENT_CLINIC_OR_DEPARTMENT_OTHER): Payer: Medicare Other

## 2022-12-22 DIAGNOSIS — R4182 Altered mental status, unspecified: Secondary | ICD-10-CM | POA: Diagnosis present

## 2022-12-22 DIAGNOSIS — N179 Acute kidney failure, unspecified: Secondary | ICD-10-CM | POA: Diagnosis not present

## 2022-12-22 DIAGNOSIS — G9340 Encephalopathy, unspecified: Secondary | ICD-10-CM | POA: Diagnosis present

## 2022-12-22 DIAGNOSIS — I129 Hypertensive chronic kidney disease with stage 1 through stage 4 chronic kidney disease, or unspecified chronic kidney disease: Secondary | ICD-10-CM | POA: Diagnosis present

## 2022-12-22 DIAGNOSIS — N189 Chronic kidney disease, unspecified: Secondary | ICD-10-CM

## 2022-12-22 DIAGNOSIS — R410 Disorientation, unspecified: Secondary | ICD-10-CM | POA: Diagnosis not present

## 2022-12-22 DIAGNOSIS — N1832 Chronic kidney disease, stage 3b: Secondary | ICD-10-CM | POA: Diagnosis not present

## 2022-12-22 DIAGNOSIS — I951 Orthostatic hypotension: Secondary | ICD-10-CM | POA: Diagnosis not present

## 2022-12-22 DIAGNOSIS — E039 Hypothyroidism, unspecified: Secondary | ICD-10-CM | POA: Diagnosis present

## 2022-12-22 DIAGNOSIS — E860 Dehydration: Secondary | ICD-10-CM | POA: Diagnosis not present

## 2022-12-22 DIAGNOSIS — F418 Other specified anxiety disorders: Secondary | ICD-10-CM | POA: Diagnosis not present

## 2022-12-22 HISTORY — DX: Anxiety disorder, unspecified: F41.9

## 2022-12-22 HISTORY — DX: Hypothyroidism, unspecified: E03.9

## 2022-12-22 LAB — URINALYSIS, ROUTINE W REFLEX MICROSCOPIC
Bilirubin Urine: NEGATIVE
Glucose, UA: NEGATIVE mg/dL
Hgb urine dipstick: NEGATIVE
Ketones, ur: NEGATIVE mg/dL
Leukocytes,Ua: NEGATIVE
Nitrite: NEGATIVE
Protein, ur: NEGATIVE mg/dL
Specific Gravity, Urine: 1.011 (ref 1.005–1.030)
pH: 6.5 (ref 5.0–8.0)

## 2022-12-22 LAB — CORTISOL-AM, BLOOD: Cortisol - AM: 14.5 ug/dL (ref 6.7–22.6)

## 2022-12-22 LAB — CBC
HCT: 37.2 % (ref 36.0–46.0)
Hemoglobin: 12.5 g/dL (ref 12.0–15.0)
MCH: 30.5 pg (ref 26.0–34.0)
MCHC: 33.6 g/dL (ref 30.0–36.0)
MCV: 90.7 fL (ref 80.0–100.0)
Platelets: 230 10*3/uL (ref 150–400)
RBC: 4.1 MIL/uL (ref 3.87–5.11)
RDW: 13.6 % (ref 11.5–15.5)
WBC: 7.3 10*3/uL (ref 4.0–10.5)
nRBC: 0 % (ref 0.0–0.2)

## 2022-12-22 LAB — CREATININE, SERUM
Creatinine, Ser: 1.12 mg/dL — ABNORMAL HIGH (ref 0.44–1.00)
GFR, Estimated: 48 mL/min — ABNORMAL LOW (ref >60)

## 2022-12-22 LAB — TSH: TSH: 2.195 u[IU]/mL (ref 0.350–4.500)

## 2022-12-22 MED ORDER — LEVOTHYROXINE SODIUM 75 MCG PO TABS
75.0000 ug | ORAL_TABLET | Freq: Every day | ORAL | Status: DC
  Administered 2022-12-22 – 2022-12-25 (×3): 75 ug via ORAL
  Filled 2022-12-22: qty 3
  Filled 2022-12-22 (×3): qty 1

## 2022-12-22 MED ORDER — ENOXAPARIN SODIUM 30 MG/0.3ML IJ SOSY
30.0000 mg | PREFILLED_SYRINGE | INTRAMUSCULAR | Status: DC
  Administered 2022-12-22 – 2022-12-24 (×2): 30 mg via SUBCUTANEOUS
  Filled 2022-12-22 (×2): qty 0.3

## 2022-12-22 MED ORDER — SODIUM CHLORIDE 0.9 % IV SOLN: INTRAVENOUS | Status: AC

## 2022-12-22 MED ORDER — ENOXAPARIN SODIUM 40 MG/0.4ML IJ SOSY: 40.0000 mg | PREFILLED_SYRINGE | INTRAMUSCULAR | Status: DC

## 2022-12-22 MED ORDER — ACETAMINOPHEN 325 MG PO TABS
650.0000 mg | ORAL_TABLET | Freq: Four times a day (QID) | ORAL | Status: DC | PRN
  Administered 2022-12-23: 650 mg via ORAL
  Filled 2022-12-22: qty 2

## 2022-12-22 MED ORDER — ACETAMINOPHEN 650 MG RE SUPP: 650.0000 mg | Freq: Four times a day (QID) | RECTAL | Status: DC | PRN

## 2022-12-22 MED ORDER — DICLOFENAC SODIUM 1 % EX GEL: 4.0000 g | Freq: Four times a day (QID) | CUTANEOUS | Status: DC | PRN

## 2022-12-22 MED ORDER — SODIUM CHLORIDE 0.9 % IV BOLUS
1000.0000 mL | Freq: Once | INTRAVENOUS | Status: AC
  Administered 2022-12-22: 1000 mL via INTRAVENOUS

## 2022-12-22 MED ORDER — AMLODIPINE BESYLATE 2.5 MG PO TABS
2.5000 mg | ORAL_TABLET | Freq: Every day | ORAL | Status: DC
  Administered 2022-12-22 – 2022-12-25 (×4): 2.5 mg via ORAL
  Filled 2022-12-22 (×4): qty 1

## 2022-12-22 MED ORDER — SODIUM CHLORIDE 0.9% FLUSH
3.0000 mL | Freq: Two times a day (BID) | INTRAVENOUS | Status: DC
  Administered 2022-12-22 – 2022-12-25 (×6): 3 mL via INTRAVENOUS

## 2022-12-22 NOTE — H&P (Signed)
History and Physical    Kimberly Frye:[id number] DOB: June 13, 1933 DOA: 12/22/2022  PCP: Fatima Sanger, FNP  Patient coming from: Home   Chief Complaint: Confusion, hallucinations  HPI: Kimberly Frye is a 89 y.o. female with medical history significant of hypothyroidism and anxiety.   Kimberly Frye is very high functioning, lives on her own and has limited medical history.  She notes to me that she is not sure why she is here, but she feels her family thinks she needs to be here.  Otherwise, she had no acute complaints and no symptoms.  She does note that she has recently been off and on sertraline and has been on sythroid for over 40 years.   I called and discussed with her son and daughter in law for secondary history.  They report seeing her last Thursday and she was in her normal state of health.  They even observed her driving, however, a friend of Kimberly Frye's called them on Monday and noted that Kimberly Frye was acting odd at [employer].  Patient and friends eat together at this restaurant every Monday and the staff know her well.  The friend reports that Kimberly Frye took a while to get out of the car and when she got in [employer] noted that she was "there to see a man."  She thought this man was the friend's husband.  Her friend called the family and then 911.  The patients nephew checked on her and noted that she was fine, just with some balance and vision issues.   The family reports that at baseline she does not like to take meds.  Over the past 6 months, she has stopped her synthroid (then restarted with prompting), stopped her sertraline, last known taking about 3 weeks ago.  They also noted that on return home from [place] yesterday (family took her home), the door to the house was open and all the lights were on, which was not normal for the patient.    Patient had reported possibly hearing voices in her house (denied to me) and her family thinks she may have been  hearing the TV.    Kimberly Frye denies all symptoms, denies feeling confused, remembering the incident in the restaurant or seeing/hearing things that others don't.  She was not forthcoming about when she last took her medications.   Her daughter is not sure if she is taking her medications at this time.   ED Course: In the ED, she was noted to have a Cr of 1.57, BUN/Cr ratio of 22, H/H mildly low.  TSH is normal at 2.1.  UA was normal.  CT head showed chronic changes only.   Review of Systems: As per HPI otherwise all other systems reviewed and are negative.   Past Medical History:  Diagnosis Date   Anxiety    Hypothyroidism    She reports this was all "so long ago" and cannot tell me what surgeries she has had.  History reviewed. No pertinent surgical history.  Social History  reports that she has never smoked. She has never been exposed to tobacco smoke. She has never used smokeless tobacco. She reports current alcohol use. She reports that she does not use drugs.  Allergies  Allergen Reactions   Citalopram Hydrobromide     Other reaction(s): "in a daze"   Nebivolol Hcl     Other reaction(s): le edema   Penicillins    She is not aware of her parents medical  history.  No family history on file.   Prior to Admission medications   Medication Sig Start Date End Date Taking? Authorizing Provider  sertraline (ZOLOFT) 25 MG tablet Take 25 mg by mouth daily. 10/12/22  Yes [provider]  cetirizine (ZYRTEC ALLERGY) 10 MG tablet 1 tablet 01/27/19   [provider]         diclofenac Sodium (VOLTAREN) 1 % GEL Apply 4 g topically 4 (four) times daily as needed. 10/24/20   Hilts, Michael, MD                                     SYNTHROID 75 MCG tablet Take 75 mcg by mouth daily. 09/02/20   [provider]           Physical Exam: Vitals:   12/22/22 1125 12/22/22 1130 12/22/22 1353 12/22/22 1402  BP: (!) 174/64 (!) 166/62  (!) 178/87  Pulse: 65 62 68    Resp: 12 12 14    Temp:   97.7 F (36.5 C)   TempSrc:   Oral   SpO2: 100% 100%    Weight:      Height:        Constitutional: NAD, calm, comfortable Eyes: lids and conjunctivae normal ENMT: Mucous membranes are mildly dry.  Neck: normal, supple Respiratory: ctab, no wheezing or rales Cardiovascular: RR, NR, no murmur Abdomen: Thin, +BS, NT, ND Musculoskeletal: normal tone and bulk Skin: healing hematoma on left leg Neurologic: Grossly intact, moving easily in bed, no fasciculations.   Psychiatric: affect is flat, alert, oriented to person/place/time.   Labs on Admission: I have personally reviewed following labs and imaging studies  CBC: Recent Labs  Lab 12/21/22 2249 12/22/22 1411  WBC 6.8 7.3  HGB 11.4* 12.5  HCT 33.6* 37.2  MCV 89.8 90.7  PLT 233 230    Basic Metabolic Panel: Recent Labs  Lab 12/21/22 2249 12/22/22 1411  NA 138  --   K 4.3  --   CL 102  --   CO2 27  --   GLUCOSE 119*  --   BUN 35*  --   CREATININE 1.57* 1.12*  CALCIUM 10.0  --     GFR: Estimated Creatinine Clearance: 25.4 mL/min (A) (by C-G formula based on SCr of 1.12 mg/dL (H)).  Liver Function Tests: Recent Labs  Lab 12/21/22 2249  AST 19  ALT 9  ALKPHOS 83  BILITOT 0.4  PROT 7.3  ALBUMIN 4.5    Urine analysis:    Component Value Date/Time   COLORURINE COLORLESS (A) 12/22/2022 1039   APPEARANCEUR CLEAR 12/22/2022 1039   LABSPEC 1.011 12/22/2022 1039   PHURINE 6.5 12/22/2022 1039   GLUCOSEU NEGATIVE 12/22/2022 1039   HGBUR NEGATIVE 12/22/2022 1039   BILIRUBINUR NEGATIVE 12/22/2022 1039   BILIRUBINUR negative 12/21/2022 1926   KETONESUR NEGATIVE 12/22/2022 1039   PROTEINUR NEGATIVE 12/22/2022 1039   UROBILINOGEN 0.2 12/21/2022 1926   NITRITE NEGATIVE 12/22/2022 1039   LEUKOCYTESUR NEGATIVE 12/22/2022 1039    Radiological Exams on Admission: CT HEAD WO CONTRAST ( )  Result Date: 12/22/2022 CLINICAL DATA:  Delirium EXAM: CT HEAD WITHOUT CONTRAST TECHNIQUE:  Contiguous axial images were obtained from the base of the skull through the vertex without intravenous contrast. RADIATION DOSE REDUCTION: This exam was performed according to the departmental dose-optimization program which includes automated exposure control, adjustment of the mA and/or kV according to patient size and/or  use of iterative reconstruction technique. COMPARISON:  CT head 04/15/2011 FINDINGS: Brain: No intracranial hemorrhage, mass effect, or evidence of acute infarct. No hydrocephalus. No extra-axial fluid collection. Generalized cerebral atrophy. Ill-defined hypoattenuation within the cerebral white matter is nonspecific but consistent with chronic small vessel ischemic disease. Vascular: No hyperdense vessel. Intracranial arterial calcification. Skull: No fracture or focal lesion. Sinuses/Orbits: No acute finding. Paranasal sinuses and mastoid air cells are well aerated. Other: None. IMPRESSION: 1. No evidence of acute intracranial abnormality. 2. Chronic small vessel ischemic disease and cerebral atrophy. Electronically Signed   By: Minerva Fester M.D.   On: 12/22/2022 02:08    EKG: Independently reviewed. NSR  Assessment/Plan  Encephalopathy, toxic vs metabolic Dehydration - Check orthostatics - positive with symptoms - IVF with NS at 100cc/hr - Regular diet  - TSH normal - Continue to hold sertraline - Trend Creatinine - Will check AM cortisol - Monitor on telemetry - PT/OT evaluation  Chronic kidney disease due to hypertension - IVF as noted, family is concerned she does not drink enough water - Recheck BMET in the AM    Hypothyroidism - TSH 2.1 - continue synthroid    Mixed anxiety and depressive disorder - Hold sertraline, consider psychiatry consult if not improving.   DVT prophylaxis: Lovenox  Code Status:   Full  Family Communication:  Son and daughter in law on the phone  Disposition Plan:   Patient is from:  Home  Anticipated DC to:  TBD  Anticipated  DC date:  12/23/22  Anticipated DC barriers: Ability to work with PT/OT  Consults called:  None Admission status:  Obs, med telemetry   Severity of Illness: The appropriate patient status for this patient is OBSERVATION. Observation status is judged to be reasonable and necessary in order to provide the required intensity of service to ensure the patient's safety. The patient's presenting symptoms, physical exam findings, and initial radiographic and laboratory data in the context of their medical condition is felt to place them at decreased risk for further clinical deterioration. Furthermore, it is anticipated that the patient will be medically stable for discharge from the hospital within 2 midnights of admission.     Debe Coder MD [facility]  How to contact the [facility] Attending or Consulting provider 7A - 7P or covering provider during after hours 7P -7A, for this patient?   Check the care team in [facility] and look for a) attending/consulting [facility] provider listed and b) the [facility] team listed Log into [email or web address] and use [place]'s universal password to access. If you do not have the password, please contact the hospital operator. Locate the [facility] provider you are looking for under [facility] and page to a number that you can be directly reached. If you still have difficulty reaching the provider, please page the [facility] (Director on Call) for the Hospitalists listed on amion for assistance.  12/22/2022, 3:49 PM

## 2022-12-22 NOTE — ED Notes (Signed)
Patient resting quietly in stretcher with eyes closed, respirations even, unlabored, no acute distress noted.  

## 2022-12-22 NOTE — ED Notes (Signed)
Pt unable to urinate at this time.  

## 2022-12-22 NOTE — ED Notes (Signed)
Pt's daughter in law, Crystallee Werden, called to make sure we knew that pt has a partial denture. She also left her contact # [phone]

## 2022-12-22 NOTE — ED Provider Notes (Signed)
[facility] Emergency Department Provider Note MRN:  [id number]  Arrival date & time: 12/22/22     Chief Complaint   Altered Mental Status   History of Present Illness   Kimberly Frye is a 89 y.o. year-old female with a history of anxiety, hypothyroidism, hypertension presenting to the ED with chief complaint of altered mental status.  Change to mental status over the past 24 hours or so.  Had an episode of hallucination earlier today, thought there were several people in her home.  She is also been leaving the door open, much more forgetful than normal.  Has had a bit of a cognitive decline over the past 6 months.  But this is much worse.  No falls, no fever, no complaints at this time.  Review of Systems  A thorough review of systems was obtained and all systems are negative except as noted in the HPI and PMH.   Patient's Health History   No past medical history on file.  No past surgical history on file.  No family history on file.  Social History   Socioeconomic History   Marital status: Single    Spouse name: Not on file   Number of children: Not on file   Years of education: Not on file   Highest education level: Not on file  Occupational History   Not on file  Tobacco Use   Smoking status: Never   Smokeless tobacco: Never  Substance and Sexual Activity   Alcohol use: Not on file   Drug use: Not on file   Sexual activity: Not on file  Other Topics Concern   Not on file  Social History Narrative   Not on file   Social Determinants of Health   Financial Resource Strain: Not on file  Food Insecurity: Not on file  Transportation Needs: Not on file  Physical Activity: Not on file  Stress: Not on file  Social Connections: Not on file  Intimate Partner Violence: Not on file     Physical Exam   Vitals:   12/21/22 2244 12/22/22 0130  BP: (!) 162/64 (!) 164/55  Pulse: 73 69  Resp: 18 20  Temp: (!) 97.5 F (36.4 C)   SpO2: 100%  99%    CONSTITUTIONAL: Well-appearing, NAD NEURO/PSYCH:  Alert and oriented to name and time but not location, normal and symmetric strength and sensation, speech is a bit slow EYES:  eyes equal and reactive ENT/NECK:  no LAD, no JVD CARDIO: Regular rate, well-perfused, normal S1 and S2 PULM:  CTAB no wheezing or rhonchi GI/GU:  non-distended, non-tender MSK/SPINE:  No gross deformities, no edema SKIN:  no rash, atraumatic   *Additional and/or pertinent findings included in MDM below  Diagnostic and Interventional Summary    EKG Interpretation  Date/Time:  Monday December 21 2022 22:48:27 EDT Ventricular Rate:  72 PR Interval:  136 QRS Duration: 76 QT Interval:  384 QTC Calculation: 420 R Axis:   69 Text Interpretation: Normal sinus rhythm Normal ECG When compared with ECG of 14-Mar-2008 11:35, No significant change was found Confirmed by Kennis Carina [phone]) on 12/22/2022 1:27:51 AM       Labs Reviewed  COMPREHENSIVE METABOLIC PANEL - Abnormal; Notable for the following components:      Result Value   Glucose, Bld 119 (*)    BUN 35 (*)    Creatinine, Ser 1.57 (*)    GFR, Estimated 32 (*)    All other components within normal limits  CBC - Abnormal; Notable for the following components:   RBC 3.74 (*)    Hemoglobin 11.4 (*)    HCT 33.6 (*)    All other components within normal limits  CBG MONITORING, ED - Abnormal; Notable for the following components:   Glucose-Capillary 116 (*)    All other components within normal limits  URINALYSIS, ROUTINE W REFLEX MICROSCOPIC    CT HEAD WO CONTRAST ( )  Final Result      Medications  sodium chloride 0.9 % bolus 1,000 mL (has no administration in time range)     Procedures  /  Critical Care Procedures  ED Course and Medical Decision Making  Initial Impression and Ddx Patient seems to be mildly globally confused, favoring a mild delirium of unclear cause.  Differential diagnosis includes electrolyte disarray, UTI,  subdural hematoma.  Also worsening of underlying dementia is considered.  Past medical/surgical history that increases complexity of ED encounter: Hypertension, hypothyroidism  Interpretation of Diagnostics I personally reviewed the EKG and my interpretation is as follows: Sinus rhythm  Elevated BUN and creatinine suggestive of dehydration, otherwise no significant blood count or electrolyte disturbance.  Patient Reassessment and Ultimate Disposition/Management     Will admit for delirium, dehydration, acute kidney injury.  Patient management required discussion with the following services or consulting groups:  Hospitalist Service  Complexity of Problems Addressed Acute illness or injury that poses threat of life of bodily function  Additional Data Reviewed and Analyzed Further history obtained from: Further history from spouse/family member  Additional Factors Impacting ED Encounter Risk Consideration of hospitalization  Elmer Sow. Pilar Plate, MD [facility] Emergency Medicine [facility] [email or web address]  Final Clinical Impressions(s) / ED Diagnoses     ICD-10-CM   1. Delirium  R41.0       ED Discharge Orders     None        Discharge Instructions Discussed with and Provided to Patient:   Discharge Instructions   None      Sabas Sous, MD 12/22/22 0301

## 2022-12-22 NOTE — ED Notes (Signed)
Pt has glasses at bedside, family wants to ensure glasses go with patient.

## 2022-12-22 NOTE — Progress Notes (Signed)
Plan of Care Note for accepted transfer   Patient: Kimberly Frye MRN: [id number]   DOA: 12/22/2022  Facility requesting transfer: [facility] Requesting Provider: Dr. Pilar Plate Reason for transfer: AMS Facility course: 89 year old female with past medical history of anxiety, hypothyroidism, hypertension presented to ED for evaluation of confusion and hallucinations over the past 24 hours. Family reported cognitive decline x 6 months. Afebrile in the ED.  Labs showing no leukocytosis, hemoglobin 11.4, sodium 138, glucose 119, BUN 35, creatinine 1.5 (baseline unknown), UA pending.  CT head negative for acute intracranial abnormality. Patient received 1 L normal saline bolus.  Plan of care: The patient is accepted for admission to Telemetry unit, at [facility].  [facility] will assume care on arrival to accepting facility. Until arrival, care as per EDP. However, [facility] available 24/7 for questions and assistance.  Author: John Giovanni, MD 12/22/2022  Check [email or web address] for on-call coverage.  Nursing staff, Please call [facility] Admits & Consults System-Wide number on Amion as soon as patient's arrival, so appropriate admitting provider can evaluate the pt.

## 2022-12-22 NOTE — ED Notes (Signed)
[facility] called for transport

## 2022-12-23 DIAGNOSIS — G9340 Encephalopathy, unspecified: Secondary | ICD-10-CM

## 2022-12-23 DIAGNOSIS — N179 Acute kidney failure, unspecified: Secondary | ICD-10-CM | POA: Diagnosis present

## 2022-12-23 DIAGNOSIS — I129 Hypertensive chronic kidney disease with stage 1 through stage 4 chronic kidney disease, or unspecified chronic kidney disease: Secondary | ICD-10-CM

## 2022-12-23 DIAGNOSIS — I951 Orthostatic hypotension: Secondary | ICD-10-CM | POA: Insufficient documentation

## 2022-12-23 LAB — IRON AND TIBC
Iron: 67 ug/dL (ref 28–170)
Saturation Ratios: 19 % (ref 10.4–31.8)
TIBC: 361 ug/dL (ref 250–450)
UIBC: 294 ug/dL

## 2022-12-23 LAB — CBC
HCT: 32.6 % — ABNORMAL LOW (ref 36.0–46.0)
Hemoglobin: 10.7 g/dL — ABNORMAL LOW (ref 12.0–15.0)
MCH: 29.4 pg (ref 26.0–34.0)
MCHC: 32.8 g/dL (ref 30.0–36.0)
MCV: 89.6 fL (ref 80.0–100.0)
Platelets: 231 10*3/uL (ref 150–400)
RBC: 3.64 MIL/uL — ABNORMAL LOW (ref 3.87–5.11)
RDW: 13.6 % (ref 11.5–15.5)
WBC: 6.1 10*3/uL (ref 4.0–10.5)
nRBC: 0 % (ref 0.0–0.2)

## 2022-12-23 LAB — BASIC METABOLIC PANEL
Anion gap: 6 (ref 5–15)
BUN: 20 mg/dL (ref 8–23)
CO2: 24 mmol/L (ref 22–32)
Calcium: 9.2 mg/dL (ref 8.9–10.3)
Chloride: 107 mmol/L (ref 98–111)
Creatinine, Ser: 1.11 mg/dL — ABNORMAL HIGH (ref 0.44–1.00)
GFR, Estimated: 48 mL/min — ABNORMAL LOW (ref >60)
Glucose, Bld: 87 mg/dL (ref 70–99)
Potassium: 3.8 mmol/L (ref 3.5–5.1)
Sodium: 137 mmol/L (ref 135–145)

## 2022-12-23 LAB — VITAMIN B12: Vitamin B-12: 512 pg/mL (ref 180–914)

## 2022-12-23 LAB — FERRITIN: Ferritin: 36 ng/mL (ref 11–307)

## 2022-12-23 MED ORDER — COSYNTROPIN 0.25 MG IJ SOLR
0.2500 mg | Freq: Once | INTRAMUSCULAR | Status: AC
  Administered 2022-12-24: 0.25 mg via INTRAVENOUS
  Filled 2022-12-23: qty 0.25

## 2022-12-23 NOTE — Care Management Obs Status (Signed)
MEDICARE OBSERVATION STATUS NOTIFICATION   Patient Details  Name: Kimberly Frye MRN: [id number] Date of Birth: April 08, 1933   Medicare Observation Status Notification Given:  Yes    Kermit Balo, RN 12/23/2022, 3:52 PM

## 2022-12-23 NOTE — Plan of Care (Signed)
  Problem: Health Behavior/Discharge Planning: Goal: Ability to manage health-related needs will improve Outcome: Progressing   Problem: Activity: Goal: Risk for activity intolerance will decrease Outcome: Progressing   Problem: Nutrition: Goal: Adequate nutrition will be maintained Outcome: Progressing   Problem: Coping: Goal: Level of anxiety will decrease Outcome: Progressing   Problem: Elimination: Goal: Will not experience complications related to bowel motility Outcome: Progressing Goal: Will not experience complications related to urinary retention Outcome: Progressing   Problem: Pain Managment: Goal: General experience of comfort will improve Outcome: Progressing   Problem: Safety: Goal: Ability to remain free from injury will improve Outcome: Progressing   

## 2022-12-23 NOTE — Progress Notes (Addendum)
Progress Note    Kimberly Frye  ZOX:[id number] DOB: 04-14-1933  DOA: 12/22/2022 PCP: Fatima Sanger, FNP      Brief Narrative:    Medical records reviewed and are as summarized below:  Kimberly Frye is a 89 y.o. female with medical history significant for anxiety, depression, CKD stage III, hypertension, hypothyroidism, who was brought to the hospital because of confusion and hallucinations.  Family reported seeing her last Thursday and she was in her normal state of health. They even observed her driving, however, a friend of Kimberly Frye's called them on Monday and noted that Kimberly Frye was acting odd at [employer]. Patient and friends eat together at this restaurant every Monday and the staff know her well. The friend reports that Kimberly Frye took a while to get out of the car and when she got in [employer] noted that she was "there to see a man." She thought this man was the friend's husband. Her friend called the family and then 911. The patients nephew checked on her and noted that she was fine, just with some balance and vision issues.  Family also reported that patient has been hearing voices in her house and they think she may be hearing the TV       Assessment/Plan:   Principal Problem:   Encephalopathy Active Problems:   Chronic kidney disease due to hypertension   Hypothyroidism   Mixed anxiety and depressive disorder   AKI (acute kidney injury) (HCC)   Orthostatic hypotension    Body mass index is 17.71 kg/m.   Probable acute metabolic encephalopathy: Improving but not back to baseline. Check vitamin B12 level   AKI on CKD stage IIIb, dehydration: Improved with IV fluids.  Creatinine down from 1.57-1.11.  Family said her creatinine was 1.19 and GFR 41 in January 2024 and creatinine 1.29 and GFR 37 in June 2023.   Orthostatic hypotension: 12/22/2022, lying BP was 181/67, standing BP at 0 minutes 133/78, standing BP at 3 minutes was 138/61.   Repeat orthostatic vital signs 12/23/2022: BP 130/57, standing BP at 0 minutes 122/57, standing BP at 3 minutes 106/60. Cortisol level 14.5.  Check ACTH stimulation test   Hypothyroidism: TSH 2.1.  Continue Synthroid   Hypertension: BP is better.  Continue amlodipine   General weakness: PT and OT recommended discharge to SNF.   Family had concerns about swallowing which has been a problem for some time now.  Consult speech therapist for swallow evaluation.   Other comorbidities include anxiety and depression   I spoke with Onalee Hua, her son, and Darl Pikes, daughter-in-law, to obtain more information with respect to previous medications, functional level and lab work.  Plan of care was discussed as well.  Diet Order             Diet regular Room service appropriate? No; Fluid consistency: Thin  Diet effective now                            Consultants: None  Procedures: None    Medications:    amLODipine  2.5 mg Oral Daily   [START ON 12/24/2022] cosyntropin  0.25 mg Intravenous Once   enoxaparin (LOVENOX) injection  30 mg Subcutaneous Q24H   levothyroxine  75 mcg Oral Q0600   sodium chloride flush  3 mL Intravenous Q12H   Continuous Infusions:   Anti-infectives (From admission, onward)    None  Family Communication/Anticipated D/C date and plan/Code Status   DVT prophylaxis: enoxaparin (LOVENOX) injection 30 mg Start: 12/22/22 1430     Code Status: Full Code  Family Communication: Plan discussed with Onalee Hua (son) and Darl Pikes (daughter-in-law) Disposition Plan: Plan to discharge home in 1 to 2 days   Status is: Observation The patient will require care spanning > 2 midnights and should be moved to inpatient because: Confusion and not back to baseline       Subjective:   Interval history noted.  No complaints.  She said she feels okay and she does not know why she was brought to the hospital.  Objective:    Vitals:    12/23/22 0311 12/23/22 0757 12/23/22 1200 12/23/22 1500  BP: (!) 150/70  (!) 134/59   Pulse: 81  84   Resp: 16  15 15   Temp: 97.9 F (36.6 C) 98.3 F (36.8 C)    TempSrc:  Oral    SpO2: 96%   100%  Weight:      Height:       Orthostatic VS for the past 24 hrs:  BP- Lying Pulse- Lying BP- Sitting Pulse- Sitting BP- Standing at 0 minutes Pulse- Standing at 0 minutes  12/23/22 1500 130/57 76 136/67 85 122/57 82     Intake/Output Summary (Last 24 hours) at 12/23/2022 1549 Last data filed at 12/23/2022 1610 Gross per 24 hour  Intake 198.32 ml  Output 1400 ml  Net -1201.68 ml   Filed Weights   12/22/22 1056  Weight: 45.4 kg    Exam:  GEN: NAD SKIN: Warm and dry EYES: No pallor or icterus ENT: MMM CV: RRR PULM: CTA B ABD: soft, ND, NT, +BS CNS: AAO x 2 (she does not know what day it is.  She cannot say the months of the year backwards., non focal EXT: No edema or tenderness        Data Reviewed:   I have personally reviewed following labs and imaging studies:  Labs: Labs show the following:   Basic Metabolic Panel: Recent Labs  Lab 12/21/22 2249 12/22/22 1411 12/23/22 0240  NA 138  --  137  K 4.3  --  3.8  CL 102  --  107  CO2 27  --  24  GLUCOSE 119*  --  87  BUN 35*  --  20  CREATININE 1.57* 1.12* 1.11*  CALCIUM 10.0  --  9.2   GFR Estimated Creatinine Clearance: 25.6 mL/min (A) (by C-G formula based on SCr of 1.11 mg/dL (H)). Liver Function Tests: Recent Labs  Lab 12/21/22 2249  AST 19  ALT 9  ALKPHOS 83  BILITOT 0.4  PROT 7.3  ALBUMIN 4.5   No results for input(s): "LIPASE", "AMYLASE" in the last 168 hours. No results for input(s): "AMMONIA" in the last 168 hours. Coagulation profile No results for input(s): "INR", "PROTIME" in the last 168 hours.  CBC: Recent Labs  Lab 12/21/22 2249 12/22/22 1411 12/23/22 0240  WBC 6.8 7.3 6.1  HGB 11.4* 12.5 10.7*  HCT 33.6* 37.2 32.6*  MCV 89.8 90.7 89.6  PLT 233 230 231   Cardiac  Enzymes: No results for input(s): "CKTOTAL", "CKMB", "CKMBINDEX", "TROPONINI" in the last 168 hours. BNP (last 3 results) No results for input(s): "PROBNP" in the last 8760 hours. CBG: Recent Labs  Lab 12/21/22 2253  GLUCAP 116*   D-Dimer: No results for input(s): "DDIMER" in the last 72 hours. Hgb A1c: No results for input(s): "HGBA1C" in the  last 72 hours. Lipid Profile: No results for input(s): "CHOL", "HDL", "LDLCALC", "TRIG", "CHOLHDL", "LDLDIRECT" in the last 72 hours. Thyroid function studies: Recent Labs    12/22/22 1411  TSH 2.195   Anemia work up: No results for input(s): "VITAMINB12", "FOLATE", "FERRITIN", "TIBC", "IRON", "RETICCTPCT" in the last 72 hours. Sepsis Labs: Recent Labs  Lab 12/21/22 2249 12/22/22 1411 12/23/22 0240  WBC 6.8 7.3 6.1    Microbiology No results found for this or any previous visit (from the past 240 hour(s)).  Procedures and diagnostic studies:  CT HEAD WO CONTRAST ( )  Result Date: 12/22/2022 CLINICAL DATA:  Delirium EXAM: CT HEAD WITHOUT CONTRAST TECHNIQUE: Contiguous axial images were obtained from the base of the skull through the vertex without intravenous contrast. RADIATION DOSE REDUCTION: This exam was performed according to the departmental dose-optimization program which includes automated exposure control, adjustment of the mA and/or kV according to patient size and/or use of iterative reconstruction technique. COMPARISON:  CT head 04/15/2011 FINDINGS: Brain: No intracranial hemorrhage, mass effect, or evidence of acute infarct. No hydrocephalus. No extra-axial fluid collection. Generalized cerebral atrophy. Ill-defined hypoattenuation within the cerebral white matter is nonspecific but consistent with chronic small vessel ischemic disease. Vascular: No hyperdense vessel. Intracranial arterial calcification. Skull: No fracture or focal lesion. Sinuses/Orbits: No acute finding. Paranasal sinuses and mastoid air cells are well  aerated. Other: None. IMPRESSION: 1. No evidence of acute intracranial abnormality. 2. Chronic small vessel ischemic disease and cerebral atrophy. Electronically Signed   By: Minerva Fester M.D.   On: 12/22/2022 02:08               LOS: 0 days   Jayion Schneck  [facility]   Pager on [email or web address]. If 7PM-7AM, please contact night-coverage at [email or web address]     12/23/2022, 3:49 PM

## 2022-12-23 NOTE — NC FL2 (Signed)
[place] MEDICAID FL2 LEVEL OF CARE FORM     IDENTIFICATION  Patient Name: Kimberly Frye Birthdate: 1933-01-04 Sex: female Admission Date (Current Location): 12/22/2022  [facility] and [place] Number:  Producer, television/film/video and Address:  [facility], [address] [place], [place] [address]      Provider Number: [id number]  Attending Physician Name and Address:  Lurene Shadow, MD  Relative Name and Phone Number:       Current Level of Care: Hospital Recommended Level of Care: Skilled Nursing Facility Prior Approval Number:    Date Approved/Denied:   PASRR Number: pending  Discharge Plan: SNF    Current Diagnoses: Patient Active Problem List   Diagnosis Date Noted   AKI (acute kidney injury) (HCC) 12/23/2022   Orthostatic hypotension 12/23/2022   AMS (altered mental status) 12/22/2022   Encephalopathy 12/22/2022   Abdominal aortic atherosclerosis (HCC) 10/21/2020   Adjustment disorder 10/21/2020   Allergic rhinitis 10/21/2020   Chronic kidney disease due to hypertension 10/21/2020   Essential hypertension 10/21/2020   History of malignant neoplasm of skin 10/21/2020   Hyperlipidemia 10/21/2020   Hypothyroidism 10/21/2020   Menopausal flushing 10/21/2020   Mild anxiety 10/21/2020   Mixed anxiety and depressive disorder 10/21/2020   Pure hypercholesterolemia 10/21/2020   Tobacco user 10/21/2020   Vitamin D deficiency 10/21/2020    Orientation RESPIRATION BLADDER Height & Weight     Self, Place  Normal Continent Weight: 45.4 kg Height:  5\' 3"  (160 cm)  BEHAVIORAL SYMPTOMS/MOOD NEUROLOGICAL BOWEL NUTRITION STATUS      Continent Diet (Regular with thin liquids)  AMBULATORY STATUS COMMUNICATION OF NEEDS Skin   Limited Assist Verbally Normal                       Personal Care Assistance Level of Assistance  Bathing, Feeding, Dressing Bathing Assistance: Limited assistance Feeding assistance: Limited assistance Dressing  Assistance: Limited assistance     Functional Limitations Info  Sight, Hearing, Speech Sight Info: Impaired Hearing Info: Adequate Speech Info: Adequate    SPECIAL CARE FACTORS FREQUENCY  PT (By licensed PT), OT (By licensed OT)     PT Frequency: 5x/wk OT Frequency: 5x/wk            Contractures Contractures Info: Not present    Additional Factors Info  Code Status, Allergies Code Status Info: Full Allergies Info: bystolic/ celexa/ penicillins           Current Medications (12/23/2022):  This is the current hospital active medication list Current Facility-Administered Medications  Medication Dose Route Frequency Provider Last Rate Last Admin   acetaminophen (TYLENOL) tablet 650 mg  650 mg Oral Q6H PRN Inez Catalina, MD   650 mg at 12/23/22 2202   Or   acetaminophen (TYLENOL) suppository 650 mg  650 mg Rectal Q6H PRN Inez Catalina, MD       amLODipine (NORVASC) tablet 2.5 mg  2.5 mg Oral Daily Debe Coder B, MD   2.5 mg at 12/23/22 0949   [START ON 12/24/2022] cosyntropin (CORTROSYN) injection 0.25 mg  0.25 mg Intravenous Once Lurene Shadow, MD       diclofenac Sodium (VOLTAREN) 1 % topical gel 4 g  4 g Topical QID PRN Debe Coder B, MD       enoxaparin (LOVENOX) injection 30 mg  30 mg Subcutaneous Q24H Debe Coder B, MD   30 mg at 12/22/22 1640   levothyroxine (SYNTHROID) tablet 75 mcg  75 mcg Oral Q0600 Inez Catalina, MD   75 mcg at 12/23/22 1610   sodium chloride flush (NS) 0.9 % injection 3 mL  3 mL Intravenous Q12H Inez Catalina, MD   3 mL at 12/23/22 9604     Discharge Medications: Please see discharge summary for a list of discharge medications.  Relevant Imaging Results:  Relevant Lab Results:   Additional Information SS#:  [id number]  Kermit Balo, RN

## 2022-12-23 NOTE — TOC Initial Note (Signed)
Transition of Care [facility]) - Initial/Assessment Note    Patient Details  Name: Kimberly Frye MRN: [id number] Date of Birth: 1932-07-31  Transition of Care [facility]) CM/SW Contact:    Kermit Balo, RN Phone Number: 12/23/2022, 4:07 PM  Clinical Narrative:                 Patient is from home alone. Says her son and daughter in law check on her and friends can check on her. She doesn't understand why she is here.  PT/OT recommending SNF. CM spoke with her son and daughter in law due to her confusion. They are in agreement but hope that the confusion gets better over the next 24 hours. They asked she be faxed out in the [facility] area. CM has also sent needed information to [facility] for a PASAR number.  Pt was driving self and managing her own medications prior to admission. Today she attempted to tie her R/L shoe strings together.  CM will provide bed offers to the family and patient tomorrow.  TOC following.  Expected Discharge Plan: Skilled Nursing Facility Barriers to Discharge: Continued Medical Work up   Patient Goals and CMS Choice   CMS Medicare.gov Compare Post Acute Care list provided to:: Patient Represenative (must comment) Choice offered to / list presented to : Patient, Adult Children      Expected Discharge Plan and Services   Discharge Planning Services: CM Consult Post Acute Care Choice: Skilled Nursing Facility Living arrangements for the past 2 months: Apartment                                      Prior Living Arrangements/Services Living arrangements for the past 2 months: Apartment Lives with:: Self Patient language and need for interpreter reviewed:: Yes Do you feel safe going back to the place where you live?: Yes        Care giver support system in place?: No (comment)   Criminal Activity/Legal Involvement Pertinent to Current Situation/Hospitalization: No - Comment as needed  Activities of Daily Living Home Assistive  Devices/Equipment: None ADL Screening (condition at time of admission) Patient's cognitive ability adequate to safely complete daily activities?: Yes Is the patient deaf or have difficulty hearing?: No Does the patient have difficulty seeing, even when wearing glasses/contacts?: No Does the patient have difficulty concentrating, remembering, or making decisions?: No Patient able to express need for assistance with ADLs?: Yes Does the patient have difficulty dressing or bathing?: No Independently performs ADLs?: Yes (appropriate for developmental age) Does the patient have difficulty walking or climbing stairs?: No Weakness of Legs: None Weakness of Arms/Hands: None  Permission Sought/Granted                  Emotional Assessment Appearance:: Appears stated age Attitude/Demeanor/Rapport: Engaged   Orientation: : Oriented to Self, Oriented to Place   Psych Involvement: No (comment)  Admission diagnosis:  Delirium [R41.0] AMS (altered mental status) [R41.82] Encephalopathy [G93.40] Patient Active Problem List   Diagnosis Date Noted   AKI (acute kidney injury) (HCC) 12/23/2022   Orthostatic hypotension 12/23/2022   AMS (altered mental status) 12/22/2022   Encephalopathy 12/22/2022   Abdominal aortic atherosclerosis (HCC) 10/21/2020   Adjustment disorder 10/21/2020   Allergic rhinitis 10/21/2020   Chronic kidney disease due to hypertension 10/21/2020   Essential hypertension 10/21/2020   History of malignant neoplasm of skin 10/21/2020   Hyperlipidemia 10/21/2020  Hypothyroidism 10/21/2020   Menopausal flushing 10/21/2020   Mild anxiety 10/21/2020   Mixed anxiety and depressive disorder 10/21/2020   Pure hypercholesterolemia 10/21/2020   Tobacco user 10/21/2020   Vitamin D deficiency 10/21/2020   PCP:  Fatima Sanger, FNP Pharmacy:   [id number] [place],  - [address] [place] [address] Phone: [phone] Fax:  [phone]     Social Determinants of Health (SDOH) Social History: SDOH Screenings   Food Insecurity: No Food Insecurity (12/22/2022)  Housing: Low Risk  (12/22/2022)  Transportation Needs: No Transportation Needs (12/22/2022)  Utilities: Not At Risk (12/22/2022)  Tobacco Use: Low Risk  (12/22/2022)   SDOH Interventions:     Readmission Risk Interventions     No data to display

## 2022-12-23 NOTE — Evaluation (Signed)
Occupational Therapy Evaluation Patient Details Name: Kimberly Frye MRN: [id number] DOB: Sep 25, 1932 Today's Date: 12/23/2022   History of Present Illness Pt is an 89 y/o F admitted on 12/22/22. Pt is being treated for encephalopathy (toxic vs metabolic) & dehydration. PMH: hypothyroidism, anxiety, HTN   Clinical Impression   PT admitted with encephalopathy . Pt currently with functional limitiations due to the deficits listed below (see OT problem list). Pt at baseline lives alone and drives. Pt reports she does all her medication, bills and grocery shopping independent. Pt demonstrates balance and cognitive deficits this session.  Pt will benefit from skilled OT to increase their independence and safety with adls and balance to allow discharge SNF or increased care in the home with 24/7 supervision. Pt is able to physically engage in functional task but cognitive deficits put pt at high risk for falls/ safety alone..       Recommendations for follow up therapy are one component of a multi-disciplinary discharge planning process, led by the attending physician.  Recommendations may be updated based on patient status, additional functional criteria and insurance authorization.   Assistance Recommended at Discharge Intermittent Supervision/Assistance  Patient can return home with the following A little help with walking and/or transfers;A little help with bathing/dressing/bathroom    Functional Status Assessment  Patient has had a recent decline in their functional status and demonstrates the ability to make significant improvements in function in a reasonable and predictable amount of time.  Equipment Recommendations  None recommended by OT    Recommendations for Other Services Speech consult     Precautions / Restrictions Precautions Precautions: Fall Restrictions Weight Bearing Restrictions: No      Mobility Bed Mobility Overal bed mobility: Needs Assistance Bed Mobility:  Supine to Sit     Supine to sit: Supervision          Transfers Overall transfer level: Needs assistance Equipment used: None Transfers: Sit to/from Stand Sit to Stand: Supervision           General transfer comment: pt with sway to the R initially. pt wondering around the room with poor recall of the task to be complete      Balance Overall balance assessment: Needs assistance Sitting-balance support: No upper extremity supported, Feet supported Sitting balance-Leahy Scale: Good     Standing balance support: No upper extremity supported, Reliant on assistive device for balance Standing balance-Leahy Scale: Fair                             ADL either performed or assessed with clinical judgement   ADL Overall ADL's : Needs assistance/impaired Eating/Feeding: Set up Eating/Feeding Details (indicate cue type and reason): backward chain task so the patient only had to self feed Grooming: Oral care;Min guard Grooming Details (indicate cue type and reason): min cues             Lower Body Dressing: Minimal assistance;Sit to/from stand Lower Body Dressing Details (indicate cue type and reason): pt needed safety correction with shoes. Toilet Transfer: Minimal assistance           Functional mobility during ADLs: Minimal assistance       Vision Baseline Vision/History: 1 Wears glasses Ability to See in Adequate Light: 0 Adequate Patient Visual Report: No change from baseline Vision Assessment?: Yes Eye Alignment: Within Functional Limits Ocular Range of Motion: Within Functional Limits Convergence: Within functional limits     Perception  Praxis      Pertinent Vitals/Pain Pain Assessment Pain Assessment: No/denies pain     Hand Dominance Right   Extremity/Trunk Assessment Upper Extremity Assessment Upper Extremity Assessment: Overall WFL for tasks assessed   Lower Extremity Assessment Lower Extremity Assessment: Overall WFL for  tasks assessed   Cervical / Trunk Assessment Cervical / Trunk Assessment: Normal   Communication Communication Communication: Other (comment) (word finding deficits)   Cognition Arousal/Alertness: Awake/alert Behavior During Therapy: Restless Overall Cognitive Status: Impaired/Different from baseline Area of Impairment: Orientation, Attention, Memory, Following commands, Safety/judgement, Awareness, Problem solving                 Orientation Level: Disoriented to, Place, Situation, Time Current Attention Level: Selective Memory: Decreased recall of precautions, Decreased short-term memory Following Commands: Follows one step commands with increased time, Follows multi-step commands inconsistently Safety/Judgement: Decreased awareness of safety, Decreased awareness of deficits Awareness: Intellectual Problem Solving: Slow processing (decreased word finding and dismissing question when unable to locate the word) General Comments: pt unaware of deficits. pt very angry on arrival that her family has not come to pick her up at yet. pt was unable initially to state where she was for them to come get her. She is perseverating on "i dont know why they even brought me here. i couild have waiting a month and seen my real doctors. I hate to be like this because its not your fault. its theirs" Pt unable to state the name of hospital but when given 3 choices able to pick the correct location. pt with x3 meals in her room and states "i can eat when i get home" OT backward chained the meal task and sat pt in chair with immediately initiating self feeding. pt states "oh this is my favorite meal here!" pt unaware she had declined the food just 5 minutes prior. Pt tied her R shoe lace to the L shoe outter most lace. Pt had her shoes literally tied together without awareness. pt asking several times during time with OT if her son sent OT to see her. And no recall of previous answer provided.     General  Comments       Exercises     Shoulder Instructions      Home Living Family/patient expects to be discharged to:: Private residence Living Arrangements: Alone Available Help at Discharge: Family;Friend(s);Available PRN/intermittently Type of Home: House Home Access: Stairs to enter [employer] of Steps: couple   Home Layout: Two level;Bed/bath upstairs Alternate Level Stairs-Number of Steps: flight   Bathroom Shower/Tub: Producer, television/film/video: Standard     Home Equipment: None   Additional Comments: townhouse      Prior Functioning/Environment Prior Level of Function : Independent/Modified Independent;Driving               ADLs Comments: pt reports she does all her cooking, cleaning without assistance.        OT Problem List: Impaired balance (sitting and/or standing);Decreased activity tolerance;Decreased cognition;Decreased safety awareness;Decreased knowledge of use of DME or AE;Decreased knowledge of precautions      OT Treatment/Interventions:      OT Goals(Current goals can be found in the care plan section) Acute Rehab OT Goals Patient Stated Goal: to go home OT Goal Formulation: Patient unable to participate in goal setting Time For Goal Achievement: 01/06/23 Potential to Achieve Goals: Good  OT Frequency: Min 2X/week    Co-evaluation  AM-PAC OT "6 Clicks" Daily Activity     Outcome Measure Help from another person eating meals?: A Little Help from another person taking care of personal grooming?: A Little Help from another person toileting, which includes using toliet, bedpan, or urinal?: None Help from another person bathing (including washing, rinsing, drying)?: A Lot Help from another person to put on and taking off regular upper body clothing?: A Little Help from another person to put on and taking off regular lower body clothing?: A Lot 6 Click Score: 17   End of Session Nurse Communication: Mobility  status;Precautions  Activity Tolerance: Patient tolerated treatment well Patient left: in chair;with call bell/phone within reach;with chair alarm set  OT Visit Diagnosis: Unsteadiness on feet (R26.81);Muscle weakness (generalized) (M62.81)                Time: 8469-6295 OT Time Calculation (min): 33 min Charges:  OT General Charges $OT Visit: 1 Visit OT Evaluation $OT Eval Moderate Complexity: 1 Mod OT Treatments $Self Care/Home Management : 8-22 mins   Brynn, OTR/L  Acute Rehabilitation Services Office: [phone] .   Mateo Flow 12/23/2022, 2:51 PM

## 2022-12-23 NOTE — Evaluation (Signed)
Physical Therapy Evaluation Patient Details Name: Kimberly Frye MRN: [id number] DOB: 10-27-1932 Today's Date: 12/23/2022  History of Present Illness  Pt is an 89 y/o F admitted on 12/22/22. Pt is being treated for encephalopathy (toxic vs metabolic) & dehydration. PMH: hypothyroidism, anxiety, HTN  Clinical Impression  Pt seen for PT evaluation with pt agreeable to tx. Pt presents with decreased cognition & awareness. Pt does note prior to admission she was living alone in a 2 level home, driving, ambulating without AD. On this date, pt requires CGA for short distance gait without AD; gait limited by c/o dizziness with mobility. BP checked & pt noted to be positive for orthostatic hypotension. Recommend ongoing PT services to maximize independence with mobility & reduce fall risk prior to return home alone; pt notes her son lives in [place] & only has PRN assist at home.  BP checked in LUE  BP HR  Sitting after gait  115/73 mmHg MAP 85 77 bpm  Stand at 0 102/46 mmHg MAP 61 85 bpm  Standing at 3 95/77 mmHg MAP 85 91 bpm  Sitting at end of session 121/66 mmHg MAP 82 70 bpm          Recommendations for follow up therapy are one component of a multi-disciplinary discharge planning process, led by the attending physician.  Recommendations may be updated based on patient status, additional functional criteria and insurance authorization.  Follow Up Recommendations Can patient physically be transported by private vehicle: Yes     Assistance Recommended at Discharge    Patient can return home with the following  A little help with walking and/or transfers;A little help with bathing/dressing/bathroom;Assistance with cooking/housework;Assist for transportation;Help with stairs or ramp for entrance;Direct supervision/assist for financial management    Equipment Recommendations None recommended by PT (TBD in next venue)  Recommendations for Other Services       Functional Status Assessment  Patient has had a recent decline in their functional status and demonstrates the ability to make significant improvements in function in a reasonable and predictable amount of time.     Precautions / Restrictions Precautions Precautions: Fall Restrictions Weight Bearing Restrictions: No      Mobility  Bed Mobility Overal bed mobility: Needs Assistance Bed Mobility: Supine to Sit     Supine to sit: Modified independent (Device/Increase time), HOB elevated          Transfers Overall transfer level: Needs assistance Equipment used: None Transfers: Sit to/from Stand Sit to Stand: Supervision           General transfer comment: STS from EOB, recliner without assistance/AD    Ambulation/Gait Ambulation/Gait assistance: Min guard Gait Distance (Feet): 20 Feet Assistive device: None Gait Pattern/deviations: Decreased step length - right, Decreased step length - left, Decreased stride length Gait velocity: slightly decreased     General Gait Details: full gait assessment limited by pt c/o dizziness with mobility  Stairs            Wheelchair Mobility    Modified Rankin (Stroke Patients Only)       Balance Overall balance assessment: Needs assistance Sitting-balance support: Feet supported Sitting balance-Leahy Scale: Good     Standing balance support: During functional activity, No upper extremity supported Standing balance-Leahy Scale: Fair                               Pertinent Vitals/Pain Pain Assessment Pain Assessment: No/denies pain  Home Living Family/patient expects to be discharged to:: Private residence Living Arrangements: Alone Available Help at Discharge: Family;Friend(s);Available PRN/intermittently (son lives in [place]) Type of Home: House Home Access: Stairs to enter Entrance Stairs-Rails:  (possibly L rail, pt unable to confirm) Entrance Stairs-Number of Steps: "a few" Alternate Level Stairs-Number of Steps:  flight Home Layout: Two level;Bed/bath upstairs        Prior Function Prior Level of Function : Independent/Modified Independent;Driving             Mobility Comments: Independent without AD ADLs Comments: pt reports she does all her cooking, cleaning without assistance.     Hand Dominance        Extremity/Trunk Assessment   Upper Extremity Assessment Upper Extremity Assessment: Overall WFL for tasks assessed    Lower Extremity Assessment Lower Extremity Assessment: Overall WFL for tasks assessed;Generalized weakness       Communication   Communication: No difficulties  Cognition Arousal/Alertness: Awake/alert Behavior During Therapy: WFL for tasks assessed/performed Overall Cognitive Status: Impaired/Different from baseline Area of Impairment: Orientation, Attention, Following commands, Safety/judgement, Awareness, Memory, Problem solving                 Orientation Level: Disoriented to, Place, Situation (oriented to "hospital" but unable to state which hospital, not aware she's in [place] (thought she was in [place]))   Memory: Decreased recall of precautions, Decreased short-term memory Following Commands: Follows one step commands consistently, Follows one step commands with increased time Safety/Judgement: Decreased awareness of deficits, Decreased awareness of safety Awareness: Intellectual Problem Solving: Requires verbal cues, Slow processing General Comments: Pt unaware of deficits, unsure why she's in the hospital        General Comments General comments (skin integrity, edema, etc.): Pt with meal tray beside bed but pt reports she was unaware she had breakfast. PT assisted pt with meal tray set up & calling in new tray with items pt preferred.    Exercises     Assessment/Plan    PT Assessment Patient needs continued PT services  PT Problem List Decreased strength;Decreased activity tolerance;Decreased cognition;Decreased  balance;Decreased mobility;Decreased safety awareness       PT Treatment Interventions Therapeutic exercise;Gait training;Balance training;DME instruction;Stair training;Neuromuscular re-education;Cognitive remediation;Therapeutic activities;Patient/family education;Functional mobility training    PT Goals (Current goals can be found in the Care Plan section)  Acute Rehab PT Goals Patient Stated Goal: get better, return home PT Goal Formulation: With patient Time For Goal Achievement: 01/06/23 Potential to Achieve Goals: Good    Frequency Min 3X/week     Co-evaluation               AM-PAC PT "6 Clicks" Mobility  Outcome Measure Help needed turning from your back to your side while in a flat bed without using bedrails?: None Help needed moving from lying on your back to sitting on the side of a flat bed without using bedrails?: A Little Help needed moving to and from a bed to a chair (including a wheelchair)?: A Little Help needed standing up from a chair using your arms (e.g., wheelchair or bedside chair)?: A Little Help needed to walk in hospital room?: A Little Help needed climbing 3-5 steps with a railing? : A Little 6 Click Score: 19    End of Session Equipment Utilized During Treatment: Gait belt Activity Tolerance: Treatment limited secondary to medical complications (Comment) (limited 2/2 c/o dizziness, low BP with standing) Patient left: in chair;with chair alarm set;with call bell/phone within reach   PT  Visit Diagnosis: Unsteadiness on feet (R26.81);Muscle weakness (generalized) (M62.81)    Time: 1610-9604 PT Time Calculation (min) (ACUTE ONLY): 23 min   Charges:   PT Evaluation $PT Eval Low Complexity: 1 Low          Aleda Grana, PT, DPT 12/23/22, 9:53 AM   Sandi Mariscal 12/23/2022, 9:49 AM

## 2022-12-23 NOTE — Progress Notes (Signed)
Re: Kimberly Frye DOB:1933/07/07 Date:12/23/2022   To Whom It May Concern:  Please be advised that the above-named patient will require a short-term nursing home stay--anticipated 30 days or less for rehabilitation and strengthening. The plan is for home.

## 2022-12-24 ENCOUNTER — Observation Stay (HOSPITAL_COMMUNITY): Payer: Medicare Other

## 2022-12-24 DIAGNOSIS — R4182 Altered mental status, unspecified: Secondary | ICD-10-CM

## 2022-12-24 DIAGNOSIS — R569 Unspecified convulsions: Secondary | ICD-10-CM

## 2022-12-24 DIAGNOSIS — G9340 Encephalopathy, unspecified: Secondary | ICD-10-CM | POA: Diagnosis not present

## 2022-12-24 DIAGNOSIS — G319 Degenerative disease of nervous system, unspecified: Secondary | ICD-10-CM | POA: Diagnosis not present

## 2022-12-24 LAB — ACTH STIMULATION, 3 TIME POINTS
Cortisol, 30 Min: 25.3 ug/dL
Cortisol, 60 Min: 31.6 ug/dL
Cortisol, Base: 15.9 ug/dL

## 2022-12-24 LAB — GLUCOSE, CAPILLARY
Glucose-Capillary: 104 mg/dL — ABNORMAL HIGH (ref 70–99)
Glucose-Capillary: 181 mg/dL — ABNORMAL HIGH (ref 70–99)
Glucose-Capillary: 111 mg/dL — ABNORMAL HIGH (ref 70–99)

## 2022-12-24 LAB — CBC
HCT: 34.4 % — ABNORMAL LOW (ref 36.0–46.0)
Hemoglobin: 11.4 g/dL — ABNORMAL LOW (ref 12.0–15.0)
MCH: 30.1 pg (ref 26.0–34.0)
MCHC: 33.1 g/dL (ref 30.0–36.0)
MCV: 90.8 fL (ref 80.0–100.0)
Platelets: 239 10*3/uL (ref 150–400)
RBC: 3.79 MIL/uL — ABNORMAL LOW (ref 3.87–5.11)
RDW: 13.7 % (ref 11.5–15.5)
WBC: 6.3 10*3/uL (ref 4.0–10.5)
nRBC: 0 % (ref 0.0–0.2)

## 2022-12-24 NOTE — TOC Progression Note (Signed)
Transition of Care [facility]) - Progression Note    Patient Details  Name: Kimberly Frye MRN: [id number] Date of Birth: 11-Dec-1932  [facility] CM/SW Contact  Kermit Balo, RN Phone Number: 12/24/2022, 3:46 PM  Clinical Narrative:    Son and daughter in law have the SNF offers. They also asked for [facility] in [place] and [place] to review. CM has faxed a referral to [facility]. CM left Voicemail at [facility]. Referral also sent to [facility] per their request.  TOC following.   Expected Discharge Plan: Skilled Nursing Facility Barriers to Discharge: Continued Medical Work up  Expected Discharge Plan and Services   Discharge Planning Services: CM Consult Post Acute Care Choice: Skilled Nursing Facility Living arrangements for the past 2 months: Apartment                                       Social Determinants of Health (SDOH) Interventions SDOH Screenings   Food Insecurity: No Food Insecurity (12/22/2022)  Housing: Low Risk  (12/22/2022)  Transportation Needs: No Transportation Needs (12/22/2022)  Utilities: Not At Risk (12/22/2022)  Tobacco Use: Low Risk  (12/22/2022)    Readmission Risk Interventions     No data to display

## 2022-12-24 NOTE — Evaluation (Signed)
Clinical/Bedside Swallow Evaluation Patient Details  Name: DIAMOND JENTZ MRN: [id number] Date of Birth: 1933-05-13  Today's Date: 12/24/2022 Time: SLP Start Time (ACUTE ONLY): 1030 SLP Stop Time (ACUTE ONLY): 1053 SLP Time Calculation (min) (ACUTE ONLY): 23 min  Past Medical History:  Past Medical History:  Diagnosis Date   Anxiety    Hypothyroidism    Past Surgical History: History reviewed. No pertinent surgical history. HPI:  Pt is an 89 y/o F admitted on 12/22/22. Pt is being treated for encephalopathy (toxic vs metabolic) & dehydration. PMH: hypothyroidism, anxiety, HTN    Assessment / Plan / Recommendation  Clinical Impression  Pt participated in clinical swallowing evaluation. She demonstrated active and thorough mastication, the appearance of a brisk swallow response, and no s/s of aspiration.  No dysphagia identified. She remains confused regarding her admission and doesn't understand why she is hospitalized, concerned that she has been stripped of autonomy. She asked repetitive questions and described inconsistent scenarios about the day of her admission. Offered support and encouragement and reviewed her dx with her.  If cognition doesn't clear, she will need frequent supervision at time of D/C. No SLP f/u is needed. SLP Visit Diagnosis: Dysphagia, unspecified (R13.10)    Aspiration Risk  No limitations    Diet Recommendation   Regular solids, thin liquids  Medication Administration: Whole meds with liquid    Other  Recommendations Oral Care Recommendations: Oral care BID    Recommendations for follow up therapy are one component of a multi-disciplinary discharge planning process, led by the attending physician.  Recommendations may be updated based on patient status, additional functional criteria and insurance authorization.  Follow up Recommendations No SLP follow up      Assistance Recommended at Discharge  Frequent supervision    Swallow Study   General  Date of Onset: 12/21/22 HPI: Pt is an 89 y/o F admitted on 12/22/22. Pt is being treated for encephalopathy (toxic vs metabolic) & dehydration. PMH: hypothyroidism, anxiety, HTN Type of Study: Bedside Swallow Evaluation Previous Swallow Assessment: no Diet Prior to this Study: Regular;Thin liquids (Level 0) Temperature Spikes Noted: No Respiratory Status: Room air History of Recent Intubation: No Behavior/Cognition: Alert;Cooperative;Confused Oral Cavity Assessment: Within Functional Limits Oral Care Completed by SLP: No Oral Cavity - Dentition: Adequate natural dentition Vision: Functional for self-feeding Self-Feeding Abilities: Able to feed self Patient Positioning: Upright in bed Baseline Vocal Quality: Normal Volitional Cough: Strong Volitional Swallow: Able to elicit    Oral/Motor/Sensory Function Overall Oral Motor/Sensory Function: Within functional limits   Ice Chips Ice chips: Within functional limits   Thin Liquid Thin Liquid: Within functional limits    Nectar Thick Nectar Thick Liquid: Not tested   Honey Thick Honey Thick Liquid: Not tested   Puree Puree: Within functional limits   Solid     Solid: Within functional limits      Blenda Mounts Laurice 12/24/2022,10:53 AM  Marchelle Folks L. Samson Frederic, MA CCC/SLP Clinical Specialist - Acute Care SLP Acute Rehabilitation Services Office number [phone]

## 2022-12-24 NOTE — Evaluation (Signed)
Speech Language Pathology Evaluation Patient Details Name: Kimberly Frye MRN: [id number] DOB: 1933-02-05 Today's Date: 12/24/2022 Time: 7829-5621 SLP Time Calculation (min) (ACUTE ONLY): 40 min  Problem List:  Patient Active Problem List   Diagnosis Date Noted   AKI (acute kidney injury) (HCC) 12/23/2022   Orthostatic hypotension 12/23/2022   AMS (altered mental status) 12/22/2022   Encephalopathy 12/22/2022   Abdominal aortic atherosclerosis (HCC) 10/21/2020   Adjustment disorder 10/21/2020   Allergic rhinitis 10/21/2020   Chronic kidney disease due to hypertension 10/21/2020   Essential hypertension 10/21/2020   History of malignant neoplasm of skin 10/21/2020   Hyperlipidemia 10/21/2020   Hypothyroidism 10/21/2020   Menopausal flushing 10/21/2020   Mild anxiety 10/21/2020   Mixed anxiety and depressive disorder 10/21/2020   Pure hypercholesterolemia 10/21/2020   Tobacco user 10/21/2020   Vitamin D deficiency 10/21/2020   Past Medical History:  Past Medical History:  Diagnosis Date   Anxiety    Hypothyroidism    Past Surgical History: History reviewed. No pertinent surgical history. HPI:  Pt is an 89 y/o F admitted on 12/22/22. Pt is being treated for encephalopathy (toxic vs metabolic) & dehydration. PMH: hypothyroidism, anxiety, HTN   Assessment / Plan / Recommendation Clinical Impression  Pt participated in cognitive-linguistic assessment. As cognitive screen progressed it became clear that she was exhibiting signs of aphasia, inhibiting assessment of verbal memory and problem solving. Aphasia evaluation revealed deficits in both comprehension and expression - output is fluent but marked by high use of fillers and less frequent use of content words that carry meaning.  There were phonemic and semantic paraphasias, both during conversation and traditional naming tasks (e.g., "bush paste" for toothpaste and "nun" for thumb.)  She had difficulty constructing  sentences  describing the task of cooking scrambled eggs.  ("Go into the oven and make sure you get them cooked right.") Word repetition was intact; sentence repetition impaired.  She followed one and two step commands but had difficulty with multi-step commands. She was able to discriminate among body parts and objects in room but achieved 50% accuracy with left/right discrimination tasks. After assessment, we talked about her dx of aphasia. She stated that over the last few months she had been "trying hard to keep everything straight."  Her MRI is pending. After assessment, she needed assist to use the bathroom; she had difficulty locating toilet once in the bathroom and  required cues to locate the sink/soap.  Recommend consideration of more intensive f/u therapies (>three hours per day) if possible.  She will need f/u aphasia tx in the next venue of care.    SLP Assessment  SLP Recommendation/Assessment: Patient needs continued Speech Lanaguage Pathology Services SLP Visit Diagnosis: Aphasia (R47.01)    Recommendations for follow up therapy are one component of a multi-disciplinary discharge planning process, led by the attending physician.  Recommendations may be updated based on patient status, additional functional criteria and insurance authorization.    Follow Up Recommendations       Assistance Recommended at Discharge  Frequent or constant Supervision/Assistance  Functional Status Assessment Patient has had a recent decline in their functional status and demonstrates the ability to make significant improvements in function in a reasonable and predictable amount of time.  Frequency and Duration min 2x/week  1 week      SLP Evaluation Cognition  Overall Cognitive Status: Impaired/Different from baseline Arousal/Alertness: Awake/alert Orientation Level: Oriented to person;Oriented to place;Disoriented to time;Disoriented to situation Attention: Sustained Sustained Attention: Appears intact  Comprehension  Auditory Comprehension Overall Auditory Comprehension: Impaired Yes/No Questions: Impaired Complex Questions: 25-49% accurate Commands: Impaired Multistep Basic Commands: 25-49% accurate Visual Recognition/Discrimination Discrimination: Exceptions to [facility] L/R Discrimination:  (impaired body part id) Reading Comprehension Reading Status: Not tested    Expression Expression Primary Mode of Expression: Verbal Verbal Expression Overall Verbal Expression: Impaired Initiation: No impairment Level of Generative/Spontaneous Verbalization: Conversation Repetition: Impaired Level of Impairment: Sentence level Naming: Impairment Responsive:  (2/5) Confrontation: Impaired Divergent:  (unable to name any animals during category  naming task) Verbal Errors: Semantic paraphasias;Phonemic paraphasias Pragmatics: No impairment Written Expression Dominant Hand: Right   Oral / Motor  Oral Motor/Sensory Function Overall Oral Motor/Sensory Function: Within functional limits Motor Speech Overall Motor Speech: Appears within functional limits for tasks assessed            Blenda Mounts Laurice 12/24/2022, 4:32 PM Marchelle Folks L. Samson Frederic, MA CCC/SLP Clinical Specialist - Acute Care SLP Acute Rehabilitation Services Office number [phone]

## 2022-12-24 NOTE — Progress Notes (Signed)
Phlebotomy drew labs for ACTH stimulation. Cortrosyn administered. Pt stated she was nauseous after medication, refused antiemetic. Refused thyroid medication. Phlebotomy to return in 30 minutes for lab draw.

## 2022-12-24 NOTE — Plan of Care (Signed)
  Problem: Activity: Goal: Risk for activity intolerance will decrease Outcome: Progressing   Problem: Nutrition: Goal: Adequate nutrition will be maintained Outcome: Progressing   Problem: Coping: Goal: Level of anxiety will decrease Outcome: Progressing   Problem: Pain Managment: Goal: General experience of comfort will improve Outcome: Progressing   Problem: Safety: Goal: Ability to remain free from injury will improve Outcome: Progressing   

## 2022-12-24 NOTE — Procedures (Signed)
Patient Name: Kimberly Frye  MRN: [id number]  Epilepsy Attending: Charlsie Quest  Referring Physician/Provider: Marjorie Smolder, NP  Date: 12/24/2022 Duration: 23.10 mins  Patient history: 89yo F with ams getting eeg to evaluate for seizure  Level of alertness: Awake  AEDs during EEG study: None  Technical aspects: This EEG study was done with scalp electrodes positioned according to the 10-20 International system of electrode placement. Electrical activity was reviewed with band pass filter of 1-70Hz , sensitivity of 7 uV/mm, display speed of 36mm/sec with a 60Hz  notched filter applied as appropriate. EEG data were recorded continuously and digitally stored.  Video monitoring was available and reviewed as appropriate.  Description: The posterior dominant rhythm consists of 8 Hz activity of moderate voltage (25-35 uV) seen predominantly in posterior head regions, symmetric and reactive to eye opening and eye closing. EEG showed intermittent generalized 3 to 6 Hz theta-delta slowing. Hyperventilation and photic stimulation were not performed.     ABNORMALITY - Intermittent slow, generalized  IMPRESSION: This study is suggestive of mild diffuse encephalopathy, nonspecific etiology. No seizures or epileptiform discharges were seen throughout the recording.  Tichina Koebel Annabelle Harman

## 2022-12-24 NOTE — Consult Note (Signed)
Neurology Consultation  Reason for Consult: Altered mental status with possible hallucinations Referring Physician: Dr. Myriam Forehand  CC: None  History is obtained from: Patient, family and chart  HPI: Kimberly Frye is a 89 y.o. female with history of hypothyroidism and depression who presents after behaving erratically on Monday.  Patient and family states the patient is normally independent, lives alone, drives and manages her own affairs.  Patient's family states that they noticed a difference in her behavior on Monday.  Patient had reportedly gone to a restaurant to meet a friend and was observed by that friend to be switching between seats in her car and parking her car erratically in the parking lot.  She then went into the restaurant and said that she was going to meet someone who was not there.  Her friend stayed with her, and called family to let them know she was acting erratically.  Patient remembers this interaction and states that she had gone to the restaurant to meet another friend and had sat in the car waiting for him but he did not show up.  Patient's family states that her friends told him that patient had appeared nonspecifically altered.  Patient does not recall seeing or hearing things that others do not perceive at that time.  Patient's family came to pick her up from the restaurant and drove by her house and found the doors open and the house unlocked.  They asked patient about this, and she stated that some people had come to her house to help her with an alarm but was unable to further state what it happened.  On exam, patient is alert and oriented x 4 and able to discuss events of recent few months.  Patient's family states that she was given some sort of dementia test by her primary care provider in January, but the results of this are not available.  She denies visual or auditory hallucinations and verbalizes concerns about losing her independence.    ROS: A complete ROS was  performed and is negative except as noted in the HPI.   Past Medical History:  Diagnosis Date   Anxiety    Hypothyroidism      No family history on file.   Social History:   reports that she has never smoked. She has never been exposed to tobacco smoke. She has never used smokeless tobacco. She reports current alcohol use. She reports that she does not use drugs.  Medications  Current Facility-Administered Medications:    acetaminophen (TYLENOL) tablet 650 mg, 650 mg, Oral, Q6H PRN, 650 mg at 12/23/22 0981 **OR** acetaminophen (TYLENOL) suppository 650 mg, 650 mg, Rectal, Q6H PRN, Debe Coder B, MD   amLODipine (NORVASC) tablet 2.5 mg, 2.5 mg, Oral, Daily, Debe Coder B, MD, 2.5 mg at 12/24/22 1004   diclofenac Sodium (VOLTAREN) 1 % topical gel 4 g, 4 g, Topical, QID PRN, Debe Coder B, MD   enoxaparin (LOVENOX) injection 30 mg, 30 mg, Subcutaneous, Q24H, Debe Coder B, MD, 30 mg at 12/22/22 1640   levothyroxine (SYNTHROID) tablet 75 mcg, 75 mcg, Oral, Q0600, Debe Coder B, MD, 75 mcg at 12/23/22 0628   sodium chloride flush (NS) 0.9 % injection 3 mL, 3 mL, Intravenous, Q12H, Debe Coder B, MD, 3 mL at 12/24/22 1004   Exam: Current vital signs: BP (!) 171/69 (BP Location: Right Arm)   Pulse 72   Temp 98 F (36.7 C) (Oral)   Resp 18   Ht 5\' 3"  (1.6 m)  Wt 45.4 kg   SpO2 98%   BMI 17.71 kg/m  Vital signs in last 24 hours: Temp:  [98 F (36.7 C)-98.3 F (36.8 C)] 98 F (36.7 C) (06/13 0802) Pulse Rate:  [68-75] 72 (06/13 0802) Resp:  [12-18] 18 (06/13 0802) BP: (157-177)/(67-77) 171/69 (06/13 0802) SpO2:  [97 %-100 %] 98 % (06/13 0802)  GENERAL: Awake, alert, in no acute distress Psych: Affect appropriate for situation, patient is calm and cooperative with examination Head: Normocephalic and atraumatic, without obvious abnormality EENT: Normal conjunctivae, moist mucous membranes, no OP obstruction LUNGS: Normal respiratory effort. Non-labored breathing  on room air CV: Regular rate and rhythm on telemetry Extremities: warm, well perfused, without obvious deformity  NEURO:  Mental Status: Awake, alert, and oriented to person, place, time, and situation. She is able to provide some history of present illness.  3 out of 3 registration, 2 out of 3 recall, able to name only 2 animals with 4 feet when asked Speech/Language: speech is clear and fluent but somewhat tangential.   No neglect is noted Cranial Nerves:  II: PERRL III, IV, VI: EOMI. Lid elevation symmetric and full.  V: Sensation is intact to light touch and symmetrical to face.  VII: Face is symmetric resting and smiling.   VIII: Hearing intact to voice IX, X: Phonation normal.  XI: Normal sternocleidomastoid and trapezius muscle strength XII: Tongue protrudes midline without fasciculations.   Motor: 5/5 strength is all muscle groups.  Tone is normal. Bulk is normal.  Sensation: Intact to light touch bilaterally in all four extremities.  Coordination: FTN intact bilaterally some difficulty following two-step command to accomplish this task, patient takes examiner's finger and touches it to her nose.  No pronator drift.  Gait: Deferred   Labs I have reviewed labs in epic and the results pertinent to this consultation are:   CBC    Component Value Date/Time   WBC 6.3 12/24/2022 0540   RBC 3.79 (L) 12/24/2022 0540   HGB 11.4 (L) 12/24/2022 0540   HCT 34.4 (L) 12/24/2022 0540   PLT 239 12/24/2022 0540   MCV 90.8 12/24/2022 0540   MCH 30.1 12/24/2022 0540   MCHC 33.1 12/24/2022 0540   RDW 13.7 12/24/2022 0540    CMP     Component Value Date/Time   NA 137 12/23/2022 0240   K 3.8 12/23/2022 0240   CL 107 12/23/2022 0240   CO2 24 12/23/2022 0240   GLUCOSE 87 12/23/2022 0240   BUN 20 12/23/2022 0240   CREATININE 1.11 (H) 12/23/2022 0240   CALCIUM 9.2 12/23/2022 0240   PROT 7.3 12/21/2022 2249   ALBUMIN 4.5 12/21/2022 2249   AST 19 12/21/2022 2249   ALT 9  12/21/2022 2249   ALKPHOS 83 12/21/2022 2249   BILITOT 0.4 12/21/2022 2249   GFRNONAA 48 (L) 12/23/2022 0240    Lipid Panel  No results found for: "CHOL", "TRIG", "HDL", "CHOLHDL", "VLDL", "LDLCALC", "LDLDIRECT"   Imaging I have reviewed the images obtained:  CT-scan of the brain: No acute abnormality, chronic small vessel disease and atrophy  Assessment: 89 year old patient with history of hypothyroidism and depression presents with an episode of altered behavior on Monday.  According to patient and family, she lives alone and is normally able to drive and manage her own affairs without issue.  On Monday, she went to a restaurant to meet a friend and was observed to have parked her car erratically and to be shifting between seats in her car in the  parking lot.  Her friend stated that she seemed nonspecifically altered and called her family to come and pick her up.  Patient has no recall of this event and states that she had come to the restaurant to meet someone who did not show up and simply been waiting in the parking lot for them.  On exam, patient has good recall of recent events as well as events a few months past.  She states that she is able to manage her own affairs quite well and is concerned about losing her independence.  She denies hallucinations does not seem to be responding to internal stimuli.  She is alert and oriented x 4 with 3 out of 3 registration, 2 out of 3 recall but is only able to name 2 animals with 4 feet when asked.  It is possible that she has some mild cognitive impairment, but reason for recent worsening is puzzling.  Impression: Episode of erratic behavior and elderly patient with no known dementia, uncertain etiology at this time, seizure activity and structural lesion on the differential  Recommendations: -Cognitive evaluation by SLP -EEG -Brain MRI -Folate, thiamine and RPR in the morning   Pt seen by NP/Neuro and later by MD. Note/plan to be edited by  MD as needed.  Cortney E Ernestina Columbia , MSN, AGACNP-BC [facility] See Amion for schedule and pager information 12/24/2022 12:05 PM  ATTENDING ATTESTATION:  AMS now back to baseline per son.  Pt feels great. She is alert and oriented to person, place and year. Follows all commands.   Discussed normal MRI and EEG.   Neurology will sign off. Call with questions.   Dr. Viviann Spare evaluated pt independently, reviewed imaging, chart, labs. Discussed and formulated plan with the Resident/APP. Changes were made to the note where appropriate. Please see APP/resident note above for details.   Merial Moritz,MD

## 2022-12-24 NOTE — Progress Notes (Signed)
EEG complete - results pending 

## 2022-12-24 NOTE — Progress Notes (Signed)
Progress Note    Kimberly Frye  ZOX:[id number] DOB: 1933-06-16  DOA: 12/22/2022 PCP: Fatima Sanger, FNP      Brief Narrative:    Medical records reviewed and are as summarized below:  Kimberly Frye is a 89 y.o. female with medical history significant for anxiety, depression, CKD stage III, hypertension, hypothyroidism, who was brought to the hospital because of confusion and hallucinations.  Family reported seeing her last Thursday and she was in her normal state of health. They even observed her driving, however, a friend of [name]'s called them on Monday and noted that [name] was acting odd at [employer]. Patient and friends eat together at this restaurant every Monday and the staff know her well. The friend reports that Ms. Gillham took a while to get out of the car and when she got in [employer] noted that she was "there to see a man." She thought this man was the friend's husband. Her friend called the family and then 911. The patients nephew checked on her and noted that she was fine, just with some balance and vision issues.  Family also reported that patient has been hearing voices in her house and they think she may be hearing the TV       Assessment/Plan:   Principal Problem:   Encephalopathy Active Problems:   Chronic kidney disease due to hypertension   Hypothyroidism   Mixed anxiety and depressive disorder   AKI (acute kidney injury) (HCC)   Orthostatic hypotension    Body mass index is 17.71 kg/m.   Probable acute metabolic encephalopathy: Improving. Vitamin B12 level was normal. Consulted neurologist to assist with management.  MRI brain has been ordered for further evaluation.    AKI on CKD stage IIIb, dehydration: Improved with IV fluids.  Creatinine down from 1.57-1.11.  Family said her creatinine was 1.19 and GFR 41 in January 2024 and creatinine 1.29 and GFR 37 in June 2023.   Orthostatic hypotension: 12/22/2022, lying BP  was 181/67, standing BP at 0 minutes 133/78, standing BP at 3 minutes was 138/61.  Repeat orthostatic vital signs on 12/23/2022: BP 130/57, standing BP at 0 minutes 122/57, standing BP at 3 minutes 106/60. Repeat orthostatic vital signs. Cortisol level 14.5.  ACTH stimulation test was normal.   Hypothyroidism: TSH 2.1.  Continue Synthroid   Hypertension: BP is better.  Continue amlodipine   General weakness: PT and OT recommended discharge to SNF.   Family had concerns about swallowing which has been a problem for some time now.  She was evaluated by speech therapist and no issues were found.   Other comorbidities include anxiety and depression     Diet Order             Diet regular Room service appropriate? No; Fluid consistency: Thin  Diet effective now                            Consultants: None  Procedures: None    Medications:    amLODipine  2.5 mg Oral Daily   enoxaparin (LOVENOX) injection  30 mg Subcutaneous Q24H   levothyroxine  75 mcg Oral Q0600   sodium chloride flush  3 mL Intravenous Q12H   Continuous Infusions:   Anti-infectives (From admission, onward)    None              Family Communication/Anticipated D/C date and plan/Code Status  DVT prophylaxis: enoxaparin (LOVENOX) injection 30 mg Start: 12/22/22 1430     Code Status: Full Code  Family Communication: None Disposition Plan: Plan to discharge home in 1 to 2 days   Status is: Observation The patient will require care spanning > 2 midnights and should be moved to inpatient because: Confusion, work up in progress       Subjective:   Interval events noted.  She has no complaints.  She said she feels better prefers to go home.   Objective:    Vitals:   12/24/22 0455 12/24/22 0802 12/24/22 1213 12/24/22 1531  BP: (!) 157/77 (!) 171/69 131/61 (!) 126/56  Pulse: 68 72 83 77  Resp: 16 18 18 16   Temp: 98 F (36.7 C) 98 F (36.7 C) 98 F (36.7 C)  98.2 F (36.8 C)  TempSrc: Axillary Oral Oral Oral  SpO2: 100% 98% 99% 100%  Weight:      Height:       No data found.    Intake/Output Summary (Last 24 hours) at 12/24/2022 1726 Last data filed at 12/24/2022 1154 Gross per 24 hour  Intake 0 ml  Output 1275 ml  Net -1275 ml   Filed Weights   12/22/22 1056  Weight: 45.4 kg    Exam:  GEN: NAD SKIN: Warm and dry EYES: EOMI ENT: MMM CV: RRR PULM: CTA B ABD: soft, ND, NT, +BS CNS: AAO x 3, non focal EXT: No edema or tenderness      Data Reviewed:   I have personally reviewed following labs and imaging studies:  Labs: Labs show the following:   Basic Metabolic Panel: Recent Labs  Lab 12/21/22 2249 12/22/22 1411 12/23/22 0240  NA 138  --  137  K 4.3  --  3.8  CL 102  --  107  CO2 27  --  24  GLUCOSE 119*  --  87  BUN 35*  --  20  CREATININE 1.57* 1.12* 1.11*  CALCIUM 10.0  --  9.2   GFR Estimated Creatinine Clearance: 25.6 mL/min (A) (by C-G formula based on SCr of 1.11 mg/dL (H)). Liver Function Tests: Recent Labs  Lab 12/21/22 2249  AST 19  ALT 9  ALKPHOS 83  BILITOT 0.4  PROT 7.3  ALBUMIN 4.5   No results for input(s): "LIPASE", "AMYLASE" in the last 168 hours. No results for input(s): "AMMONIA" in the last 168 hours. Coagulation profile No results for input(s): "INR", "PROTIME" in the last 168 hours.  CBC: Recent Labs  Lab 12/21/22 2249 12/22/22 1411 12/23/22 0240 12/24/22 0540  WBC 6.8 7.3 6.1 6.3  HGB 11.4* 12.5 10.7* 11.4*  HCT 33.6* 37.2 32.6* 34.4*  MCV 89.8 90.7 89.6 90.8  PLT 233 230 231 239   Cardiac Enzymes: No results for input(s): "CKTOTAL", "CKMB", "CKMBINDEX", "TROPONINI" in the last 168 hours. BNP (last 3 results) No results for input(s): "PROBNP" in the last 8760 hours. CBG: Recent Labs  Lab 12/21/22 2253 12/24/22 0805 12/24/22 1214 12/24/22 1623  GLUCAP 116* 104* 181* 111*   D-Dimer: No results for input(s): "DDIMER" in the last 72 hours. Hgb A1c: No  results for input(s): "HGBA1C" in the last 72 hours. Lipid Profile: No results for input(s): "CHOL", "HDL", "LDLCALC", "TRIG", "CHOLHDL", "LDLDIRECT" in the last 72 hours. Thyroid function studies: Recent Labs    12/22/22 1411  TSH 2.195   Anemia work up: Recent Labs    12/22/22 1725 12/22/22 1734  VITAMINB12  --  512  FERRITIN  36  --   TIBC 361  --   IRON 67  --    Sepsis Labs: Recent Labs  Lab 12/21/22 2249 12/22/22 1411 12/23/22 0240 12/24/22 0540  WBC 6.8 7.3 6.1 6.3    Microbiology No results found for this or any previous visit (from the past 240 hour(s)).  Procedures and diagnostic studies:  EEG adult  Result Date: 12/24/2022 Charlsie Quest, MD     12/24/2022  5:13 PM Patient Name: KIMMBERLY LANGMAN MRN: [id number] Epilepsy Attending: Charlsie Quest Referring Physician/Provider: Marjorie Smolder, NP Date: 12/24/2022 Duration: 23.10 mins Patient history: 89yo F with ams getting eeg to evaluate for seizure Level of alertness: Awake AEDs during EEG study: None Technical aspects: This EEG study was done with scalp electrodes positioned according to the 10-20 International system of electrode placement. Electrical activity was reviewed with band pass filter of 1-70Hz , sensitivity of 7 uV/mm, display speed of 5mm/sec with a 60Hz  notched filter applied as appropriate. EEG data were recorded continuously and digitally stored.  Video monitoring was available and reviewed as appropriate. Description: The posterior dominant rhythm consists of 8 Hz activity of moderate voltage (25-35 uV) seen predominantly in posterior head regions, symmetric and reactive to eye opening and eye closing. EEG showed intermittent generalized 3 to 6 Hz theta-delta slowing. Hyperventilation and photic stimulation were not performed.   ABNORMALITY - Intermittent slow, generalized IMPRESSION: This study is suggestive of mild diffuse encephalopathy, nonspecific etiology. No seizures or epileptiform  discharges were seen throughout the recording. Priyanka O Yadav               LOS: 0 days   Kemberly Taves  [facility]   Pager on [email or web address]. If 7PM-7AM, please contact night-coverage at [email or web address]     12/24/2022, 5:26 PM

## 2022-12-24 NOTE — Plan of Care (Signed)

## 2022-12-25 DIAGNOSIS — G9340 Encephalopathy, unspecified: Secondary | ICD-10-CM | POA: Diagnosis not present

## 2022-12-25 DIAGNOSIS — Z9181 History of falling: Secondary | ICD-10-CM | POA: Diagnosis not present

## 2022-12-25 DIAGNOSIS — N1832 Chronic kidney disease, stage 3b: Secondary | ICD-10-CM | POA: Diagnosis not present

## 2022-12-25 DIAGNOSIS — F323 Major depressive disorder, single episode, severe with psychotic features: Secondary | ICD-10-CM | POA: Diagnosis not present

## 2022-12-25 DIAGNOSIS — I700 Atherosclerosis of aorta: Secondary | ICD-10-CM | POA: Diagnosis not present

## 2022-12-25 DIAGNOSIS — M6281 Muscle weakness (generalized): Secondary | ICD-10-CM | POA: Diagnosis not present

## 2022-12-25 DIAGNOSIS — R4189 Other symptoms and signs involving cognitive functions and awareness: Secondary | ICD-10-CM | POA: Diagnosis not present

## 2022-12-25 DIAGNOSIS — E039 Hypothyroidism, unspecified: Secondary | ICD-10-CM | POA: Diagnosis not present

## 2022-12-25 DIAGNOSIS — R1310 Dysphagia, unspecified: Secondary | ICD-10-CM | POA: Diagnosis not present

## 2022-12-25 DIAGNOSIS — G319 Degenerative disease of nervous system, unspecified: Secondary | ICD-10-CM | POA: Diagnosis not present

## 2022-12-25 DIAGNOSIS — I129 Hypertensive chronic kidney disease with stage 1 through stage 4 chronic kidney disease, or unspecified chronic kidney disease: Secondary | ICD-10-CM | POA: Diagnosis not present

## 2022-12-25 DIAGNOSIS — G3184 Mild cognitive impairment, so stated: Secondary | ICD-10-CM | POA: Diagnosis not present

## 2022-12-25 DIAGNOSIS — E440 Moderate protein-calorie malnutrition: Secondary | ICD-10-CM | POA: Diagnosis not present

## 2022-12-25 DIAGNOSIS — R2681 Unsteadiness on feet: Secondary | ICD-10-CM | POA: Diagnosis not present

## 2022-12-25 DIAGNOSIS — F418 Other specified anxiety disorders: Secondary | ICD-10-CM | POA: Diagnosis not present

## 2022-12-25 DIAGNOSIS — R41841 Cognitive communication deficit: Secondary | ICD-10-CM | POA: Diagnosis not present

## 2022-12-25 DIAGNOSIS — N179 Acute kidney failure, unspecified: Secondary | ICD-10-CM | POA: Diagnosis not present

## 2022-12-25 DIAGNOSIS — F419 Anxiety disorder, unspecified: Secondary | ICD-10-CM | POA: Diagnosis not present

## 2022-12-25 DIAGNOSIS — I10 Essential (primary) hypertension: Secondary | ICD-10-CM | POA: Diagnosis not present

## 2022-12-25 DIAGNOSIS — R5381 Other malaise: Secondary | ICD-10-CM | POA: Diagnosis not present

## 2022-12-25 LAB — FOLATE: Folate: 12.8 ng/mL (ref >5.9)

## 2022-12-25 LAB — GLUCOSE, CAPILLARY: Glucose-Capillary: 104 mg/dL — ABNORMAL HIGH (ref 70–99)

## 2022-12-25 LAB — RPR: RPR Ser Ql: NONREACTIVE

## 2022-12-25 MED ORDER — ORAL CARE MOUTH RINSE: 15.0000 mL | OROMUCOSAL | Status: DC | PRN

## 2022-12-25 MED ORDER — AMLODIPINE BESYLATE 2.5 MG PO TABS: 2.5000 mg | ORAL_TABLET | Freq: Every day | ORAL | 0 refills | Status: AC

## 2022-12-25 MED ORDER — DICLOFENAC SODIUM 1 % EX GEL: 4.0000 g | Freq: Four times a day (QID) | CUTANEOUS | 6 refills | Status: DC | PRN

## 2022-12-25 NOTE — Progress Notes (Signed)
Physical Therapy Treatment Patient Details Name: Kimberly Frye MRN: [id number] DOB: 10-22-1932 Today's Date: 12/25/2022   History of Present Illness Pt is an 89 y/o F admitted on 12/22/22. Pt is being treated for encephalopathy (toxic vs metabolic) & dehydration. PMH: hypothyroidism, anxiety, HTN    PT Comments    Pt seen for PT tx with pt agreeable, pleasantly confused throughout session. Pt is able to ambulate increased distances without AD with close supervision increasing to Min assist during occasional LOB. Pt negotiates 10 steps with 1 rail & CGA. Pt completed Berg Balance Test scoring 32/56. Patient demonstrates increased fall risk as noted by score of 32/56 on Berg Balance Scale.  (<36= high risk for falls, close to 100%; 37-45 significant >80%; 46-51 moderate >50%; 52-55 lower >25%). Continue to recommend ongoing PT treatment to address safety with mobility, gait & stair negotiation, & balance training.     Recommendations for follow up therapy are one component of a multi-disciplinary discharge planning process, led by the attending physician.  Recommendations may be updated based on patient status, additional functional criteria and insurance authorization.  Follow Up Recommendations  Can patient physically be transported by private vehicle: Yes    Assistance Recommended at Discharge Frequent or constant Supervision/Assistance  Patient can return home with the following A little help with walking and/or transfers;A little help with bathing/dressing/bathroom;Assistance with cooking/housework;Assist for transportation;Help with stairs or ramp for entrance;Direct supervision/assist for financial management   Equipment Recommendations  None recommended by PT    Recommendations for Other Services       Precautions / Restrictions Precautions Precautions: Fall Restrictions Weight Bearing Restrictions: No     Mobility  Bed Mobility Overal bed mobility: Needs Assistance Bed  Mobility: Supine to Sit     Supine to sit: Modified independent (Device/Increase time)          Transfers Overall transfer level: Needs assistance Equipment used: None Transfers: Sit to/from Stand Sit to Stand: Supervision           General transfer comment: STS from EOB, recliner, toilet    Ambulation/Gait Ambulation/Gait assistance: Supervision, Min guard Gait Distance (Feet): 200 Feet Assistive device: None Gait Pattern/deviations: Scissoring, Trunk flexed Gait velocity: decreased     General Gait Details: Pt with occasional scissoring gait requiring min assist for balance; this worsens when pt is looking L/R to attempt to locate her room.   Stairs Stairs: Yes Stairs assistance: Min guard Stair Management: One rail Left Number of Stairs: 10 (3" + 6")     Wheelchair Mobility    Modified Rankin (Stroke Patients Only)       Balance                                 Standardized Balance Assessment Standardized Balance Assessment : Berg Balance Test Berg Balance Test Sit to Stand: Needs minimal aid to stand or to stabilize (leans posteriorly on seat for support (on EOB and standard chair)) Standing Unsupported: Able to stand 2 minutes with supervision Sitting with Back Unsupported but Feet Supported on Floor or Stool: Able to sit safely and securely 2 minutes Stand to Sit: Sits safely with minimal use of hands Transfers: Able to transfer safely, definite need of hands Standing Unsupported with Eyes Closed: Able to stand 10 seconds with supervision Standing Ubsupported with Feet Together: Able to place feet together independently and stand for 1 minute with supervision From Standing, Reach Forward  with Outstretched Arm: Can reach forward >5 cm safely (2") From Standing Position, Pick up Object from Floor: Able to pick up shoe, needs supervision From Standing Position, Turn to Look Behind Over each Shoulder: Turn sideways only but maintains  balance Turn 360 Degrees: Needs close supervision or verbal cueing Standing Unsupported, Alternately Place Feet on Step/Stool: Needs assistance to keep from falling or unable to try (min assist until LOB after 4th tap requiring max assist to prevent fall) Standing Unsupported, One Foot in Front: Able to plae foot ahead of the other independently and hold 30 seconds Standing on One Leg: Unable to try or needs assist to prevent fall Total Score: 32        Cognition Arousal/Alertness: Awake/alert   Overall Cognitive Status: Impaired/Different from baseline Area of Impairment: Memory, Safety/judgement, Awareness, Attention, Problem solving, Orientation                 Orientation Level: Disoriented to, Situation   Memory: Decreased recall of precautions, Decreased short-term memory Following Commands: Follows one step commands with increased time, Follows multi-step commands inconsistently Safety/Judgement: Decreased awareness of safety, Decreased awareness of deficits Awareness: Intellectual Problem Solving: Slow processing General Comments: Pt unaware of why she's in the hospital. Pleasantly confused throughout session.        Exercises      General Comments General comments (skin integrity, edema, etc.): Pt toileted with continent void, performed peri hygiene in standing with close supervision<>CGA.      Pertinent Vitals/Pain Pain Assessment Pain Assessment: No/denies pain    Home Living                          Prior Function            PT Goals (current goals can now be found in the care plan section) Acute Rehab PT Goals Patient Stated Goal: get better, return home PT Goal Formulation: With patient Time For Goal Achievement: 01/06/23 Potential to Achieve Goals: Good Progress towards PT goals: Progressing toward goals    Frequency    Min 3X/week      PT Plan Current plan remains appropriate    Co-evaluation              AM-PAC  PT "6 Clicks" Mobility   Outcome Measure  Help needed turning from your back to your side while in a flat bed without using bedrails?: None Help needed moving from lying on your back to sitting on the side of a flat bed without using bedrails?: None Help needed moving to and from a bed to a chair (including a wheelchair)?: A Little Help needed standing up from a chair using your arms (e.g., wheelchair or bedside chair)?: A Little Help needed to walk in hospital room?: A Little Help needed climbing 3-5 steps with a railing? : A Little 6 Click Score: 20    End of Session Equipment Utilized During Treatment: Gait belt Activity Tolerance: Patient tolerated treatment well Patient left: in chair;with chair alarm set;with call bell/phone within reach   PT Visit Diagnosis: Unsteadiness on feet (R26.81);Muscle weakness (generalized) (M62.81)     Time: 1610-9604 PT Time Calculation (min) (ACUTE ONLY): 31 min  Charges:  $Therapeutic Activity: 23-37 mins                     Aleda Grana, PT, DPT 12/25/22, 8:42 AM    Sandi Mariscal 12/25/2022, 8:40 AM

## 2022-12-25 NOTE — Progress Notes (Signed)
Speech Language Pathology Treatment: Cognitive-Linquistic  Patient Details Name: Kimberly Frye MRN: [id number] DOB: [birth year] Today's Date: 12/25/2022 Time: 4098-1191 SLP Time Calculation (min) (ACUTE ONLY): 42 min  Assessment / Plan / Recommendation Clinical Impression  Session focused on aphasia tx. Pt pleased to hear results of her MRI indicated no stroke. She demonstrated ongoing dysnomia/word-finding difficulty during conversation and naming tasks.  Poor responsive and divergent naming. Improved left/right discrimination today (80%) accuracy.  Needed max cues to complete simple math equations (subtraction and addition) - showed recognition of difficulty and was upset that these tasks were so difficult for her.  She will need frequent supervision while recovering from this hospitalization.  She will benefit from language therapy to determine if she would derive some benefit.  Recommend OP neurology consult for dementia evaluation.  Spoke with pt's son, Kimberly Frye, and his wife, Kimberly Frye, over the phone after our session - reviewed results of yesterday's assessment and concerns re: Kimberly Frye potential to return to living independently.  They have noted changes in cognition in the last several months and are very supportive.    SLP will follow while admitted.   HPI HPI: Pt is an 89 y/o F admitted on 12/22/22. Pt is being treated for encephalopathy (toxic vs metabolic) & dehydration. PMH: hypothyroidism, anxiety, HTN      SLP Plan  Continue with current plan of care      Recommendations for follow up therapy are one component of a multi-disciplinary discharge planning process, led by the attending physician.  Recommendations may be updated based on patient status, additional functional criteria and insurance authorization.    Recommendations                     Oral care BID   Frequent or constant Supervision/Assistance Aphasia (R47.01)     Continue with current plan of  care    Kimberly Frye L. Samson Frederic, MA CCC/SLP Clinical Specialist - Acute Care SLP Acute Rehabilitation Services Office number [phone]  Kimberly Frye Kimberly Frye  12/25/2022, 10:16 AM

## 2022-12-25 NOTE — TOC Transition Note (Signed)
Transition of Care [facility]) - CM/SW Discharge Note   Patient Details  Name: Kimberly Frye MRN: [id number] Date of Birth: [birth year]  [facility]) CM/SW Contact:  Kermit Balo, RN Phone Number: 12/25/2022, 12:51 PM   Clinical Narrative:    Pt is discharging to [facility]. She will transport via family. Summary sent to facility.   Number for report: [phone]   Final next level of care: Skilled Nursing Facility Barriers to Discharge: No Barriers Identified   Patient Goals and CMS Choice CMS Medicare.gov Compare Post Acute Care list provided to:: Patient Represenative (must comment) Choice offered to / list presented to : Patient, Adult Children  Discharge Placement PASRR number recieved: 12/24/22 PASRR number recieved: 12/24/22            Patient chooses bed at: [facility] Patient to be transferred to facility by: family Name of family member notified: Susan--DIL Patient and family notified of of transfer: 12/25/22  Discharge Plan and Services Additional resources added to the After Visit Summary for     Discharge Planning Services: CM Consult Post Acute Care Choice: Skilled Nursing Facility                               Social Determinants of Health (SDOH) Interventions SDOH Screenings   Food Insecurity: No Food Insecurity (12/22/2022)  Housing: Low Risk  (12/22/2022)  Transportation Needs: No Transportation Needs (12/22/2022)  Utilities: Not At Risk (12/22/2022)  Tobacco Use: Low Risk  (12/22/2022)     Readmission Risk Interventions     No data to display

## 2022-12-25 NOTE — Discharge Summary (Addendum)
Physician Discharge Summary   Patient: Kimberly Frye MRN: [id number] DOB: [birth year]  Admit date:     12/22/2022  Discharge date: 12/25/22  Discharge Physician: Rolm Gala   PCP: Fatima Sanger, FNP   Recommendations at discharge:   Recommend outpatient neurology follow-up for investigation of dementia Pt will benefit from language therapy per SLP for dysnomia/word finding difficulties Recheck a BMP within 1 week On going rehab for general weakness at SNF  Discharge Diagnoses: Principal Problem:   Encephalopathy Active Problems:   Chronic kidney disease due to hypertension   Hypothyroidism   Mixed anxiety and depressive disorder   AKI (acute kidney injury) (HCC)   Orthostatic hypotension  Resolved Problems:   * No resolved hospital problems. *  Hospital Course: Kimberly Frye is a 89 y.o. female with medical history significant for anxiety, depression, CKD stage III, hypertension, hypothyroidism, who was brought to the hospital because of confusion and hallucinations   Assessment and Plan:   Altered mental status  Possible dementia Patient was admitted with erratic behavior, confusion and hallucinations.  Last known normal was 1 week ago according to family.  CT scan: No acute abnormalities, chronic small vessel disease and atrophy.  Neurology was consulted who ordered MRI and EEG which were normal.  RPR and folate normal. Vitamin B1 pending at discharge.  Patient's mental status resolved within 2 days of admission she was back to baseline.  PT recommended SNF but patient is being discharged today.  Recommend neurology outpatient follow-up for investigation of dementia.  AKI on CKD stage IIIb Creatinine 1 1.57 on admission likely secondary to dehydration.  Improved with IV fluids.  Creatinine improved to her baseline of 1.1.  Mood disorder Zoloft held during hospital admission, can restart on discharge  Hypertension Amlodipine was started during the  hospital stay for elevated blood pressures.  Consider stopping if necessary      Consultants: Neurology Procedures performed: EEG Disposition: Skilled nursing facility Diet recommendation:  Discharge Diet Orders (From admission, onward)     Start     Ordered   12/25/22 0000  Diet - low sodium heart healthy        12/25/22 1230           Regular diet DISCHARGE MEDICATION: Allergies as of 12/25/2022       Reactions   Bystolic [nebivolol Hcl] Swelling   Leg edema   Celexa [citalopram Hydrobromide] Other (See Comments)   "In a daze"   Penicillins Other (See Comments)   Unknown reaction        Medication List     TAKE these medications    amLODipine 2.5 MG tablet Commonly known as: NORVASC Take 1 tablet (2.5 mg total) by mouth daily. Start taking on: December 26, 2022   diclofenac Sodium 1 % Gel Commonly known as: Voltaren Apply 4 g topically 4 (four) times daily as needed.   sertraline 25 MG tablet Commonly known as: ZOLOFT Take 25 mg by mouth every evening.   Synthroid 75 MCG tablet Generic drug: levothyroxine Take 75 mcg by mouth daily.   ZyrTEC Allergy 10 MG tablet Generic drug: cetirizine Take 10 mg by mouth daily as needed for allergies.        Discharge Exam: Filed Weights   12/22/22 1056  Weight: 45.4 kg   General: Alert, no acute distress, tearful, interactive Cardio: Normal S1 and S2, RRR, no r/m/g Pulm: CTAB, normal work of breathing Abdomen: Bowel sounds normal. Abdomen soft and non-tender.  Extremities: No peripheral edema.  Neuro: Cranial nerves grossly intact, ANO x 3  Condition at discharge: good  The results of significant diagnostics from this hospitalization (including imaging, microbiology, ancillary and laboratory) are listed below for reference.   Imaging Studies: MR BRAIN WO CONTRAST  Result Date: 12/24/2022 CLINICAL DATA:  Mental status change EXAM: MRI HEAD WITHOUT CONTRAST TECHNIQUE: Multiplanar, multiecho pulse  sequences of the brain and surrounding structures were obtained without intravenous contrast. COMPARISON:  No prior MRI available, correlation is made with CT head 12/22/2022 FINDINGS: Brain: No restricted diffusion to suggest acute or subacute infarct. No acute hemorrhage, mass, mass effect, or midline shift. No hydrocephalus or extra-axial collection. Normal pituitary and craniocervical junction. No hemosiderin deposition to suggest remote hemorrhage. Age related cerebral atrophy. Confluent T2 hyperintense signal in the periventricular white matter, likely the sequela of moderate chronic small vessel ischemic disease. Vascular: Normal arterial flow voids. Skull and upper cervical spine: Normal marrow signal. Sinuses/Orbits: Clear paranasal sinuses. Status post bilateral lens replacements. Other: Trace fluid in the right mastoid air cells. IMPRESSION: No acute intracranial process. No evidence of acute or subacute infarct. Electronically Signed   By: Wiliam Ke M.D.   On: 12/24/2022 23:05   EEG adult  Result Date: 12/24/2022 Charlsie Quest, MD     12/24/2022  5:13 PM Patient Name: Kimberly Frye MRN: [id number] Epilepsy Attending: Charlsie Quest Referring Physician/Provider: Marjorie Smolder, NP Date: 12/24/2022 Duration: 23.10 mins Patient history: 89yo F with ams getting eeg to evaluate for seizure Level of alertness: Awake AEDs during EEG study: None Technical aspects: This EEG study was done with scalp electrodes positioned according to the 10-20 International system of electrode placement. Electrical activity was reviewed with band pass filter of 1-70Hz , sensitivity of 7 uV/mm, display speed of 68mm/sec with a 60Hz  notched filter applied as appropriate. EEG data were recorded continuously and digitally stored.  Video monitoring was available and reviewed as appropriate. Description: The posterior dominant rhythm consists of 8 Hz activity of moderate voltage (25-35 uV) seen predominantly in  posterior head regions, symmetric and reactive to eye opening and eye closing. EEG showed intermittent generalized 3 to 6 Hz theta-delta slowing. Hyperventilation and photic stimulation were not performed.   ABNORMALITY - Intermittent slow, generalized IMPRESSION: This study is suggestive of mild diffuse encephalopathy, nonspecific etiology. No seizures or epileptiform discharges were seen throughout the recording. Priyanka Annabelle Harman   CT HEAD WO CONTRAST ( )  Result Date: 12/22/2022 CLINICAL DATA:  Delirium EXAM: CT HEAD WITHOUT CONTRAST TECHNIQUE: Contiguous axial images were obtained from the base of the skull through the vertex without intravenous contrast. RADIATION DOSE REDUCTION: This exam was performed according to the departmental dose-optimization program which includes automated exposure control, adjustment of the mA and/or kV according to patient size and/or use of iterative reconstruction technique. COMPARISON:  CT head 04/15/2011 FINDINGS: Brain: No intracranial hemorrhage, mass effect, or evidence of acute infarct. No hydrocephalus. No extra-axial fluid collection. Generalized cerebral atrophy. Ill-defined hypoattenuation within the cerebral white matter is nonspecific but consistent with chronic small vessel ischemic disease. Vascular: No hyperdense vessel. Intracranial arterial calcification. Skull: No fracture or focal lesion. Sinuses/Orbits: No acute finding. Paranasal sinuses and mastoid air cells are well aerated. Other: None. IMPRESSION: 1. No evidence of acute intracranial abnormality. 2. Chronic small vessel ischemic disease and cerebral atrophy. Electronically Signed   By: Minerva Fester M.D.   On: 12/22/2022 02:08    Microbiology: No results found for this or  any previous visit.  Labs: CBC: Recent Labs  Lab 12/21/22 2249 12/22/22 1411 12/23/22 0240 12/24/22 0540  WBC 6.8 7.3 6.1 6.3  HGB 11.4* 12.5 10.7* 11.4*  HCT 33.6* 37.2 32.6* 34.4*  MCV 89.8 90.7 89.6 90.8  PLT  233 230 231 239   Basic Metabolic Panel: Recent Labs  Lab 12/21/22 2249 12/22/22 1411 12/23/22 0240  NA 138  --  137  K 4.3  --  3.8  CL 102  --  107  CO2 27  --  24  GLUCOSE 119*  --  87  BUN 35*  --  20  CREATININE 1.57* 1.12* 1.11*  CALCIUM 10.0  --  9.2   Liver Function Tests: Recent Labs  Lab 12/21/22 2249  AST 19  ALT 9  ALKPHOS 83  BILITOT 0.4  PROT 7.3  ALBUMIN 4.5   CBG: Recent Labs  Lab 12/21/22 2253 12/24/22 0805 12/24/22 1214 12/24/22 1623 12/25/22 0833  GLUCAP 116* 104* 181* 111* 104*    Discharge time spent: less than 30 minutes.  Signed: Rolm Gala, MD [facility] 12/25/2022

## 2022-12-28 LAB — VITAMIN B1: Vitamin B1 (Thiamine): 116.6 nmol/L (ref 66.5–200.0)

## 2022-12-29 ENCOUNTER — Non-Acute Institutional Stay (SKILLED_NURSING_FACILITY): Payer: Medicare Other | Admitting: Nurse Practitioner

## 2022-12-29 ENCOUNTER — Encounter: Payer: Self-pay | Admitting: Nurse Practitioner

## 2022-12-29 DIAGNOSIS — R4189 Other symptoms and signs involving cognitive functions and awareness: Secondary | ICD-10-CM | POA: Diagnosis not present

## 2022-12-29 DIAGNOSIS — G319 Degenerative disease of nervous system, unspecified: Secondary | ICD-10-CM

## 2022-12-29 DIAGNOSIS — F419 Anxiety disorder, unspecified: Secondary | ICD-10-CM

## 2022-12-29 DIAGNOSIS — N1832 Chronic kidney disease, stage 3b: Secondary | ICD-10-CM | POA: Diagnosis not present

## 2022-12-29 DIAGNOSIS — R5381 Other malaise: Secondary | ICD-10-CM | POA: Diagnosis not present

## 2022-12-29 DIAGNOSIS — I10 Essential (primary) hypertension: Secondary | ICD-10-CM | POA: Diagnosis not present

## 2022-12-29 DIAGNOSIS — E039 Hypothyroidism, unspecified: Secondary | ICD-10-CM

## 2022-12-29 NOTE — Progress Notes (Signed)
Location:  Other [place].  Nursing Home Room Number: [facility]A Place of Service:  SNF (31) Abbey Chatters, NP  PCP: Fatima Sanger, FNP  Patient Care Team: Fatima Sanger, FNP as PCP - General (Internal Medicine)  Extended Emergency Contact Information Primary Emergency Contact: [facility] Address: [address]          [place], [place] [address] Darden Amber of [place] Home Phone: [phone] Work Phone: [phone] Mobile Phone: [phone] Relation: Son Secondary Emergency Contact: Eddins,Susan Address: [address]          [place], [place] [address] Darden Amber of [place] Home Phone: [phone] Mobile Phone: [phone] Relation: Daughter  Goals of care: Advanced Directive information    12/29/2022   11:06 AM  Advanced Directives  Does Patient Have a Medical Advance Directive? No  Would patient like information on creating a medical advance directive? No - Patient declined     Chief Complaint  Patient presents with   Hospitalization Follow-up    Hospital Follow up    HPI:  Pt is a 89 y.o. female seen today for Hospital Follow up. Pt with hx of anxiety, depression, CKD, htn, hypothyroid. She was brought to the hospital due to acute confusion and hallucinations.  CT scan without acute findings but noted chronic small vessel disease at atrophy. Her mentation improved and she was sent to SNF for rehab.  Continues to have significant memory deficit. She reports the year was 30 and it was June. She admits her memory is not what it used to be. She is ready to go home with the help of her family.  Past Medical History:  Diagnosis Date   Anxiety    Hypothyroidism    History reviewed. No pertinent surgical history.  Allergies  Allergen Reactions   Bystolic [Nebivolol Hcl] Swelling    Leg edema   Celexa [Citalopram Hydrobromide] Other (See Comments)    "In a daze"   Penicillins Other (See Comments)    Unknown reaction    Outpatient  Encounter Medications as of 12/29/2022  Medication Sig   amLODipine (NORVASC) 2.5 MG tablet Take 1 tablet (2.5 mg total) by mouth daily.   cetirizine (ZYRTEC ALLERGY) 10 MG tablet Take 10 mg by mouth daily as needed for allergies.   diclofenac Sodium (VOLTAREN) 1 % GEL Apply 2 g topically every 6 (six) hours as needed.   sertraline (ZOLOFT) 25 MG tablet Take 25 mg by mouth every evening.   SYNTHROID 75 MCG tablet Take 75 mcg by mouth daily.   [DISCONTINUED] diclofenac Sodium (VOLTAREN) 1 % GEL Apply 4 g topically 4 (four) times daily as needed. (Patient taking differently: Apply 4 g topically every 6 (six) hours as needed.)   No facility-administered encounter medications on file as of 12/29/2022.    Review of Systems  Constitutional:  Negative for activity change, appetite change, fatigue and unexpected weight change.  HENT:  Negative for congestion and hearing loss.   Eyes: Negative.   Respiratory:  Negative for cough and shortness of breath.   Cardiovascular:  Negative for chest pain, palpitations and leg swelling.  Gastrointestinal:  Negative for abdominal pain, constipation and diarrhea.  Genitourinary:  Negative for difficulty urinating and dysuria.  Musculoskeletal:  Negative for arthralgias and myalgias.  Skin:  Negative for color change and wound.  Neurological:  Negative for dizziness and weakness.  Psychiatric/Behavioral:  Negative for agitation, behavioral problems and confusion.      Immunization History  Administered Date(s) Administered  Influenza, Quadrivalent, Recombinant, Inj, Pf 06/02/2018, 06/06/2019   Influenza-Unspecified 05/13/2018   PFIZER(Purple Top)SARS-COV-2 Vaccination 09/16/2019, 10/07/2019   PNEUMOCOCCAL CONJUGATE-20 07/21/2021   Pneumococcal Conjugate-13 11/21/2015   Td (Adult),5 Lf Tetanus Toxid, Preservative Free 06/11/2015   Pertinent  Health Maintenance Due  Topic Date Due   DEXA SCAN  Never done   INFLUENZA VACCINE  02/11/2023      02/13/2019     3:30 PM  Fall Risk  Number of falls in past year - Comments Emmi Telephone Survey Actual Response =    Functional Status Survey:    Vitals:   12/29/22 1100 12/29/22 1249  BP: (!) 167/62 110/60  Pulse: 66   Resp: 18   Temp: 97.7 F (36.5 C)   SpO2: 97%   Weight: 93 lb (42.2 kg)   Height: 5\' 3"  (1.6 m)    Body mass index is 16.47 kg/m. Physical Exam Constitutional:      General: She is not in acute distress.    Appearance: She is well-developed. She is not diaphoretic.  HENT:     Head: Normocephalic and atraumatic.     Mouth/Throat:     Pharynx: No oropharyngeal exudate.  Eyes:     Conjunctiva/sclera: Conjunctivae normal.     Pupils: Pupils are equal, round, and reactive to light.  Cardiovascular:     Rate and Rhythm: Normal rate and regular rhythm.     Heart sounds: Normal heart sounds.  Pulmonary:     Effort: Pulmonary effort is normal.     Breath sounds: Normal breath sounds.  Abdominal:     General: Bowel sounds are normal.     Palpations: Abdomen is soft.  Musculoskeletal:     Cervical back: Normal range of motion and neck supple.     Right lower leg: No edema.     Left lower leg: No edema.  Skin:    General: Skin is warm and dry.  Neurological:     Mental Status: She is alert.     Motor: No weakness.     Gait: Gait normal.     Comments: Oriented to self  Psychiatric:        Mood and Affect: Mood normal.     Labs reviewed: Recent Labs    12/21/22 2249 12/22/22 1411 12/23/22 0240  NA 138  --  137  K 4.3  --  3.8  CL 102  --  107  CO2 27  --  24  GLUCOSE 119*  --  87  BUN 35*  --  20  CREATININE 1.57* 1.12* 1.11*  CALCIUM 10.0  --  9.2   Recent Labs    12/21/22 2249  AST 19  ALT 9  ALKPHOS 83  BILITOT 0.4  PROT 7.3  ALBUMIN 4.5   Recent Labs    12/22/22 1411 12/23/22 0240 12/24/22 0540  WBC 7.3 6.1 6.3  HGB 12.5 10.7* 11.4*  HCT 37.2 32.6* 34.4*  MCV 90.7 89.6 90.8  PLT 230 231 239   Lab Results  Component Value Date    TSH 2.195 12/22/2022   No results found for: "HGBA1C" No results found for: "CHOL", "HDL", "LDLCALC", "LDLDIRECT", "TRIG", "CHOLHDL"  Significant Diagnostic Results in last 30 days:  MR BRAIN WO CONTRAST  Result Date: 12/24/2022 CLINICAL DATA:  Mental status change EXAM: MRI HEAD WITHOUT CONTRAST TECHNIQUE: Multiplanar, multiecho pulse sequences of the brain and surrounding structures were obtained without intravenous contrast. COMPARISON:  No prior MRI available, correlation is made with CT  head 12/22/2022 FINDINGS: Brain: No restricted diffusion to suggest acute or subacute infarct. No acute hemorrhage, mass, mass effect, or midline shift. No hydrocephalus or extra-axial collection. Normal pituitary and craniocervical junction. No hemosiderin deposition to suggest remote hemorrhage. Age related cerebral atrophy. Confluent T2 hyperintense signal in the periventricular white matter, likely the sequela of moderate chronic small vessel ischemic disease. Vascular: Normal arterial flow voids. Skull and upper cervical spine: Normal marrow signal. Sinuses/Orbits: Clear paranasal sinuses. Status post bilateral lens replacements. Other: Trace fluid in the right mastoid air cells. IMPRESSION: No acute intracranial process. No evidence of acute or subacute infarct. Electronically Signed   By: Wiliam Ke M.D.   On: 12/24/2022 23:05   EEG adult  Result Date: 12/24/2022 Charlsie Quest, MD     12/24/2022  5:13 PM Patient Name: NAMIA CHRISTOFFERSEN MRN: [id number] Epilepsy Attending: Charlsie Quest Referring Physician/Provider: Marjorie Smolder, NP Date: 12/24/2022 Duration: 23.10 mins Patient history: 89yo F with ams getting eeg to evaluate for seizure Level of alertness: Awake AEDs during EEG study: None Technical aspects: This EEG study was done with scalp electrodes positioned according to the 10-20 International system of electrode placement. Electrical activity was reviewed with band pass filter of  1-70Hz , sensitivity of 7 uV/mm, display speed of 26mm/sec with a 60Hz  notched filter applied as appropriate. EEG data were recorded continuously and digitally stored.  Video monitoring was available and reviewed as appropriate. Description: The posterior dominant rhythm consists of 8 Hz activity of moderate voltage (25-35 uV) seen predominantly in posterior head regions, symmetric and reactive to eye opening and eye closing. EEG showed intermittent generalized 3 to 6 Hz theta-delta slowing. Hyperventilation and photic stimulation were not performed.   ABNORMALITY - Intermittent slow, generalized IMPRESSION: This study is suggestive of mild diffuse encephalopathy, nonspecific etiology. No seizures or epileptiform discharges were seen throughout the recording. Priyanka Annabelle Harman   CT HEAD WO CONTRAST ( )  Result Date: 12/22/2022 CLINICAL DATA:  Delirium EXAM: CT HEAD WITHOUT CONTRAST TECHNIQUE: Contiguous axial images were obtained from the base of the skull through the vertex without intravenous contrast. RADIATION DOSE REDUCTION: This exam was performed according to the departmental dose-optimization program which includes automated exposure control, adjustment of the mA and/or kV according to patient size and/or use of iterative reconstruction technique. COMPARISON:  CT head 04/15/2011 FINDINGS: Brain: No intracranial hemorrhage, mass effect, or evidence of acute infarct. No hydrocephalus. No extra-axial fluid collection. Generalized cerebral atrophy. Ill-defined hypoattenuation within the cerebral white matter is nonspecific but consistent with chronic small vessel ischemic disease. Vascular: No hyperdense vessel. Intracranial arterial calcification. Skull: No fracture or focal lesion. Sinuses/Orbits: No acute finding. Paranasal sinuses and mastoid air cells are well aerated. Other: None. IMPRESSION: 1. No evidence of acute intracranial abnormality. 2. Chronic small vessel ischemic disease and cerebral  atrophy. Electronically Signed   By: Minerva Fester M.D.   On: 12/22/2022 02:08    Assessment/Plan 1. Stage 3b chronic kidney disease (HCC) -Chronic and stable Encourage proper hydration Follow metabolic panel Avoid nephrotoxic meds (NSAIDS)  2. Mild anxiety -continues on zoloft.   3. Essential hypertension -Blood pressure well controlled, goal bp <140/90 Continue current medications and dietary modifications follow metabolic panel  4. Cognitive impairment Ongoing, will need increase in assistance once she is discharged home.  Neurology consult recommended   5. Debility -continues to work with therapy, ambulating without assistance.   6. Acquired hypothyroidism TSH at goal on synthroid 75 mcg  7. Cerebral  atrophy (HCC) Noted on CT   Larance Ratledge K. Biagio [employer] [facility] [phone]

## 2022-12-31 LAB — COMPREHENSIVE METABOLIC PANEL
Calcium: 9.6 (ref 8.7–10.7)
eGFR: 54

## 2022-12-31 LAB — BASIC METABOLIC PANEL
BUN: 41 — AB (ref 4–21)
CO2: 28 — AB (ref 13–22)
Chloride: 103 (ref 99–108)
Creatinine: 1 (ref 0.5–1.1)
Glucose: 88
Potassium: 4.4 mEq/L (ref 3.5–5.1)
Sodium: 136 — AB (ref 137–147)

## 2023-01-01 ENCOUNTER — Telehealth: Payer: Self-pay

## 2023-01-01 ENCOUNTER — Encounter: Payer: Self-pay | Admitting: Student

## 2023-01-01 ENCOUNTER — Non-Acute Institutional Stay (SKILLED_NURSING_FACILITY): Payer: Medicare Other | Admitting: Student

## 2023-01-01 DIAGNOSIS — I10 Essential (primary) hypertension: Secondary | ICD-10-CM | POA: Diagnosis not present

## 2023-01-01 DIAGNOSIS — E440 Moderate protein-calorie malnutrition: Secondary | ICD-10-CM

## 2023-01-01 DIAGNOSIS — E039 Hypothyroidism, unspecified: Secondary | ICD-10-CM | POA: Diagnosis not present

## 2023-01-01 DIAGNOSIS — N1832 Chronic kidney disease, stage 3b: Secondary | ICD-10-CM | POA: Diagnosis not present

## 2023-01-01 DIAGNOSIS — G319 Degenerative disease of nervous system, unspecified: Secondary | ICD-10-CM | POA: Diagnosis not present

## 2023-01-01 DIAGNOSIS — G9340 Encephalopathy, unspecified: Secondary | ICD-10-CM | POA: Diagnosis not present

## 2023-01-01 DIAGNOSIS — F323 Major depressive disorder, single episode, severe with psychotic features: Secondary | ICD-10-CM | POA: Diagnosis not present

## 2023-01-01 DIAGNOSIS — I700 Atherosclerosis of aorta: Secondary | ICD-10-CM

## 2023-01-01 DIAGNOSIS — R4189 Other symptoms and signs involving cognitive functions and awareness: Secondary | ICD-10-CM

## 2023-01-01 NOTE — Progress Notes (Unsigned)
Provider:  Dr. Earnestine Mealing Location:  Other [facility].  Nursing Home Room Number: [facility] 105A Place of Service:  SNF (31)  PCP: Fatima Sanger, FNP Patient Care Team: Fatima Sanger, FNP as PCP - General (Internal Medicine)  Extended Emergency Contact Information Primary Emergency Contact: [employer] Address: [address]          [place], [place] [address] Darden Amber of [place] Home Phone: [phone] Work Phone: [phone] Mobile Phone: [phone] Relation: Son Secondary Emergency Contact: Waynick,Susan Address: [address]          [place], [place] [address] Darden Amber of [place] Home Phone: [phone] Mobile Phone: [phone] Relation: Daughter  Code Status: Full Code.  Goals of Care: Advanced Directive information    12/29/2022   11:06 AM  Advanced Directives  Does Patient Have a Medical Advance Directive? No  Would patient like information on creating a medical advance directive? No - Patient declined      Chief Complaint  Patient presents with   New Admit To SNF    Admission.     HPI: Patient is a 89 y.o. female seen today for admission to [facility].   She went to the hospital because DIL decided somethjing was wrong with me and that I needed to go.   She isn't sure about why she had to go to the hospital. She thought she would get some pills and then go home, but now she is here.   Nothing seems to be going the way she hoped since she was hoping she could go home yesterday.   She lives in a townhouse alone. She has neighbors nearby. She had one fall 4-5 weeks ago. Her leg had healed and she was fine. She drives. Takes care of her medications. She denies issues with bills, but her son and his wife want to start help to make sure they were all paid.  She has someone come once a month to make sure everything is clean. She doesn't cook much. She eats out more often. She usually weighs right at 100 lbs. No issues with showering  and getting dressed.   She is born and raised in [place]. 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 She has two children she raised. She is confused about what she has done wrong to need more help.   Attempted to call her son, Mayson Sterbenz for an update no answer. LVM  Past Medical History:  Diagnosis Date   Anxiety    Hypothyroidism    History reviewed. No pertinent surgical history.  reports that she has never smoked. She has never been exposed to tobacco smoke. She has never used smokeless tobacco. She reports current alcohol use. She reports that she does not use drugs. Social History   Socioeconomic History   Marital status: Single    Spouse name: Not on file   Number of children: Not on file   Years of education: Not on file   Highest education level: Not on file  Occupational History   Not on file  Tobacco Use   Smoking status: Never    Passive exposure: Never   Smokeless tobacco: Never  Vaping Use   Vaping Use: Never used  Substance and Sexual Activity   Alcohol use: Yes    Comment: occasional   Drug use: Never   Sexual activity: Not on file  Other Topics Concern   Not on file  Social History Narrative   Not on file   Social Determinants of Health  Financial Resource Strain: Not on file  Food Insecurity: No Food Insecurity (12/22/2022)   Hunger Vital Sign    Worried About Running Out of Food in the Last Year: Never true    Ran Out of Food in the Last Year: Never true  Transportation Needs: No Transportation Needs (12/22/2022)   PRAPARE - Administrator, Civil Service (Medical): No    Lack of Transportation (Non-Medical): No  Physical Activity: Not on file  Stress: Not on file  Social Connections: Not on file  Intimate Partner Violence: Not At Risk (12/22/2022)   Humiliation, Afraid, Rape, and Kick questionnaire    Fear of Current or Ex-Partner: No    Emotionally Abused: No    Physically Abused: No    Sexually Abused: No    Functional Status Survey:     History reviewed. No pertinent family history.  Health Maintenance  Topic Date Due   Zoster Vaccines- Shingrix (1 of 2) Never done   DEXA SCAN  Never done   DTaP/Tdap/Td (1 - Tdap) 06/12/2015   COVID-19 Vaccine (3 - 2023-24 season) 03/13/2022   INFLUENZA VACCINE  02/11/2023   Medicare Annual Wellness (AWV)  07/29/2023   Pneumonia Vaccine 46+ Years old  Completed   HPV VACCINES  Aged Out    Allergies  Allergen Reactions   Bystolic [Nebivolol Hcl] Swelling    Leg edema   Celexa [Citalopram Hydrobromide] Other (See Comments)    "In a daze"   Penicillins Other (See Comments)    Unknown reaction    Outpatient Encounter Medications as of 01/01/2023  Medication Sig   amLODipine (NORVASC) 2.5 MG tablet Take 1 tablet (2.5 mg total) by mouth daily.   cetirizine (ZYRTEC ALLERGY) 10 MG tablet Take 10 mg by mouth daily as needed for allergies.   diclofenac Sodium (VOLTAREN) 1 % GEL Apply 2 g topically every 6 (six) hours as needed.   sertraline (ZOLOFT) 25 MG tablet Take 25 mg by mouth every evening.   SYNTHROID 75 MCG tablet Take 75 mcg by mouth daily.   No facility-administered encounter medications on file as of 01/01/2023.    Review of Systems  Vitals:   01/01/23 1007  BP: (!) 149/62  Pulse: 76  Resp: 18  Temp: 98 F (36.7 C)  SpO2: 97%  Weight: 97 lb (44 kg)  Height: 5\' 3"  (1.6 m)   Body mass index is 17.18 kg/m. Physical Exam Constitutional:      Comments: Thin, frail, pants falling off of hip bones  HENT:     Right Ear: Tympanic membrane normal.     Left Ear: Tympanic membrane normal.  Cardiovascular:     Rate and Rhythm: Normal rate and regular rhythm.  Pulmonary:     Effort: Pulmonary effort is normal.     Breath sounds: Normal breath sounds.  Abdominal:     General: Abdomen is flat.     Palpations: Abdomen is soft.  Skin:    General: Skin is warm.  Neurological:     General: No focal deficit present.     Mental Status: She is alert. She is  disoriented.     Comments: SLUMS 12/30     Labs reviewed: Basic Metabolic Panel: Recent Labs    12/21/22 2249 12/22/22 1411 12/23/22 0240 12/31/22 0000  NA 138  --  137 136*  K 4.3  --  3.8 4.4  CL 102  --  107 103  CO2 27  --  24 28*  GLUCOSE  119*  --  87  --   BUN 35*  --  20 41*  CREATININE 1.57* 1.12* 1.11* 1.0  CALCIUM 10.0  --  9.2 9.6   Liver Function Tests: Recent Labs    12/21/22 2249  AST 19  ALT 9  ALKPHOS 83  BILITOT 0.4  PROT 7.3  ALBUMIN 4.5   No results for input(s): "LIPASE", "AMYLASE" in the last 8760 hours. No results for input(s): "AMMONIA" in the last 8760 hours. CBC: Recent Labs    12/22/22 1411 12/23/22 0240 12/24/22 0540  WBC 7.3 6.1 6.3  HGB 12.5 10.7* 11.4*  HCT 37.2 32.6* 34.4*  MCV 90.7 89.6 90.8  PLT 230 231 239   Cardiac Enzymes: No results for input(s): "CKTOTAL", "CKMB", "CKMBINDEX", "TROPONINI" in the last 8760 hours. BNP: Invalid input(s): "POCBNP" No results found for: "HGBA1C" Lab Results  Component Value Date   TSH 2.195 12/22/2022   Lab Results  Component Value Date   VITAMINB12 512 12/22/2022   Lab Results  Component Value Date   FOLATE 12.8 12/25/2022   Lab Results  Component Value Date   IRON 67 12/22/2022   TIBC 361 12/22/2022   FERRITIN 36 12/22/2022    Imaging and Procedures obtained prior to SNF admission: CT HEAD WO CONTRAST ( )  Result Date: 12/22/2022 CLINICAL DATA:  Delirium EXAM: CT HEAD WITHOUT CONTRAST TECHNIQUE: Contiguous axial images were obtained from the base of the skull through the vertex without intravenous contrast. RADIATION DOSE REDUCTION: This exam was performed according to the departmental dose-optimization program which includes automated exposure control, adjustment of the mA and/or kV according to patient size and/or use of iterative reconstruction technique. COMPARISON:  CT head 04/15/2011 FINDINGS: Brain: No intracranial hemorrhage, mass effect, or evidence of acute  infarct. No hydrocephalus. No extra-axial fluid collection. Generalized cerebral atrophy. Ill-defined hypoattenuation within the cerebral white matter is nonspecific but consistent with chronic small vessel ischemic disease. Vascular: No hyperdense vessel. Intracranial arterial calcification. Skull: No fracture or focal lesion. Sinuses/Orbits: No acute finding. Paranasal sinuses and mastoid air cells are well aerated. Other: None. IMPRESSION: 1. No evidence of acute intracranial abnormality. 2. Chronic small vessel ischemic disease and cerebral atrophy. Electronically Signed   By: Minerva Fester M.D.   On: 12/22/2022 02:08    Assessment/Plan Encephalopathy  Cognitive impairment - Plan: Ambulatory referral to Neurology  Stage 3b chronic kidney disease (HCC)  Essential hypertension  Acquired hypothyroidism  Cerebral atrophy (HCC) - Plan: Ambulatory referral to Neurology  Moderate protein-calorie malnutrition (HCC)  Abdominal aortic atherosclerosis (HCC), Chronic  Major depressive disorder, single episode, severe with psychotic features [facility]) Patient seen today for admission to [facility] after hospitalization. Patient is independent and has been driving independently. She is not receptive that she likely has memory decline. Discussed she recently had hospitalization for delirium and likely needs a repeat in upcoming months, however, very concerned about her memory given SLUMS 12/30. Plan to increase sertraline to therapeutic dose over the next 1-2 weeks to make sure this is not contributing to her current state. AKI while inpatient improved with fluids, discussed concern this is due to poor nutrition. BMI low and some weight loss. Continue supportive care and therapy at this time. Patient will likely need AL or Memory care for safety concerns. Consultation for neurology also placed per patient and family preference.   Family/ staff Communication: Nursing  Labs/tests ordered: CBC BMP

## 2023-01-01 NOTE — Telephone Encounter (Signed)
Patient's daughter Darl Pikes) called to see if patient could be referred to a neurologist and to see what days patient could be seen at [facility] with Dr. Sydnee Cabal or Wyatt Mage. Daughter was advised that days that both Dr. Sydnee Cabal and Shanda Bumps works and that she could come to [facility] on Monday's and Friday's as well to see Wyatt Mage in our office if need too. Daughter states she will reach out to Dr.Beamer and Shanda Bumps via patient MyChart if she have any other questions.

## 2023-01-02 DIAGNOSIS — R4189 Other symptoms and signs involving cognitive functions and awareness: Secondary | ICD-10-CM | POA: Insufficient documentation

## 2023-01-02 DIAGNOSIS — G319 Degenerative disease of nervous system, unspecified: Secondary | ICD-10-CM | POA: Insufficient documentation

## 2023-01-02 DIAGNOSIS — N1832 Chronic kidney disease, stage 3b: Secondary | ICD-10-CM | POA: Insufficient documentation

## 2023-01-02 DIAGNOSIS — F323 Major depressive disorder, single episode, severe with psychotic features: Secondary | ICD-10-CM | POA: Insufficient documentation

## 2023-01-02 DIAGNOSIS — E440 Moderate protein-calorie malnutrition: Secondary | ICD-10-CM | POA: Insufficient documentation

## 2023-01-04 NOTE — Telephone Encounter (Signed)
Earnestine Mealing, MD  You; Janyth Contes, Janene Harvey, NP1 hour ago (1:09 PM)   She was recently admitted for therapy. Concern for dementia diagnosis. They want a full neuropsych eval I believe. SLUMS 12/30 on admission. Referral written for [facility] this morning.   Patient's daughter was advised about the referral and said thank you so much.

## 2023-01-04 NOTE — Telephone Encounter (Signed)
She is an SNF patient, if she [facility] we will see her there long term otherwise would have to come to office to see [place] (if she does not live at [facility])

## 2023-01-04 NOTE — Telephone Encounter (Signed)
Is she wanting to establish care? It does not look like she is even our patient

## 2023-01-13 ENCOUNTER — Ambulatory Visit: Payer: Medicare Other | Admitting: Physician Assistant

## 2023-01-29 ENCOUNTER — Non-Acute Institutional Stay (SKILLED_NURSING_FACILITY): Payer: Medicare Other | Admitting: Student

## 2023-01-29 ENCOUNTER — Encounter: Payer: Self-pay | Admitting: Student

## 2023-01-29 DIAGNOSIS — N1832 Chronic kidney disease, stage 3b: Secondary | ICD-10-CM | POA: Diagnosis not present

## 2023-01-29 DIAGNOSIS — E440 Moderate protein-calorie malnutrition: Secondary | ICD-10-CM

## 2023-01-29 DIAGNOSIS — I10 Essential (primary) hypertension: Secondary | ICD-10-CM

## 2023-01-29 DIAGNOSIS — R4189 Other symptoms and signs involving cognitive functions and awareness: Secondary | ICD-10-CM

## 2023-01-29 DIAGNOSIS — F323 Major depressive disorder, single episode, severe with psychotic features: Secondary | ICD-10-CM | POA: Diagnosis not present

## 2023-01-29 DIAGNOSIS — G319 Degenerative disease of nervous system, unspecified: Secondary | ICD-10-CM | POA: Diagnosis not present

## 2023-01-29 NOTE — Progress Notes (Unsigned)
Location:  [facility].  Nursing Home Room Number: [facility] 105A Place of Service:  SNF [phone]) Provider:  Dr. Earnestine Mealing  PCP: Fatima Sanger, FNP  Patient Care Team: Fatima Sanger, FNP as PCP - General (Internal Medicine)  Extended Emergency Contact Information Primary Emergency Contact: [facility] Address: [address]          [place], [place] [address] Darden Amber of [place] Home Phone: [phone] Work Phone: [phone] Mobile Phone: [phone] Relation: Son Secondary Emergency Contact: Farrington,Susan Address: [address]          [place], [place] [address] D[place] of [place] Home Phone: [phone] Mobile Phone: [phone] Relation: Daughter  Code Status:  Full Code Goals of care: Advanced Directive information    01/29/2023    9:14 AM  Advanced Directives  Does Patient Have a Medical Advance Directive? No  Would patient like information on creating a medical advance directive? No - Patient declined     Chief Complaint  Patient presents with  . Medical Management of Chronic Issues    Medical Management of Chronic Issues.     HPI:  Pt is a 89 y.o. female seen today for medical management of chronic diseases.    Patient is sitting on the patio by herself. Patient states name and date of birth as well as [place] and states we are in [place]. She States we are at the hospital. Discussed her current status and concern for returning home at this time. She is unable to give explanation for risks and benefits of living alone. Asked who pays her bills and she states her son has taken over but she can do it. Asked what is the name of the power    Past Medical History:  Diagnosis Date  . Anxiety   . Hypothyroidism    History reviewed. No pertinent surgical history.  Allergies  Allergen Reactions  . Bystolic [Nebivolol Hcl] Swelling    Leg edema  . Celexa [Citalopram Hydrobromide] Other (See Comments)    "In a daze"  .  Penicillins Other (See Comments)    Unknown reaction    Outpatient Encounter Medications as of 01/29/2023  Medication Sig  . amLODipine (NORVASC) 2.5 MG tablet Take 1 tablet (2.5 mg total) by mouth daily.  . cetirizine (ZYRTEC ALLERGY) 10 MG tablet Take 10 mg by mouth daily as needed for allergies.  Marland Kitchen diclofenac Sodium (VOLTAREN) 1 % GEL Apply 2 g topically every 6 (six) hours as needed.  . sertraline (ZOLOFT) 25 MG tablet Take 25 mg by mouth every evening.  Marland Kitchen SYNTHROID 75 MCG tablet Take 75 mcg by mouth daily.   No facility-administered encounter medications on file as of 01/29/2023.    Review of Systems  Immunization History  Administered Date(s) Administered  . Influenza, Quadrivalent, Recombinant, Inj, Pf 06/02/2018, 06/06/2019  . Influenza-Unspecified 05/13/2018  . PFIZER(Purple Top)SARS-COV-2 Vaccination 09/16/2019, 10/07/2019  . PNEUMOCOCCAL CONJUGATE-20 07/21/2021, 12/30/2022  . Pneumococcal Conjugate-13 11/21/2015  . RSV,unspecified 12/30/2022  . Td (Adult),5 Lf Tetanus Toxid, Preservative Free 06/11/2015   Pertinent  Health Maintenance Due  Topic Date Due  . DEXA SCAN  Never done  . INFLUENZA VACCINE  02/11/2023      02/13/2019    3:30 PM  Fall Risk  Falls in the past year? --  Number of falls in past year - Comments Emmi Telephone Survey Actual Response =    Functional Status Survey:    Vitals:   01/29/23 0907  BP: (!) 109/58  Pulse: 79  Resp: 14  Temp: 97.8 F (36.6 C)  SpO2: 96%  Weight: 103 lb 6.4 oz (46.9 kg)  Height: 5\' 3"  (1.6 m)   Body mass index is 18.32 kg/m. Physical Exam  Labs reviewed: Recent Labs    12/21/22 2249 12/22/22 1411 12/23/22 0240 12/31/22 0000  NA 138  --  137 136*  K 4.3  --  3.8 4.4  CL 102  --  107 103  CO2 27  --  24 28*  GLUCOSE 119*  --  87  --   BUN 35*  --  20 41*  CREATININE 1.57* 1.12* 1.11* 1.0  CALCIUM 10.0  --  9.2 9.6   Recent Labs    12/21/22 2249  AST 19  ALT 9  ALKPHOS 83  BILITOT 0.4  PROT  7.3  ALBUMIN 4.5   Recent Labs    12/22/22 1411 12/23/22 0240 12/24/22 0540  WBC 7.3 6.1 6.3  HGB 12.5 10.7* 11.4*  HCT 37.2 32.6* 34.4*  MCV 90.7 89.6 90.8  PLT 230 231 239   Lab Results  Component Value Date   TSH 2.195 12/22/2022   No results found for: "HGBA1C" No results found for: "CHOL", "HDL", "LDLCALC", "LDLDIRECT", "TRIG", "CHOLHDL"  Significant Diagnostic Results in last 30 days:  No results found.  Assessment/Plan There are no diagnoses linked to this encounter.   Family/ staff Communication: ***  Labs/tests ordered:  ***

## 2023-02-01 ENCOUNTER — Ambulatory Visit: Payer: Medicare Other | Admitting: Physician Assistant

## 2023-02-01 ENCOUNTER — Ambulatory Visit: Payer: Medicare Other

## 2023-02-02 ENCOUNTER — Ambulatory Visit: Payer: Medicare Other | Admitting: Physician Assistant

## 2023-02-02 ENCOUNTER — Ambulatory Visit: Payer: Medicare Other

## 2023-02-03 ENCOUNTER — Encounter: Payer: Self-pay | Admitting: Student

## 2023-02-04 ENCOUNTER — Encounter: Payer: Self-pay | Admitting: Nurse Practitioner

## 2023-02-04 ENCOUNTER — Non-Acute Institutional Stay (SKILLED_NURSING_FACILITY): Payer: Medicare Other | Admitting: Nurse Practitioner

## 2023-02-04 DIAGNOSIS — N1832 Chronic kidney disease, stage 3b: Secondary | ICD-10-CM

## 2023-02-04 DIAGNOSIS — E039 Hypothyroidism, unspecified: Secondary | ICD-10-CM | POA: Diagnosis not present

## 2023-02-04 DIAGNOSIS — R4189 Other symptoms and signs involving cognitive functions and awareness: Secondary | ICD-10-CM | POA: Diagnosis not present

## 2023-02-04 DIAGNOSIS — F323 Major depressive disorder, single episode, severe with psychotic features: Secondary | ICD-10-CM | POA: Diagnosis not present

## 2023-02-04 DIAGNOSIS — I10 Essential (primary) hypertension: Secondary | ICD-10-CM | POA: Diagnosis not present

## 2023-02-04 NOTE — Progress Notes (Signed)
Location:  [facility].  Nursing Home Room Number: [facility] 105A Place of Service:  SNF (31) Abbey Chatters, NP  PCP: Fatima Sanger, FNP  Patient Care Team: Fatima Sanger, FNP as PCP - General (Internal Medicine)  Extended Emergency Contact Information Primary Emergency Contact: [facility] Address: [address]          [place], [place] [address] D[place] Home Phone: [phone] Work Phone: [phone] Mobile Phone: [phone] Relation: Son Secondary Emergency Contact: Kittleson,Susan Address: [address]          [place], [place] [address] [place] Home Phone: [phone] Mobile Phone: [phone] Relation: Daughter  Goals of care: Advanced Directive information    02/04/2023    9:09 AM  Advanced Directives  Does Patient Have a Medical Advance Directive? No  Would patient like information on creating a medical advance directive? No - Patient declined     Chief Complaint  Patient presents with   Discharge Note    Discharge.     HPI:  Pt is a 89 y.o. female seen today for Discharge. Pt with hx of depression, anxiety, CKD, htn, hypothyroid, cognitive impairment. Pt was previously living at home alone and family noted behaviors with increase in confusion and was hospitalized. Noted to have AKI.  She was discharged to [facility] for short term rehab She continues to be confused but without any acute delirium.  CKD at baseline on recent lab She is oriented to self and time.  She does not know what her address is but reports she lives in [place].  No increase in anxiety or depression.  Blood pressures have been variable but overall controlled on norvasc She will be discharging from SNF to AL closer to family.    Past Medical History:  Diagnosis Date   Anxiety    Hypothyroidism    History reviewed. No pertinent surgical history.  Allergies  Allergen Reactions   Bystolic [Nebivolol Hcl] Swelling    Leg  edema   Celexa [Citalopram Hydrobromide] Other (See Comments)    "In a daze"   Penicillins Other (See Comments)    Unknown reaction    Outpatient Encounter Medications as of 02/04/2023  Medication Sig   amLODipine (NORVASC) 2.5 MG tablet Take 1 tablet (2.5 mg total) by mouth daily.   cetirizine (ZYRTEC ALLERGY) 10 MG tablet Take 10 mg by mouth daily as needed for allergies.   diclofenac Sodium (VOLTAREN) 1 % GEL Apply 2 g topically every 6 (six) hours as needed.   sertraline (ZOLOFT) 100 MG tablet Take 100 mg by mouth daily.   SYNTHROID 75 MCG tablet Take 75 mcg by mouth daily.   [DISCONTINUED] sertraline (ZOLOFT) 25 MG tablet Take 25 mg by mouth every evening.   No facility-administered encounter medications on file as of 02/04/2023.    Review of Systems  Constitutional:  Negative for activity change, appetite change, fatigue and unexpected weight change.  HENT:  Negative for congestion and hearing loss.   Eyes: Negative.   Respiratory:  Negative for cough and shortness of breath.   Cardiovascular:  Negative for chest pain, palpitations and leg swelling.  Gastrointestinal:  Negative for abdominal pain, constipation and diarrhea.  Genitourinary:  Negative for difficulty urinating and dysuria.  Musculoskeletal:  Negative for arthralgias and myalgias.  Skin:  Negative for color change and wound.  Neurological:  Negative for dizziness and weakness.  Psychiatric/Behavioral:  Positive for confusion (significant confusion). Negative for agitation and behavioral problems.  Immunization History  Administered Date(s) Administered   Influenza, Quadrivalent, Recombinant, Inj, Pf 06/02/2018, 06/06/2019   Influenza-Unspecified 05/13/2018   PFIZER(Purple Top)SARS-COV-2 Vaccination 09/16/2019, 10/07/2019   PNEUMOCOCCAL CONJUGATE-20 07/21/2021, 12/30/2022   Pneumococcal Conjugate-13 11/21/2015   RSV,unspecified 12/30/2022   Td (Adult),5 Lf Tetanus Toxid, Preservative Free 06/11/2015    Pertinent  Health Maintenance Due  Topic Date Due   DEXA SCAN  Never done   INFLUENZA VACCINE  02/11/2023      02/13/2019    3:30 PM  Fall Risk  Falls in the past year? --  Number of falls in past year - Comments Emmi Telephone Survey Actual Response =    Functional Status Survey:    Vitals:   02/04/23 0903 02/04/23 0910  BP: (!) 164/72 (!) 166/65  Pulse: 78   Resp: 14   Temp: 98.1 F (36.7 C)   SpO2: 95%   Weight: 102 lb 3.2 oz (46.4 kg)   Height: 5\' 3"  (1.6 m)    Body mass index is 18.1 kg/m. Physical Exam Constitutional:      General: She is not in acute distress.    Appearance: She is well-developed. She is not diaphoretic.  HENT:     Head: Normocephalic and atraumatic.     Mouth/Throat:     Pharynx: No oropharyngeal exudate.  Eyes:     Conjunctiva/sclera: Conjunctivae normal.     Pupils: Pupils are equal, round, and reactive to light.  Cardiovascular:     Rate and Rhythm: Normal rate and regular rhythm.     Heart sounds: Normal heart sounds.  Pulmonary:     Effort: Pulmonary effort is normal.     Breath sounds: Normal breath sounds.  Abdominal:     General: Bowel sounds are normal.     Palpations: Abdomen is soft.  Musculoskeletal:     Cervical back: Normal range of motion and neck supple.     Right lower leg: No edema.     Left lower leg: No edema.  Skin:    General: Skin is warm and dry.  Neurological:     Mental Status: She is alert. She is disoriented.     Motor: No weakness.     Gait: Gait normal.  Psychiatric:        Mood and Affect: Mood normal.     Labs reviewed: Recent Labs    12/21/22 2249 12/22/22 1411 12/23/22 0240 12/31/22 0000  NA 138  --  137 136*  K 4.3  --  3.8 4.4  CL 102  --  107 103  CO2 27  --  24 28*  GLUCOSE 119*  --  87  --   BUN 35*  --  20 41*  CREATININE 1.57* 1.12* 1.11* 1.0  CALCIUM 10.0  --  9.2 9.6   Recent Labs    12/21/22 2249  AST 19  ALT 9  ALKPHOS 83  BILITOT 0.4  PROT 7.3  ALBUMIN 4.5    Recent Labs    12/22/22 1411 12/23/22 0240 12/24/22 0540  WBC 7.3 6.1 6.3  HGB 12.5 10.7* 11.4*  HCT 37.2 32.6* 34.4*  MCV 90.7 89.6 90.8  PLT 230 231 239   Lab Results  Component Value Date   TSH 2.195 12/22/2022   No results found for: "HGBA1C" No results found for: "CHOL", "HDL", "LDLCALC", "LDLDIRECT", "TRIG", "CHOLHDL"  Significant Diagnostic Results in last 30 days:  No results found.  Assessment/Plan 1. Stage 3b chronic kidney disease (HCC) -back to baseline Encourage  proper hydration Follow metabolic panel by PCP Avoid nephrotoxic meds (NSAIDS)  2. Major depressive disorder, single episode, severe with psychotic features (HCC) -stable continues on zoloft  3. Cognitive impairment Significant cognitive decline noted. Will need further evaluation once she has moved.  Continue supportive care with family.   4. Essential hypertension Blood pressure controlled, goal bp <140/90 and she has multiple bp within range when reviewed Continue current medications and dietary modifications -continue norvasc  5. Acquired hypothyroidism -TSH at goal. Continue on synthrod.   pt is stable for discharge- PT/OT not needed. DME not needed. Does not need Rx, due to being discharged to AL.  will need to follow up with PCP within 2 weeks.    Janene Harvey. [facility]ag[facility] [phone]

## 2023-02-11 ENCOUNTER — Telehealth: Payer: Self-pay | Admitting: Neurology

## 2023-02-11 NOTE — Telephone Encounter (Signed)
LVM and sent mychart msg informing pt of need to reschedule 04/01/23 appt - MD out

## 2023-02-25 DIAGNOSIS — L7612 Accidental puncture and laceration of skin and subcutaneous tissue during other procedure: Secondary | ICD-10-CM | POA: Diagnosis not present

## 2023-02-25 DIAGNOSIS — S0285XD Fracture of orbit, unspecified, subsequent encounter for fracture with routine healing: Secondary | ICD-10-CM | POA: Diagnosis not present

## 2023-02-25 DIAGNOSIS — R2689 Other abnormalities of gait and mobility: Secondary | ICD-10-CM | POA: Diagnosis not present

## 2023-02-25 DIAGNOSIS — M808AXA Other osteoporosis with current pathological fracture, other site, initial encounter for fracture: Secondary | ICD-10-CM | POA: Diagnosis not present

## 2023-02-25 DIAGNOSIS — N1330 Unspecified hydronephrosis: Secondary | ICD-10-CM | POA: Diagnosis not present

## 2023-02-25 DIAGNOSIS — R233 Spontaneous ecchymoses: Secondary | ICD-10-CM | POA: Diagnosis not present

## 2023-02-25 DIAGNOSIS — E785 Hyperlipidemia, unspecified: Secondary | ICD-10-CM | POA: Diagnosis not present

## 2023-02-25 DIAGNOSIS — Z681 Body mass index (BMI) 19 or less, adult: Secondary | ICD-10-CM | POA: Diagnosis not present

## 2023-02-25 DIAGNOSIS — R4182 Altered mental status, unspecified: Secondary | ICD-10-CM | POA: Diagnosis not present

## 2023-02-25 DIAGNOSIS — S0240CA Maxillary fracture, right side, initial encounter for closed fracture: Secondary | ICD-10-CM | POA: Diagnosis not present

## 2023-02-25 DIAGNOSIS — Z1152 Encounter for screening for COVID-19: Secondary | ICD-10-CM | POA: Diagnosis not present

## 2023-02-25 DIAGNOSIS — M5136 Other intervertebral disc degeneration, lumbar region: Secondary | ICD-10-CM | POA: Diagnosis not present

## 2023-02-25 DIAGNOSIS — S40011A Contusion of right shoulder, initial encounter: Secondary | ICD-10-CM | POA: Diagnosis not present

## 2023-02-25 DIAGNOSIS — E870 Hyperosmolality and hypernatremia: Secondary | ICD-10-CM | POA: Diagnosis not present

## 2023-02-25 DIAGNOSIS — R64 Cachexia: Secondary | ICD-10-CM | POA: Diagnosis not present

## 2023-02-25 DIAGNOSIS — M6281 Muscle weakness (generalized): Secondary | ICD-10-CM | POA: Diagnosis not present

## 2023-02-25 DIAGNOSIS — M47812 Spondylosis without myelopathy or radiculopathy, cervical region: Secondary | ICD-10-CM | POA: Diagnosis not present

## 2023-02-25 DIAGNOSIS — R6339 Other feeding difficulties: Secondary | ICD-10-CM | POA: Diagnosis not present

## 2023-02-25 DIAGNOSIS — N1832 Chronic kidney disease, stage 3b: Secondary | ICD-10-CM | POA: Diagnosis not present

## 2023-02-25 DIAGNOSIS — S0181XA Laceration without foreign body of other part of head, initial encounter: Secondary | ICD-10-CM | POA: Diagnosis not present

## 2023-02-25 DIAGNOSIS — F418 Other specified anxiety disorders: Secondary | ICD-10-CM | POA: Diagnosis not present

## 2023-02-25 DIAGNOSIS — I10 Essential (primary) hypertension: Secondary | ICD-10-CM | POA: Diagnosis not present

## 2023-02-25 DIAGNOSIS — S02841D Fracture of lateral orbital wall, right side, subsequent encounter for fracture with routine healing: Secondary | ICD-10-CM | POA: Diagnosis not present

## 2023-02-25 DIAGNOSIS — E039 Hypothyroidism, unspecified: Secondary | ICD-10-CM | POA: Diagnosis not present

## 2023-02-25 DIAGNOSIS — R279 Unspecified lack of coordination: Secondary | ICD-10-CM | POA: Diagnosis not present

## 2023-02-25 DIAGNOSIS — W19XXXA Unspecified fall, initial encounter: Secondary | ICD-10-CM | POA: Diagnosis not present

## 2023-02-25 DIAGNOSIS — G9341 Metabolic encephalopathy: Secondary | ICD-10-CM | POA: Diagnosis not present

## 2023-02-25 DIAGNOSIS — S0240CD Maxillary fracture, right side, subsequent encounter for fracture with routine healing: Secondary | ICD-10-CM | POA: Diagnosis not present

## 2023-02-25 DIAGNOSIS — M160 Bilateral primary osteoarthritis of hip: Secondary | ICD-10-CM | POA: Diagnosis not present

## 2023-02-25 DIAGNOSIS — R1312 Dysphagia, oropharyngeal phase: Secondary | ICD-10-CM | POA: Diagnosis not present

## 2023-02-25 DIAGNOSIS — W06XXXA Fall from bed, initial encounter: Secondary | ICD-10-CM | POA: Diagnosis not present

## 2023-02-25 DIAGNOSIS — G319 Degenerative disease of nervous system, unspecified: Secondary | ICD-10-CM | POA: Diagnosis not present

## 2023-02-25 DIAGNOSIS — R4189 Other symptoms and signs involving cognitive functions and awareness: Secondary | ICD-10-CM | POA: Diagnosis not present

## 2023-02-25 DIAGNOSIS — Z79890 Hormone replacement therapy (postmenopausal): Secondary | ICD-10-CM | POA: Diagnosis not present

## 2023-02-25 DIAGNOSIS — R54 Age-related physical debility: Secondary | ICD-10-CM | POA: Diagnosis not present

## 2023-02-25 DIAGNOSIS — F323 Major depressive disorder, single episode, severe with psychotic features: Secondary | ICD-10-CM | POA: Diagnosis not present

## 2023-02-25 DIAGNOSIS — F32A Depression, unspecified: Secondary | ICD-10-CM | POA: Diagnosis not present

## 2023-02-25 DIAGNOSIS — G9340 Encephalopathy, unspecified: Secondary | ICD-10-CM | POA: Diagnosis not present

## 2023-02-25 DIAGNOSIS — F0390 Unspecified dementia without behavioral disturbance: Secondary | ICD-10-CM | POA: Diagnosis not present

## 2023-02-25 DIAGNOSIS — I129 Hypertensive chronic kidney disease with stage 1 through stage 4 chronic kidney disease, or unspecified chronic kidney disease: Secondary | ICD-10-CM | POA: Diagnosis not present

## 2023-02-25 DIAGNOSIS — R5381 Other malaise: Secondary | ICD-10-CM | POA: Diagnosis not present

## 2023-02-25 DIAGNOSIS — W19XXXD Unspecified fall, subsequent encounter: Secondary | ICD-10-CM | POA: Diagnosis not present

## 2023-02-25 DIAGNOSIS — T148XXD Other injury of unspecified body region, subsequent encounter: Secondary | ICD-10-CM | POA: Diagnosis not present

## 2023-02-25 DIAGNOSIS — R339 Retention of urine, unspecified: Secondary | ICD-10-CM | POA: Diagnosis not present

## 2023-02-25 DIAGNOSIS — E440 Moderate protein-calorie malnutrition: Secondary | ICD-10-CM | POA: Diagnosis not present

## 2023-02-25 DIAGNOSIS — R197 Diarrhea, unspecified: Secondary | ICD-10-CM | POA: Diagnosis not present

## 2023-02-25 DIAGNOSIS — Z79899 Other long term (current) drug therapy: Secondary | ICD-10-CM | POA: Diagnosis not present

## 2023-02-25 DIAGNOSIS — M19011 Primary osteoarthritis, right shoulder: Secondary | ICD-10-CM | POA: Diagnosis not present

## 2023-02-25 DIAGNOSIS — S0180XA Unspecified open wound of other part of head, initial encounter: Secondary | ICD-10-CM | POA: Diagnosis not present

## 2023-02-25 DIAGNOSIS — R2681 Unsteadiness on feet: Secondary | ICD-10-CM | POA: Diagnosis not present

## 2023-02-25 DIAGNOSIS — G3184 Mild cognitive impairment, so stated: Secondary | ICD-10-CM | POA: Diagnosis not present

## 2023-02-25 DIAGNOSIS — G909 Disorder of the autonomic nervous system, unspecified: Secondary | ICD-10-CM | POA: Diagnosis not present

## 2023-02-25 DIAGNOSIS — N179 Acute kidney failure, unspecified: Secondary | ICD-10-CM | POA: Diagnosis not present

## 2023-02-25 DIAGNOSIS — F5082 Avoidant/restrictive food intake disorder: Secondary | ICD-10-CM | POA: Diagnosis not present

## 2023-02-25 DIAGNOSIS — R410 Disorientation, unspecified: Secondary | ICD-10-CM | POA: Diagnosis not present

## 2023-02-25 DIAGNOSIS — F419 Anxiety disorder, unspecified: Secondary | ICD-10-CM | POA: Diagnosis not present

## 2023-02-25 DIAGNOSIS — I700 Atherosclerosis of aorta: Secondary | ICD-10-CM | POA: Diagnosis not present

## 2023-02-25 DIAGNOSIS — S299XXA Unspecified injury of thorax, initial encounter: Secondary | ICD-10-CM | POA: Diagnosis not present

## 2023-02-25 DIAGNOSIS — M25551 Pain in right hip: Secondary | ICD-10-CM | POA: Diagnosis not present

## 2023-02-25 DIAGNOSIS — E876 Hypokalemia: Secondary | ICD-10-CM | POA: Diagnosis not present

## 2023-02-25 DIAGNOSIS — N183 Chronic kidney disease, stage 3 unspecified: Secondary | ICD-10-CM | POA: Diagnosis not present

## 2023-02-25 DIAGNOSIS — F0392 Unspecified dementia, unspecified severity, with psychotic disturbance: Secondary | ICD-10-CM | POA: Diagnosis not present

## 2023-02-25 DIAGNOSIS — R627 Adult failure to thrive: Secondary | ICD-10-CM | POA: Diagnosis not present

## 2023-02-25 DIAGNOSIS — E860 Dehydration: Secondary | ICD-10-CM | POA: Diagnosis not present

## 2023-02-25 DIAGNOSIS — F05 Delirium due to known physiological condition: Secondary | ICD-10-CM | POA: Diagnosis not present

## 2023-02-25 DIAGNOSIS — E878 Other disorders of electrolyte and fluid balance, not elsewhere classified: Secondary | ICD-10-CM | POA: Diagnosis not present

## 2023-02-25 DIAGNOSIS — J309 Allergic rhinitis, unspecified: Secondary | ICD-10-CM | POA: Diagnosis not present

## 2023-02-25 DIAGNOSIS — Z9181 History of falling: Secondary | ICD-10-CM | POA: Diagnosis not present

## 2023-02-25 DIAGNOSIS — S02841A Fracture of lateral orbital wall, right side, initial encounter for closed fracture: Secondary | ICD-10-CM | POA: Diagnosis not present

## 2023-02-25 DIAGNOSIS — S0231XD Fracture of orbital floor, right side, subsequent encounter for fracture with routine healing: Secondary | ICD-10-CM | POA: Diagnosis not present

## 2023-02-25 DIAGNOSIS — S02839A Fracture of medial orbital wall, unspecified side, initial encounter for closed fracture: Secondary | ICD-10-CM | POA: Diagnosis not present

## 2023-02-25 DIAGNOSIS — R451 Restlessness and agitation: Secondary | ICD-10-CM | POA: Diagnosis not present

## 2023-02-25 DIAGNOSIS — F329 Major depressive disorder, single episode, unspecified: Secondary | ICD-10-CM | POA: Diagnosis not present

## 2023-02-25 DIAGNOSIS — J690 Pneumonitis due to inhalation of food and vomit: Secondary | ICD-10-CM | POA: Diagnosis not present

## 2023-02-25 DIAGNOSIS — I951 Orthostatic hypotension: Secondary | ICD-10-CM | POA: Diagnosis not present

## 2023-02-27 DIAGNOSIS — G9340 Encephalopathy, unspecified: Secondary | ICD-10-CM | POA: Diagnosis not present

## 2023-02-28 DIAGNOSIS — G9340 Encephalopathy, unspecified: Secondary | ICD-10-CM | POA: Diagnosis not present

## 2023-03-08 DIAGNOSIS — I6782 Cerebral ischemia: Secondary | ICD-10-CM | POA: Diagnosis not present

## 2023-03-08 DIAGNOSIS — S02841D Fracture of lateral orbital wall, right side, subsequent encounter for fracture with routine healing: Secondary | ICD-10-CM | POA: Diagnosis not present

## 2023-03-08 DIAGNOSIS — Z880 Allergy status to penicillin: Secondary | ICD-10-CM | POA: Diagnosis not present

## 2023-03-08 DIAGNOSIS — E039 Hypothyroidism, unspecified: Secondary | ICD-10-CM | POA: Diagnosis not present

## 2023-03-08 DIAGNOSIS — R2681 Unsteadiness on feet: Secondary | ICD-10-CM | POA: Diagnosis not present

## 2023-03-08 DIAGNOSIS — F320 Major depressive disorder, single episode, mild: Secondary | ICD-10-CM | POA: Diagnosis not present

## 2023-03-08 DIAGNOSIS — R2689 Other abnormalities of gait and mobility: Secondary | ICD-10-CM | POA: Diagnosis not present

## 2023-03-08 DIAGNOSIS — S02842G Fracture of lateral orbital wall, left side, subsequent encounter for fracture with delayed healing: Secondary | ICD-10-CM | POA: Diagnosis not present

## 2023-03-08 DIAGNOSIS — Z043 Encounter for examination and observation following other accident: Secondary | ICD-10-CM | POA: Diagnosis not present

## 2023-03-08 DIAGNOSIS — W1830XA Fall on same level, unspecified, initial encounter: Secondary | ICD-10-CM | POA: Diagnosis present

## 2023-03-08 DIAGNOSIS — J40 Bronchitis, not specified as acute or chronic: Secondary | ICD-10-CM | POA: Diagnosis not present

## 2023-03-08 DIAGNOSIS — Z66 Do not resuscitate: Secondary | ICD-10-CM | POA: Diagnosis present

## 2023-03-08 DIAGNOSIS — E871 Hypo-osmolality and hyponatremia: Secondary | ICD-10-CM | POA: Diagnosis present

## 2023-03-08 DIAGNOSIS — M4184 Other forms of scoliosis, thoracic region: Secondary | ICD-10-CM | POA: Diagnosis not present

## 2023-03-08 DIAGNOSIS — F0284 Dementia in other diseases classified elsewhere, unspecified severity, with anxiety: Secondary | ICD-10-CM | POA: Diagnosis present

## 2023-03-08 DIAGNOSIS — G319 Degenerative disease of nervous system, unspecified: Secondary | ICD-10-CM | POA: Diagnosis not present

## 2023-03-08 DIAGNOSIS — F419 Anxiety disorder, unspecified: Secondary | ICD-10-CM | POA: Diagnosis not present

## 2023-03-08 DIAGNOSIS — M25522 Pain in left elbow: Secondary | ICD-10-CM | POA: Diagnosis not present

## 2023-03-08 DIAGNOSIS — G300 Alzheimer's disease with early onset: Secondary | ICD-10-CM | POA: Diagnosis present

## 2023-03-08 DIAGNOSIS — F32A Depression, unspecified: Secondary | ICD-10-CM | POA: Diagnosis not present

## 2023-03-08 DIAGNOSIS — M810 Age-related osteoporosis without current pathological fracture: Secondary | ICD-10-CM | POA: Diagnosis present

## 2023-03-08 DIAGNOSIS — S72012A Unspecified intracapsular fracture of left femur, initial encounter for closed fracture: Secondary | ICD-10-CM | POA: Diagnosis not present

## 2023-03-08 DIAGNOSIS — F0280 Dementia in other diseases classified elsewhere without behavioral disturbance: Secondary | ICD-10-CM | POA: Diagnosis not present

## 2023-03-08 DIAGNOSIS — R1312 Dysphagia, oropharyngeal phase: Secondary | ICD-10-CM | POA: Diagnosis not present

## 2023-03-08 DIAGNOSIS — R279 Unspecified lack of coordination: Secondary | ICD-10-CM | POA: Diagnosis not present

## 2023-03-08 DIAGNOSIS — G309 Alzheimer's disease, unspecified: Secondary | ICD-10-CM | POA: Diagnosis not present

## 2023-03-08 DIAGNOSIS — R5381 Other malaise: Secondary | ICD-10-CM | POA: Diagnosis not present

## 2023-03-08 DIAGNOSIS — Z79890 Hormone replacement therapy (postmenopausal): Secondary | ICD-10-CM | POA: Diagnosis not present

## 2023-03-08 DIAGNOSIS — R339 Retention of urine, unspecified: Secondary | ICD-10-CM | POA: Diagnosis not present

## 2023-03-08 DIAGNOSIS — D62 Acute posthemorrhagic anemia: Secondary | ICD-10-CM | POA: Diagnosis present

## 2023-03-08 DIAGNOSIS — W19XXXD Unspecified fall, subsequent encounter: Secondary | ICD-10-CM | POA: Diagnosis not present

## 2023-03-08 DIAGNOSIS — F05 Delirium due to known physiological condition: Secondary | ICD-10-CM | POA: Diagnosis present

## 2023-03-08 DIAGNOSIS — R1310 Dysphagia, unspecified: Secondary | ICD-10-CM | POA: Diagnosis present

## 2023-03-08 DIAGNOSIS — Z888 Allergy status to other drugs, medicaments and biological substances status: Secondary | ICD-10-CM | POA: Diagnosis not present

## 2023-03-08 DIAGNOSIS — M6281 Muscle weakness (generalized): Secondary | ICD-10-CM | POA: Diagnosis not present

## 2023-03-08 DIAGNOSIS — R296 Repeated falls: Secondary | ICD-10-CM | POA: Diagnosis present

## 2023-03-08 DIAGNOSIS — E876 Hypokalemia: Secondary | ICD-10-CM | POA: Diagnosis present

## 2023-03-08 DIAGNOSIS — S199XXA Unspecified injury of neck, initial encounter: Secondary | ICD-10-CM | POA: Diagnosis not present

## 2023-03-08 DIAGNOSIS — R54 Age-related physical debility: Secondary | ICD-10-CM | POA: Diagnosis not present

## 2023-03-08 DIAGNOSIS — I10 Essential (primary) hypertension: Secondary | ICD-10-CM | POA: Diagnosis not present

## 2023-03-08 DIAGNOSIS — R531 Weakness: Secondary | ICD-10-CM | POA: Diagnosis not present

## 2023-03-08 DIAGNOSIS — R410 Disorientation, unspecified: Secondary | ICD-10-CM | POA: Diagnosis not present

## 2023-03-08 DIAGNOSIS — S0990XA Unspecified injury of head, initial encounter: Secondary | ICD-10-CM | POA: Diagnosis not present

## 2023-03-08 DIAGNOSIS — E785 Hyperlipidemia, unspecified: Secondary | ICD-10-CM | POA: Diagnosis not present

## 2023-03-08 DIAGNOSIS — M47814 Spondylosis without myelopathy or radiculopathy, thoracic region: Secondary | ICD-10-CM | POA: Diagnosis not present

## 2023-03-08 DIAGNOSIS — S72002A Fracture of unspecified part of neck of left femur, initial encounter for closed fracture: Secondary | ICD-10-CM | POA: Diagnosis not present

## 2023-03-08 DIAGNOSIS — Z7982 Long term (current) use of aspirin: Secondary | ICD-10-CM | POA: Diagnosis not present

## 2023-03-08 DIAGNOSIS — F0283 Dementia in other diseases classified elsewhere, unspecified severity, with mood disturbance: Secondary | ICD-10-CM | POA: Diagnosis present

## 2023-03-08 DIAGNOSIS — I129 Hypertensive chronic kidney disease with stage 1 through stage 4 chronic kidney disease, or unspecified chronic kidney disease: Secondary | ICD-10-CM | POA: Diagnosis present

## 2023-03-08 DIAGNOSIS — N1831 Chronic kidney disease, stage 3a: Secondary | ICD-10-CM | POA: Diagnosis present

## 2023-03-08 DIAGNOSIS — J439 Emphysema, unspecified: Secondary | ICD-10-CM | POA: Diagnosis not present

## 2023-03-08 DIAGNOSIS — S0240CD Maxillary fracture, right side, subsequent encounter for fracture with routine healing: Secondary | ICD-10-CM | POA: Diagnosis not present

## 2023-03-08 DIAGNOSIS — Z79899 Other long term (current) drug therapy: Secondary | ICD-10-CM | POA: Diagnosis not present

## 2023-03-08 DIAGNOSIS — Z87891 Personal history of nicotine dependence: Secondary | ICD-10-CM | POA: Diagnosis not present

## 2023-03-08 DIAGNOSIS — Z9181 History of falling: Secondary | ICD-10-CM | POA: Diagnosis not present

## 2023-03-08 DIAGNOSIS — F418 Other specified anxiety disorders: Secondary | ICD-10-CM | POA: Diagnosis not present

## 2023-03-08 DIAGNOSIS — T7840XA Allergy, unspecified, initial encounter: Secondary | ICD-10-CM | POA: Diagnosis not present

## 2023-03-08 DIAGNOSIS — N183 Chronic kidney disease, stage 3 unspecified: Secondary | ICD-10-CM | POA: Diagnosis not present

## 2023-03-09 ENCOUNTER — Ambulatory Visit: Payer: Medicare Other

## 2023-03-09 ENCOUNTER — Ambulatory Visit: Payer: Medicare Other | Admitting: Physician Assistant

## 2023-03-09 DIAGNOSIS — S02841D Fracture of lateral orbital wall, right side, subsequent encounter for fracture with routine healing: Secondary | ICD-10-CM | POA: Diagnosis not present

## 2023-03-10 ENCOUNTER — Ambulatory Visit: Payer: Medicare Other

## 2023-03-10 ENCOUNTER — Ambulatory Visit: Payer: Medicare Other | Admitting: Physician Assistant

## 2023-03-11 DIAGNOSIS — M6281 Muscle weakness (generalized): Secondary | ICD-10-CM | POA: Diagnosis not present

## 2023-03-11 DIAGNOSIS — F32A Depression, unspecified: Secondary | ICD-10-CM | POA: Diagnosis not present

## 2023-03-11 DIAGNOSIS — S0240CD Maxillary fracture, right side, subsequent encounter for fracture with routine healing: Secondary | ICD-10-CM | POA: Diagnosis not present

## 2023-03-11 DIAGNOSIS — R2689 Other abnormalities of gait and mobility: Secondary | ICD-10-CM | POA: Diagnosis not present

## 2023-03-11 DIAGNOSIS — Z9181 History of falling: Secondary | ICD-10-CM | POA: Diagnosis not present

## 2023-03-11 DIAGNOSIS — R2681 Unsteadiness on feet: Secondary | ICD-10-CM | POA: Diagnosis not present

## 2023-03-11 DIAGNOSIS — S02842G Fracture of lateral orbital wall, left side, subsequent encounter for fracture with delayed healing: Secondary | ICD-10-CM | POA: Diagnosis not present

## 2023-03-12 DIAGNOSIS — N183 Chronic kidney disease, stage 3 unspecified: Secondary | ICD-10-CM | POA: Diagnosis not present

## 2023-03-12 DIAGNOSIS — F32A Depression, unspecified: Secondary | ICD-10-CM | POA: Diagnosis not present

## 2023-03-12 DIAGNOSIS — E039 Hypothyroidism, unspecified: Secondary | ICD-10-CM | POA: Diagnosis not present

## 2023-03-17 ENCOUNTER — Other Ambulatory Visit: Payer: Self-pay | Admitting: *Deleted

## 2023-03-17 NOTE — Patient Outreach (Signed)
Kimberly Frye resides in [facility]. Screening for potential care coordination services as benefit of health plan and Primary Care Provider.   Collaboration with Kimberly Frye [facility] psychotherapist. Kimberly Frye reports she has care plan meeting scheduled for Friday. Kimberly Frye previously resided at home alone.   Will continue to follow for potential care coordination needs and transition plans.   Raiford Noble, MSN, RN,BSN Post Acute Care Coordinator [phone] (Direct dial)

## 2023-03-18 ENCOUNTER — Ambulatory Visit: Payer: Medicare Other | Admitting: Physician Assistant

## 2023-03-18 ENCOUNTER — Ambulatory Visit (INDEPENDENT_AMBULATORY_CARE_PROVIDER_SITE_OTHER): Payer: Medicare Other | Admitting: Physician Assistant

## 2023-03-18 ENCOUNTER — Encounter: Payer: Self-pay | Admitting: Physician Assistant

## 2023-03-18 VITALS — BP 97/57 | HR 76 | Resp 20 | Wt 97.0 lb

## 2023-03-18 DIAGNOSIS — F32A Depression, unspecified: Secondary | ICD-10-CM | POA: Diagnosis not present

## 2023-03-18 DIAGNOSIS — R2681 Unsteadiness on feet: Secondary | ICD-10-CM | POA: Diagnosis not present

## 2023-03-18 DIAGNOSIS — G309 Alzheimer's disease, unspecified: Secondary | ICD-10-CM

## 2023-03-18 DIAGNOSIS — F0280 Dementia in other diseases classified elsewhere without behavioral disturbance: Secondary | ICD-10-CM

## 2023-03-18 DIAGNOSIS — S0240CD Maxillary fracture, right side, subsequent encounter for fracture with routine healing: Secondary | ICD-10-CM | POA: Diagnosis not present

## 2023-03-18 DIAGNOSIS — S02842G Fracture of lateral orbital wall, left side, subsequent encounter for fracture with delayed healing: Secondary | ICD-10-CM | POA: Diagnosis not present

## 2023-03-18 DIAGNOSIS — Z9181 History of falling: Secondary | ICD-10-CM | POA: Diagnosis not present

## 2023-03-18 DIAGNOSIS — R2689 Other abnormalities of gait and mobility: Secondary | ICD-10-CM | POA: Diagnosis not present

## 2023-03-18 DIAGNOSIS — M6281 Muscle weakness (generalized): Secondary | ICD-10-CM | POA: Diagnosis not present

## 2023-03-18 NOTE — Progress Notes (Addendum)
Assessment/Plan:    The patient is seen in neurologic consultation at the request of Fatima Sanger, FNP for the evaluation of memory.  Kimberly Frye is a delightful 89 y.o. year old RH female with a history of hypertension, hyperlipidemia, CKD 3, hypothyroidism, history of orthostatic hypotension, tobacco user, anxiety, depression with psychotic symptoms, who about 20 years ago may have had a diagnosis of memory impairment, recently seen at the ED with acute encephalopathy (02/25/2023).  Until recently, she was very independent of her ADLs, was driving, however, since June, as reported by family, she has shown significant cognitive decline.  In addition, she had episodes of stool incontinence, is at risk for falls, and swallow difficulties, no improvement after speech therapy.  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  Given her advanced disease, the risks of initiating antidementia medication would outweigh the benefits of it.  This was explained in detail to her family, agreeing with the plan.  Patient is living in ALF, but family is very involved, and she has 24/7 monitoring at this time.  Memory care facility would be beneficial to her, for increase socialization and physical activity.  Currently, her mood is stable.  No hallucinations or paranoia reported.  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  Recommend good control of cardiovascular risk factors.   Continue to control mood as per PCP No other dementia medication is indicated, as above Family would like to return for follow-up in 6 months although no active cognitive therapy will be provided. Continue 24/7 monitoring for  safety, recommend memory care.   Subjective:    The patient is accompanied by her son and her nonbiological daughter and son who supplement  the history.    How long did patient have memory difficulties?  "Around Christmas when she was having issues with writing checks, and filling out Christmas cards.   She is more confused than beforeIn June, when changes began to be more pronounced.  Patient has difficulty remembering recent conversations and people names. She had issues with appointments, having gone to the doctor on the wrong date.  She had trouble reading the calendar.  Since falling and hitting her head her memory changed, son says.  Later, on questioning, her daughter reports that about 20 years ago, she had been evaluated for memory impairment although charting documentation is not available for review.  It does not appear that she had any antidementia medications at the time. Repeats oneself?  Endorsed, just "kind of normal age related"-daughter says.  Disoriented when walking into a room?  Denies  leaving objects in unusual places?   Denies, although may have misplaced eyeliner and keys, etc.  Wandering behavior? Denies.   Any personality changes ? Denies. .   Any history of depression?: denies   Hallucinations or paranoia?  Denies.   Seizures? Denies.    Any sleep changes?  She sleeps well. Denies  vivid dreams, REM behavior or sleepwalking   Sleep apnea? denies   Any hygiene concerns?  Denies. Not recently.  Independent of bathing and dressing? Needs assistance Does the patient need help with medications? Facility is in charge   Who is in charge of the finances? Son  is in charge  Any changes in appetite?   Denies.  "If she does not like it she wont eat it "    Patient have trouble swallowing? Denies  Does the patient cook?  No   Any headaches?  Denies.   Chronic back pain?  Denies.   Ambulates with difficulty? Needs a walker to ambulate, today she is in wheelchair due to  fall risk. She is on PT. is unclear if she has difficulty thinking of the steps. Recent falls or head injuries?  Endorsed, recently she sustained a laceration on the right brow after falling. Vision changes? Denies  Stroke like symptoms?  Denies.   Any tremors?  Denies.   Any anosmia?  Denies.   Any incontinence of urine?  Intermittently.   Any bowel dysfunction? Intermittent diarrhea, unclear if there is a sphincter issue, "forgetting how to move her bowels rather than having diarrhea..  Patient lives at  ILF with assistance and PT  History of heavy alcohol intake? Denies.   History of heavy tobacco use? Denies.   Family history of dementia? Dementia  Does patient drive? No longer drives  CT of the head without contrast, August/15/2024   No intracranial hemorrhage or evidence of an acute infarction. Patchy diminished white matter attenuation consistent with chronic small vessel ischemic changes. Carotid atherosclerosis. No intracranial mass or mass effect. Ventricles and cisterns are unremarkable.   MRI of the brain, personally for without acute intracranial abnormalities,  cerebral atrophy and moderate chronic small vessel ischemic disease  Labs 02/2023 B12 635  Allergies  Allergen Reactions   Bystolic [Nebivolol Hcl] Swelling    Leg edema   Celexa [Citalopram Hydrobromide] Other (See Comments)    "In a daze"   Penicillins Other (See Comments)    Unknown reaction    Current Outpatient Medications  Medication Instructions   amLODipine (NORVASC) 2.5 mg, Oral, Daily   cetirizine (ZYRTEC ALLERGY) 10 mg, Oral, Daily PRN   diclofenac Sodium (VOLTAREN) 2 g, Topical, Every 6 hours PRN   sertraline (ZOLOFT) 100 mg, Oral, Daily   Synthroid 75 mcg, Oral, Daily     VITALS:   Vitals:   03/18/23 1427  BP: (!) 97/57  Pulse: 76  Resp: 20  SpO2: 98%  Weight: 97 lb (44 kg)       No data to display          PHYSICAL EXAM   HEENT:  Normocephalic, R over the eyebrow stitches.  Temporal wasting noted. T The superficial temporal arteries are without ropiness or tenderness. Cardiovascular: Regular rate and rhythm. Lungs: Clear to auscultation bilaterally. Neck: There are no carotid bruits noted bilaterally.  NEUROLOGICAL:    03/18/2023    4:00 PM  Montreal Cognitive Assessment   Visuospatial/ Executive (0/5) 1  Naming (0/3) 1  Attention: Read list of digits (0/2) 2  Attention: Read list of letters (0/1) 0  Attention: Serial 7 subtraction starting at 100 (0/3) 0  Language: Repeat phrase (0/2) 0  Language : Fluency (0/1) 0  Abstraction (0/2) 0  Delayed Recall (0/5) 0  Orientation (0/6) 4  Total 8  Adjusted Score (based on education) 8        No data to display           Orientation:  Alert and oriented to person, not to place and time. No aphasia or dysarthria. Fund of knowledge is reduced. Recent and remote memory impaired.  Attention and concentration are reduced.  Able to name objects and unable to repeat phrases.  Delayed recall 0/5    Cranial nerves: There is good facial symmetry. Extraocular muscles are intact and visual fields are full to confrontational testing. Speech is fluent and clear, no tongue deviation. Hearing is intact to conversational tone.  Tone: Tone is good throughout. Sensation: Sensation is intact to light touch  Coordination: The patient has  difficulty with RAM's or FNF on the L. Abnormal l finger to nose on the L  Motor: Strength is 5/5 in the bilateral upper and lower extremities. There is no pronator drift. There are no fasciculations noted. DTR's:  2/4B Gait and Station:  Significant debility on ambulation, needs assistance, no ataxia, short stride, narrow gait. Has to be sitting in wheelchair quickly due to debility.      Thank you for allowing [place] the opportunity to participate in the care of this nice patient. Please do not hesitate to contact us for any questions or concerns.   Total time spent on today's visit was 51  minutes dedicated to this patient today, preparing to see patient, examining the patient, ordering tests and/or medications and counseling the patient, documenting clinical information in the EHR or other health record, independently interpreting results and communicating results to the patient/family, discussing treatment and goals, answering patient's questions and coordinating care.  Cc:  Fatima Sanger, FNP  Marlowe Kays 03/18/2023 5:01 PM

## 2023-03-19 ENCOUNTER — Ambulatory Visit: Payer: Medicare Other

## 2023-03-19 ENCOUNTER — Ambulatory Visit: Payer: Medicare Other | Admitting: Physician Assistant

## 2023-03-19 ENCOUNTER — Telehealth: Payer: Self-pay | Admitting: Physician Assistant

## 2023-03-19 NOTE — Telephone Encounter (Signed)
Pt daughter called stating that she could read the first line on the AVS , and was wanting to know what the DX was

## 2023-03-19 NOTE — Telephone Encounter (Signed)
Unable to speak to patients daughter as there is no DPR or POA on file.

## 2023-03-22 DIAGNOSIS — S02841D Fracture of lateral orbital wall, right side, subsequent encounter for fracture with routine healing: Secondary | ICD-10-CM | POA: Diagnosis not present

## 2023-03-22 DIAGNOSIS — S0240CD Maxillary fracture, right side, subsequent encounter for fracture with routine healing: Secondary | ICD-10-CM | POA: Diagnosis not present

## 2023-03-22 DIAGNOSIS — S02842G Fracture of lateral orbital wall, left side, subsequent encounter for fracture with delayed healing: Secondary | ICD-10-CM | POA: Diagnosis not present

## 2023-03-22 DIAGNOSIS — R2689 Other abnormalities of gait and mobility: Secondary | ICD-10-CM | POA: Diagnosis not present

## 2023-03-22 DIAGNOSIS — R2681 Unsteadiness on feet: Secondary | ICD-10-CM | POA: Diagnosis not present

## 2023-03-22 DIAGNOSIS — M6281 Muscle weakness (generalized): Secondary | ICD-10-CM | POA: Diagnosis not present

## 2023-03-22 DIAGNOSIS — E039 Hypothyroidism, unspecified: Secondary | ICD-10-CM | POA: Diagnosis not present

## 2023-03-22 DIAGNOSIS — F32A Depression, unspecified: Secondary | ICD-10-CM | POA: Diagnosis not present

## 2023-03-22 DIAGNOSIS — R531 Weakness: Secondary | ICD-10-CM | POA: Diagnosis not present

## 2023-03-22 DIAGNOSIS — I10 Essential (primary) hypertension: Secondary | ICD-10-CM | POA: Diagnosis not present

## 2023-03-22 DIAGNOSIS — Z9181 History of falling: Secondary | ICD-10-CM | POA: Diagnosis not present

## 2023-03-23 NOTE — Telephone Encounter (Signed)
They are getting logged in my chart for notes.

## 2023-03-25 DIAGNOSIS — S02842G Fracture of lateral orbital wall, left side, subsequent encounter for fracture with delayed healing: Secondary | ICD-10-CM | POA: Diagnosis not present

## 2023-03-25 DIAGNOSIS — R2689 Other abnormalities of gait and mobility: Secondary | ICD-10-CM | POA: Diagnosis not present

## 2023-03-25 DIAGNOSIS — F32A Depression, unspecified: Secondary | ICD-10-CM | POA: Diagnosis not present

## 2023-03-25 DIAGNOSIS — S0240CD Maxillary fracture, right side, subsequent encounter for fracture with routine healing: Secondary | ICD-10-CM | POA: Diagnosis not present

## 2023-03-25 DIAGNOSIS — M6281 Muscle weakness (generalized): Secondary | ICD-10-CM | POA: Diagnosis not present

## 2023-03-25 DIAGNOSIS — Z9181 History of falling: Secondary | ICD-10-CM | POA: Diagnosis not present

## 2023-03-25 DIAGNOSIS — R2681 Unsteadiness on feet: Secondary | ICD-10-CM | POA: Diagnosis not present

## 2023-03-29 DIAGNOSIS — I10 Essential (primary) hypertension: Secondary | ICD-10-CM | POA: Diagnosis not present

## 2023-03-29 DIAGNOSIS — S0240CD Maxillary fracture, right side, subsequent encounter for fracture with routine healing: Secondary | ICD-10-CM | POA: Diagnosis not present

## 2023-03-29 DIAGNOSIS — R2689 Other abnormalities of gait and mobility: Secondary | ICD-10-CM | POA: Diagnosis not present

## 2023-03-29 DIAGNOSIS — T7840XA Allergy, unspecified, initial encounter: Secondary | ICD-10-CM | POA: Diagnosis not present

## 2023-03-29 DIAGNOSIS — F320 Major depressive disorder, single episode, mild: Secondary | ICD-10-CM | POA: Diagnosis not present

## 2023-03-29 DIAGNOSIS — F32A Depression, unspecified: Secondary | ICD-10-CM | POA: Diagnosis not present

## 2023-03-29 DIAGNOSIS — Z9181 History of falling: Secondary | ICD-10-CM | POA: Diagnosis not present

## 2023-03-29 DIAGNOSIS — S02842G Fracture of lateral orbital wall, left side, subsequent encounter for fracture with delayed healing: Secondary | ICD-10-CM | POA: Diagnosis not present

## 2023-03-29 DIAGNOSIS — R2681 Unsteadiness on feet: Secondary | ICD-10-CM | POA: Diagnosis not present

## 2023-03-29 DIAGNOSIS — M6281 Muscle weakness (generalized): Secondary | ICD-10-CM | POA: Diagnosis not present

## 2023-03-29 DIAGNOSIS — S02841D Fracture of lateral orbital wall, right side, subsequent encounter for fracture with routine healing: Secondary | ICD-10-CM | POA: Diagnosis not present

## 2023-03-31 ENCOUNTER — Other Ambulatory Visit: Payer: Self-pay | Admitting: *Deleted

## 2023-03-31 NOTE — Patient Outreach (Signed)
Post- Acute Care Coordinator follow up. Ms. Brueggen resides in  [facility] skilled nursing facility.  Screening for potential care coordination/ chronic care management services as a benefit of health plan and primary care provider.  Collaboration with [facility] psychotherapist. Wilkie Aye reports transition plan is for ALF placement. Reports [facility] is coming today to assess.   No identifiable care coordination/chronic care management needs since dc plan is ALF.   Raiford Noble, MSN, RN, BSN [place]  [facility] RN Post- Acute Care Coordinator Direct Dial: [phone]

## 2023-04-01 ENCOUNTER — Ambulatory Visit: Payer: Medicare Other | Admitting: Neurology

## 2023-04-01 DIAGNOSIS — S0240CD Maxillary fracture, right side, subsequent encounter for fracture with routine healing: Secondary | ICD-10-CM | POA: Diagnosis not present

## 2023-04-01 DIAGNOSIS — S02842G Fracture of lateral orbital wall, left side, subsequent encounter for fracture with delayed healing: Secondary | ICD-10-CM | POA: Diagnosis not present

## 2023-04-01 DIAGNOSIS — M6281 Muscle weakness (generalized): Secondary | ICD-10-CM | POA: Diagnosis not present

## 2023-04-01 DIAGNOSIS — R2689 Other abnormalities of gait and mobility: Secondary | ICD-10-CM | POA: Diagnosis not present

## 2023-04-01 DIAGNOSIS — Z9181 History of falling: Secondary | ICD-10-CM | POA: Diagnosis not present

## 2023-04-01 DIAGNOSIS — F32A Depression, unspecified: Secondary | ICD-10-CM | POA: Diagnosis not present

## 2023-04-01 DIAGNOSIS — R2681 Unsteadiness on feet: Secondary | ICD-10-CM | POA: Diagnosis not present

## 2023-04-05 DIAGNOSIS — Z9181 History of falling: Secondary | ICD-10-CM | POA: Diagnosis not present

## 2023-04-05 DIAGNOSIS — S02842G Fracture of lateral orbital wall, left side, subsequent encounter for fracture with delayed healing: Secondary | ICD-10-CM | POA: Diagnosis not present

## 2023-04-05 DIAGNOSIS — R2689 Other abnormalities of gait and mobility: Secondary | ICD-10-CM | POA: Diagnosis not present

## 2023-04-05 DIAGNOSIS — S0240CD Maxillary fracture, right side, subsequent encounter for fracture with routine healing: Secondary | ICD-10-CM | POA: Diagnosis not present

## 2023-04-05 DIAGNOSIS — M6281 Muscle weakness (generalized): Secondary | ICD-10-CM | POA: Diagnosis not present

## 2023-04-05 DIAGNOSIS — F32A Depression, unspecified: Secondary | ICD-10-CM | POA: Diagnosis not present

## 2023-04-05 DIAGNOSIS — R2681 Unsteadiness on feet: Secondary | ICD-10-CM | POA: Diagnosis not present

## 2023-04-06 ENCOUNTER — Ambulatory Visit: Payer: Medicare Other | Admitting: Physician Assistant

## 2023-04-06 ENCOUNTER — Ambulatory Visit: Payer: Medicare Other

## 2023-04-07 ENCOUNTER — Ambulatory Visit: Payer: Medicare Other | Admitting: Physician Assistant

## 2023-04-07 ENCOUNTER — Ambulatory Visit: Payer: Medicare Other

## 2023-04-08 DIAGNOSIS — M6281 Muscle weakness (generalized): Secondary | ICD-10-CM | POA: Diagnosis not present

## 2023-04-08 DIAGNOSIS — S02842G Fracture of lateral orbital wall, left side, subsequent encounter for fracture with delayed healing: Secondary | ICD-10-CM | POA: Diagnosis not present

## 2023-04-08 DIAGNOSIS — F32A Depression, unspecified: Secondary | ICD-10-CM | POA: Diagnosis not present

## 2023-04-08 DIAGNOSIS — R2681 Unsteadiness on feet: Secondary | ICD-10-CM | POA: Diagnosis not present

## 2023-04-08 DIAGNOSIS — S0240CD Maxillary fracture, right side, subsequent encounter for fracture with routine healing: Secondary | ICD-10-CM | POA: Diagnosis not present

## 2023-04-08 DIAGNOSIS — R2689 Other abnormalities of gait and mobility: Secondary | ICD-10-CM | POA: Diagnosis not present

## 2023-04-08 DIAGNOSIS — Z9181 History of falling: Secondary | ICD-10-CM | POA: Diagnosis not present

## 2023-04-09 ENCOUNTER — Inpatient Hospital Stay (HOSPITAL_BASED_OUTPATIENT_CLINIC_OR_DEPARTMENT_OTHER)
Admission: EM | Admit: 2023-04-09 | Discharge: 2023-04-14 | DRG: 522 | Disposition: A | Discharge status: Skilled Nursing Facility | Payer: Medicare Other | Source: Skilled Nursing Facility | Attending: Internal Medicine | Admitting: Internal Medicine

## 2023-04-09 ENCOUNTER — Encounter (HOSPITAL_BASED_OUTPATIENT_CLINIC_OR_DEPARTMENT_OTHER): Payer: Self-pay

## 2023-04-09 ENCOUNTER — Emergency Department (HOSPITAL_BASED_OUTPATIENT_CLINIC_OR_DEPARTMENT_OTHER): Payer: Medicare Other

## 2023-04-09 ENCOUNTER — Emergency Department (HOSPITAL_BASED_OUTPATIENT_CLINIC_OR_DEPARTMENT_OTHER): Payer: Medicare Other | Admitting: Radiology

## 2023-04-09 ENCOUNTER — Other Ambulatory Visit: Payer: Self-pay

## 2023-04-09 DIAGNOSIS — Z888 Allergy status to other drugs, medicaments and biological substances status: Secondary | ICD-10-CM | POA: Diagnosis not present

## 2023-04-09 DIAGNOSIS — S72009A Fracture of unspecified part of neck of unspecified femur, initial encounter for closed fracture: Secondary | ICD-10-CM | POA: Diagnosis present

## 2023-04-09 DIAGNOSIS — Z79890 Hormone replacement therapy (postmenopausal): Secondary | ICD-10-CM

## 2023-04-09 DIAGNOSIS — R1310 Dysphagia, unspecified: Secondary | ICD-10-CM | POA: Diagnosis present

## 2023-04-09 DIAGNOSIS — F0284 Dementia in other diseases classified elsewhere, unspecified severity, with anxiety: Secondary | ICD-10-CM | POA: Diagnosis present

## 2023-04-09 DIAGNOSIS — E039 Hypothyroidism, unspecified: Secondary | ICD-10-CM | POA: Diagnosis not present

## 2023-04-09 DIAGNOSIS — W1830XA Fall on same level, unspecified, initial encounter: Secondary | ICD-10-CM | POA: Diagnosis present

## 2023-04-09 DIAGNOSIS — R296 Repeated falls: Secondary | ICD-10-CM | POA: Diagnosis present

## 2023-04-09 DIAGNOSIS — S7290XA Unspecified fracture of unspecified femur, initial encounter for closed fracture: Secondary | ICD-10-CM | POA: Diagnosis present

## 2023-04-09 DIAGNOSIS — D62 Acute posthemorrhagic anemia: Secondary | ICD-10-CM | POA: Diagnosis present

## 2023-04-09 DIAGNOSIS — Z043 Encounter for examination and observation following other accident: Secondary | ICD-10-CM | POA: Diagnosis not present

## 2023-04-09 DIAGNOSIS — G300 Alzheimer's disease with early onset: Secondary | ICD-10-CM | POA: Diagnosis present

## 2023-04-09 DIAGNOSIS — M4184 Other forms of scoliosis, thoracic region: Secondary | ICD-10-CM | POA: Diagnosis not present

## 2023-04-09 DIAGNOSIS — Z880 Allergy status to penicillin: Secondary | ICD-10-CM | POA: Diagnosis not present

## 2023-04-09 DIAGNOSIS — R531 Weakness: Secondary | ICD-10-CM | POA: Diagnosis not present

## 2023-04-09 DIAGNOSIS — M47814 Spondylosis without myelopathy or radiculopathy, thoracic region: Secondary | ICD-10-CM | POA: Diagnosis not present

## 2023-04-09 DIAGNOSIS — J40 Bronchitis, not specified as acute or chronic: Secondary | ICD-10-CM | POA: Diagnosis not present

## 2023-04-09 DIAGNOSIS — S72002A Fracture of unspecified part of neck of left femur, initial encounter for closed fracture: Principal | ICD-10-CM

## 2023-04-09 DIAGNOSIS — I10 Essential (primary) hypertension: Secondary | ICD-10-CM | POA: Diagnosis not present

## 2023-04-09 DIAGNOSIS — Z471 Aftercare following joint replacement surgery: Secondary | ICD-10-CM | POA: Diagnosis present

## 2023-04-09 DIAGNOSIS — M25522 Pain in left elbow: Secondary | ICD-10-CM | POA: Diagnosis not present

## 2023-04-09 DIAGNOSIS — R41841 Cognitive communication deficit: Secondary | ICD-10-CM | POA: Diagnosis present

## 2023-04-09 DIAGNOSIS — R262 Difficulty in walking, not elsewhere classified: Secondary | ICD-10-CM | POA: Diagnosis present

## 2023-04-09 DIAGNOSIS — I129 Hypertensive chronic kidney disease with stage 1 through stage 4 chronic kidney disease, or unspecified chronic kidney disease: Secondary | ICD-10-CM | POA: Diagnosis present

## 2023-04-09 DIAGNOSIS — Z7982 Long term (current) use of aspirin: Secondary | ICD-10-CM | POA: Diagnosis not present

## 2023-04-09 DIAGNOSIS — G319 Degenerative disease of nervous system, unspecified: Secondary | ICD-10-CM | POA: Diagnosis not present

## 2023-04-09 DIAGNOSIS — M810 Age-related osteoporosis without current pathological fracture: Secondary | ICD-10-CM | POA: Diagnosis present

## 2023-04-09 DIAGNOSIS — E785 Hyperlipidemia, unspecified: Secondary | ICD-10-CM | POA: Diagnosis present

## 2023-04-09 DIAGNOSIS — J439 Emphysema, unspecified: Secondary | ICD-10-CM | POA: Diagnosis present

## 2023-04-09 DIAGNOSIS — E871 Hypo-osmolality and hyponatremia: Secondary | ICD-10-CM | POA: Diagnosis present

## 2023-04-09 DIAGNOSIS — Z79899 Other long term (current) drug therapy: Secondary | ICD-10-CM | POA: Diagnosis not present

## 2023-04-09 DIAGNOSIS — S72012A Unspecified intracapsular fracture of left femur, initial encounter for closed fracture: Principal | ICD-10-CM | POA: Diagnosis present

## 2023-04-09 DIAGNOSIS — Z743 Need for continuous supervision: Secondary | ICD-10-CM | POA: Diagnosis not present

## 2023-04-09 DIAGNOSIS — F320 Major depressive disorder, single episode, mild: Secondary | ICD-10-CM | POA: Diagnosis not present

## 2023-04-09 DIAGNOSIS — F0283 Dementia in other diseases classified elsewhere, unspecified severity, with mood disturbance: Secondary | ICD-10-CM | POA: Diagnosis present

## 2023-04-09 DIAGNOSIS — F05 Delirium due to known physiological condition: Secondary | ICD-10-CM | POA: Diagnosis present

## 2023-04-09 DIAGNOSIS — N1831 Chronic kidney disease, stage 3a: Secondary | ICD-10-CM | POA: Diagnosis present

## 2023-04-09 DIAGNOSIS — E876 Hypokalemia: Secondary | ICD-10-CM | POA: Diagnosis present

## 2023-04-09 DIAGNOSIS — S0990XA Unspecified injury of head, initial encounter: Secondary | ICD-10-CM | POA: Diagnosis not present

## 2023-04-09 DIAGNOSIS — Z66 Do not resuscitate: Secondary | ICD-10-CM | POA: Diagnosis present

## 2023-04-09 DIAGNOSIS — Z87891 Personal history of nicotine dependence: Secondary | ICD-10-CM | POA: Diagnosis not present

## 2023-04-09 DIAGNOSIS — Z96642 Presence of left artificial hip joint: Secondary | ICD-10-CM | POA: Diagnosis present

## 2023-04-09 DIAGNOSIS — I6782 Cerebral ischemia: Secondary | ICD-10-CM | POA: Diagnosis not present

## 2023-04-09 DIAGNOSIS — S72002D Fracture of unspecified part of neck of left femur, subsequent encounter for closed fracture with routine healing: Secondary | ICD-10-CM | POA: Diagnosis present

## 2023-04-09 DIAGNOSIS — S199XXA Unspecified injury of neck, initial encounter: Secondary | ICD-10-CM | POA: Diagnosis not present

## 2023-04-09 DIAGNOSIS — M6281 Muscle weakness (generalized): Secondary | ICD-10-CM | POA: Diagnosis present

## 2023-04-09 DIAGNOSIS — Z751 Person awaiting admission to adequate facility elsewhere: Secondary | ICD-10-CM

## 2023-04-09 HISTORY — DX: Abnormal findings on diagnostic imaging of other parts of digestive tract: R93.3

## 2023-04-09 LAB — CBC WITH DIFFERENTIAL/PLATELET
Abs Immature Granulocytes: 0.05 10*3/uL (ref 0.00–0.07)
Basophils Absolute: 0 10*3/uL (ref 0.0–0.1)
Basophils Relative: 0 %
Eosinophils Absolute: 0.2 10*3/uL (ref 0.0–0.5)
Eosinophils Relative: 2 %
HCT: 33.9 % — ABNORMAL LOW (ref 36.0–46.0)
Hemoglobin: 11.4 g/dL — ABNORMAL LOW (ref 12.0–15.0)
Immature Granulocytes: 1 %
Lymphocytes Relative: 10 %
Lymphs Abs: 1.1 10*3/uL (ref 0.7–4.0)
MCH: 29.2 pg (ref 26.0–34.0)
MCHC: 33.6 g/dL (ref 30.0–36.0)
MCV: 86.7 fL (ref 80.0–100.0)
Monocytes Absolute: 0.9 10*3/uL (ref 0.1–1.0)
Monocytes Relative: 9 %
Neutro Abs: 8.4 10*3/uL — ABNORMAL HIGH (ref 1.7–7.7)
Neutrophils Relative %: 78 %
Platelets: 249 10*3/uL (ref 150–400)
RBC: 3.91 MIL/uL (ref 3.87–5.11)
RDW: 13.6 % (ref 11.5–15.5)
WBC: 10.7 10*3/uL — ABNORMAL HIGH (ref 4.0–10.5)
nRBC: 0 % (ref 0.0–0.2)

## 2023-04-09 LAB — CK: Total CK: 83 U/L (ref 38–234)

## 2023-04-09 LAB — COMPREHENSIVE METABOLIC PANEL
ALT: 16 U/L (ref 0–44)
AST: 23 U/L (ref 15–41)
Albumin: 4.4 g/dL (ref 3.5–5.0)
Alkaline Phosphatase: 78 U/L (ref 38–126)
Anion gap: 13 (ref 5–15)
BUN: 32 mg/dL — ABNORMAL HIGH (ref 8–23)
CO2: 24 mmol/L (ref 22–32)
Calcium: 10 mg/dL (ref 8.9–10.3)
Chloride: 99 mmol/L (ref 98–111)
Creatinine, Ser: 0.96 mg/dL (ref 0.44–1.00)
GFR, Estimated: 57 mL/min — ABNORMAL LOW (ref >60)
Glucose, Bld: 108 mg/dL — ABNORMAL HIGH (ref 70–99)
Potassium: 4.2 mmol/L (ref 3.5–5.1)
Sodium: 136 mmol/L (ref 135–145)
Total Bilirubin: 0.7 mg/dL (ref 0.3–1.2)
Total Protein: 8 g/dL (ref 6.5–8.1)

## 2023-04-09 MED ORDER — ACETAMINOPHEN 500 MG PO TABS
500.0000 mg | ORAL_TABLET | Freq: Once | ORAL | Status: AC
  Administered 2023-04-09: 500 mg via ORAL
  Filled 2023-04-09: qty 1

## 2023-04-09 MED ORDER — FENTANYL CITRATE PF 50 MCG/ML IJ SOSY: 12.5000 ug | PREFILLED_SYRINGE | Freq: Once | INTRAMUSCULAR | Status: DC

## 2023-04-09 NOTE — ED Provider Notes (Signed)
[place] EMERGENCY DEPARTMENT AT [facility] Provider Note   CSN: [id number] Arrival date & time: 04/09/23  1651     History {Add pertinent medical, surgical, social history, OB history to HPI:1} Chief Complaint  Patient presents with   Hip Pain    Kimberly Frye is a 89 y.o. female.  HPI     89yo female with history of hypertension, hyperlipidemia, CKD, hypothyroidism, orthostatic hypotension, dementia likely mixed alzheimer and vascular   Found on the floor this morning 6AM, similar delrium. Did not remember what happened  Discharged [facility] after this fall  Home Medications Prior to Admission medications   Medication Sig Start Date End Date Taking? Authorizing Provider  amLODipine (NORVASC) 2.5 MG tablet Take 1 tablet (2.5 mg total) by mouth daily. Patient taking differently: Take 5 mg by mouth daily. 12/26/22   Cathleen Corti, MD  cetirizine (ZYRTEC ALLERGY) 10 MG tablet Take 10 mg by mouth daily as needed for allergies. 01/27/19   [provider]  diclofenac Sodium (VOLTAREN) 1 % GEL Apply 2 g topically every 6 (six) hours as needed.    [provider]  sertraline (ZOLOFT) 100 MG tablet Take 100 mg by mouth daily.    [provider]  SYNTHROID 75 MCG tablet Take 75 mcg by mouth daily. 09/02/20   [provider]      Allergies    Bystolic [nebivolol hcl], Celexa [citalopram hydrobromide], and Penicillins    Review of Systems   Review of Systems  Physical Exam Updated Vital Signs BP (!) 172/68   Pulse 75   Temp 98 F (36.7 C)   Resp 18   Ht 5\' 3"  (1.6 m)   Wt 45.4 kg   SpO2 98%   BMI 17.71 kg/m  Physical Exam  ED Results / Procedures / Treatments   Labs (all labs ordered are listed, but only abnormal results are displayed) Labs Reviewed - No data to display  EKG None  Radiology DG Hip Unilat With Pelvis 2-3 Views Left  Result Date: 04/09/2023 CLINICAL DATA:  Fall, left hip pain. EXAM: DG  HIP (WITH OR WITHOUT PELVIS) 2-3V LEFT COMPARISON:  None Available. FINDINGS: There is a subcapital impaction fracture of the proximal left femur. The remaining bony structures are intact and there is no dislocation. Vascular calcifications are noted in the pelvis and lower extremities bilaterally. Degenerative changes are present at the right hip and lumbar spine. IMPRESSION: Subcapital impaction fracture of the proximal left femur. Electronically Signed   By: Thornell Sartorius M.D.   On: 04/09/2023 20:34    Procedures Procedures  {Document cardiac monitor, telemetry assessment procedure when appropriate:1}  Medications Ordered in ED Medications - No data to display  ED Course/ Medical Decision Making/ A&P   {   Click here for ABCD2, HEART and other calculatorsREFRESH Note before signing :1}                              Medical Decision Making Amount and/or Complexity of Data Reviewed Labs: ordered. Radiology: ordered.  Risk OTC drugs. Prescription drug management.   ***   Dr. Kasandra Knudsen [facility] {Document critical care time when appropriate:1} {Document review of labs and clinical decision tools ie heart score, Chads2Vasc2 etc:1}  {Document your independent review of radiology images, and any outside records:1} {Document your discussion with family members, caretakers, and with consultants:1} {Document social determinants of health affecting pt's care:1} {Document your  decision making why or why not admission, treatments were needed:1} Final Clinical Impression(s) / ED Diagnoses Final diagnoses:  None    Rx / DC Orders ED Discharge Orders     None

## 2023-04-09 NOTE — Plan of Care (Signed)
Plan of Care Note for accepted transfer   Patient name: LATANA COLIN ONG:[id number] DOB: Mar 26, 1933  Facility requesting transfer: [facility] Requesting Provider: Dr. Dalene Seltzer Reason for transfer: Hip fracture Facility course: 89 year old female with history of anxiety, CKD stage IIIb, depression, hypertension, hypothyroidism, dementia presented to ED with left hip pain after an unwitnessed fall at her nursing facility.  Slightly hypertensive but vital signs otherwise stable.  Labs showing WBC 10.7, hemoglobin 11.4 (stable), BUN 32, creatinine 0.9, CK normal.  X-ray showing subcapital impaction fracture of the proximal left femur.  CT head/C-spine/T-spine, x-ray of left elbow, and chest x-ray negative for acute traumatic injuries.  Patient was given Tylenol and fentanyl.  EDP discussed the case with Dr. August Saucer with orthopedics, recommended admission at [facility] and keeping n.p.o. after midnight.  Orthopedics will consult.  Plan of care: The patient is accepted for admission to Med-surg  unit at [facility].  [facility] will assume care on arrival to accepting facility. Until arrival, care as per EDP. However, [facility] available 24/7 for questions and assistance.  Author: John Giovanni, MD 02/15/2023  Check [email or web address] for on-call coverage.  Nursing staff, please call [facility] Admits & Consults System-Wide number under Amion on patient's arrival so appropriate admitting provider can evaluate the pt.

## 2023-04-09 NOTE — ED Triage Notes (Signed)
In for eval of fall at approx 0700, found on floor at rehab. Was told by staff they did a ROM test but no xray. Left hip pain. Left elbow skin tear.

## 2023-04-09 NOTE — ED Notes (Signed)
Pt. C/o 0 pain without movement. Did not c/o any pain when I removed her jeans for xray. Pt. Has a bruise to left hip

## 2023-04-10 ENCOUNTER — Inpatient Hospital Stay (HOSPITAL_COMMUNITY): Payer: Medicare Other | Admitting: Anesthesiology

## 2023-04-10 ENCOUNTER — Inpatient Hospital Stay (HOSPITAL_COMMUNITY): Payer: Medicare Other

## 2023-04-10 ENCOUNTER — Encounter (HOSPITAL_COMMUNITY)
Admission: EM | Disposition: A | Discharge status: Skilled Nursing Facility | Payer: Self-pay | Source: Skilled Nursing Facility | Attending: Family Medicine

## 2023-04-10 ENCOUNTER — Other Ambulatory Visit: Payer: Self-pay

## 2023-04-10 ENCOUNTER — Encounter (HOSPITAL_COMMUNITY): Payer: Self-pay | Admitting: Internal Medicine

## 2023-04-10 DIAGNOSIS — M810 Age-related osteoporosis without current pathological fracture: Secondary | ICD-10-CM | POA: Diagnosis present

## 2023-04-10 DIAGNOSIS — S72002D Fracture of unspecified part of neck of left femur, subsequent encounter for closed fracture with routine healing: Secondary | ICD-10-CM | POA: Diagnosis present

## 2023-04-10 DIAGNOSIS — R41841 Cognitive communication deficit: Secondary | ICD-10-CM | POA: Diagnosis present

## 2023-04-10 DIAGNOSIS — I129 Hypertensive chronic kidney disease with stage 1 through stage 4 chronic kidney disease, or unspecified chronic kidney disease: Secondary | ICD-10-CM | POA: Diagnosis present

## 2023-04-10 DIAGNOSIS — R296 Repeated falls: Secondary | ICD-10-CM | POA: Diagnosis present

## 2023-04-10 DIAGNOSIS — E039 Hypothyroidism, unspecified: Secondary | ICD-10-CM | POA: Diagnosis present

## 2023-04-10 DIAGNOSIS — Z471 Aftercare following joint replacement surgery: Secondary | ICD-10-CM | POA: Diagnosis not present

## 2023-04-10 DIAGNOSIS — S72012A Unspecified intracapsular fracture of left femur, initial encounter for closed fracture: Secondary | ICD-10-CM | POA: Diagnosis present

## 2023-04-10 DIAGNOSIS — Z888 Allergy status to other drugs, medicaments and biological substances status: Secondary | ICD-10-CM | POA: Diagnosis not present

## 2023-04-10 DIAGNOSIS — S72002A Fracture of unspecified part of neck of left femur, initial encounter for closed fracture: Secondary | ICD-10-CM

## 2023-04-10 DIAGNOSIS — J439 Emphysema, unspecified: Secondary | ICD-10-CM | POA: Diagnosis present

## 2023-04-10 DIAGNOSIS — M6281 Muscle weakness (generalized): Secondary | ICD-10-CM | POA: Diagnosis present

## 2023-04-10 DIAGNOSIS — S7290XA Unspecified fracture of unspecified femur, initial encounter for closed fracture: Secondary | ICD-10-CM | POA: Diagnosis present

## 2023-04-10 DIAGNOSIS — G300 Alzheimer's disease with early onset: Secondary | ICD-10-CM | POA: Diagnosis present

## 2023-04-10 DIAGNOSIS — R1310 Dysphagia, unspecified: Secondary | ICD-10-CM | POA: Diagnosis present

## 2023-04-10 DIAGNOSIS — Z79890 Hormone replacement therapy (postmenopausal): Secondary | ICD-10-CM | POA: Diagnosis not present

## 2023-04-10 DIAGNOSIS — F0284 Dementia in other diseases classified elsewhere, unspecified severity, with anxiety: Secondary | ICD-10-CM | POA: Diagnosis present

## 2023-04-10 DIAGNOSIS — S72009A Fracture of unspecified part of neck of unspecified femur, initial encounter for closed fracture: Secondary | ICD-10-CM | POA: Diagnosis not present

## 2023-04-10 DIAGNOSIS — Z96642 Presence of left artificial hip joint: Secondary | ICD-10-CM | POA: Diagnosis not present

## 2023-04-10 DIAGNOSIS — D62 Acute posthemorrhagic anemia: Secondary | ICD-10-CM | POA: Diagnosis present

## 2023-04-10 DIAGNOSIS — R262 Difficulty in walking, not elsewhere classified: Secondary | ICD-10-CM | POA: Diagnosis present

## 2023-04-10 DIAGNOSIS — N1831 Chronic kidney disease, stage 3a: Secondary | ICD-10-CM | POA: Diagnosis present

## 2023-04-10 DIAGNOSIS — E876 Hypokalemia: Secondary | ICD-10-CM | POA: Diagnosis present

## 2023-04-10 DIAGNOSIS — Z66 Do not resuscitate: Secondary | ICD-10-CM | POA: Diagnosis present

## 2023-04-10 DIAGNOSIS — E785 Hyperlipidemia, unspecified: Secondary | ICD-10-CM | POA: Diagnosis present

## 2023-04-10 DIAGNOSIS — E871 Hypo-osmolality and hyponatremia: Secondary | ICD-10-CM | POA: Diagnosis present

## 2023-04-10 DIAGNOSIS — Z7982 Long term (current) use of aspirin: Secondary | ICD-10-CM | POA: Diagnosis not present

## 2023-04-10 DIAGNOSIS — Z87891 Personal history of nicotine dependence: Secondary | ICD-10-CM | POA: Diagnosis not present

## 2023-04-10 DIAGNOSIS — F0283 Dementia in other diseases classified elsewhere, unspecified severity, with mood disturbance: Secondary | ICD-10-CM | POA: Diagnosis present

## 2023-04-10 DIAGNOSIS — W1830XA Fall on same level, unspecified, initial encounter: Secondary | ICD-10-CM | POA: Diagnosis present

## 2023-04-10 DIAGNOSIS — Z79899 Other long term (current) drug therapy: Secondary | ICD-10-CM | POA: Diagnosis not present

## 2023-04-10 DIAGNOSIS — Z880 Allergy status to penicillin: Secondary | ICD-10-CM | POA: Diagnosis not present

## 2023-04-10 DIAGNOSIS — F05 Delirium due to known physiological condition: Secondary | ICD-10-CM | POA: Diagnosis present

## 2023-04-10 DIAGNOSIS — Z743 Need for continuous supervision: Secondary | ICD-10-CM | POA: Diagnosis not present

## 2023-04-10 HISTORY — PX: TOTAL HIP ARTHROPLASTY: SHX124

## 2023-04-10 LAB — CBC
HCT: 34.4 % — ABNORMAL LOW (ref 36.0–46.0)
Hemoglobin: 11.3 g/dL — ABNORMAL LOW (ref 12.0–15.0)
MCH: 29.5 pg (ref 26.0–34.0)
MCHC: 32.8 g/dL (ref 30.0–36.0)
MCV: 89.8 fL (ref 80.0–100.0)
Platelets: 260 10*3/uL (ref 150–400)
RBC: 3.83 MIL/uL — ABNORMAL LOW (ref 3.87–5.11)
RDW: 13.8 % (ref 11.5–15.5)
WBC: 9.7 10*3/uL (ref 4.0–10.5)
nRBC: 0 % (ref 0.0–0.2)

## 2023-04-10 LAB — TYPE AND SCREEN
ABO/RH(D): O POS
Antibody Screen: NEGATIVE

## 2023-04-10 LAB — BASIC METABOLIC PANEL
Anion gap: 10 (ref 5–15)
BUN: 29 mg/dL — ABNORMAL HIGH (ref 8–23)
CO2: 26 mmol/L (ref 22–32)
Calcium: 9.5 mg/dL (ref 8.9–10.3)
Chloride: 100 mmol/L (ref 98–111)
Creatinine, Ser: 0.89 mg/dL (ref 0.44–1.00)
GFR, Estimated: >60 mL/min (ref >60)
Glucose, Bld: 103 mg/dL — ABNORMAL HIGH (ref 70–99)
Potassium: 3.3 mmol/L — ABNORMAL LOW (ref 3.5–5.1)
Sodium: 136 mmol/L (ref 135–145)

## 2023-04-10 LAB — PHOSPHORUS: Phosphorus: 2.4 mg/dL — ABNORMAL LOW (ref 2.5–4.6)

## 2023-04-10 LAB — ABO/RH: ABO/RH(D): O POS

## 2023-04-10 LAB — VITAMIN D 25 HYDROXY (VIT D DEFICIENCY, FRACTURES): Vit D, 25-Hydroxy: 23.87 ng/mL — ABNORMAL LOW (ref 30–100)

## 2023-04-10 LAB — SURGICAL PCR SCREEN
MRSA, PCR: NEGATIVE
Staphylococcus aureus: NEGATIVE

## 2023-04-10 LAB — MAGNESIUM: Magnesium: 1.6 mg/dL — ABNORMAL LOW (ref 1.7–2.4)

## 2023-04-10 SURGERY — ARTHROPLASTY, HIP, TOTAL, ANTERIOR APPROACH
Anesthesia: Monitor Anesthesia Care | Site: Hip | Laterality: Left

## 2023-04-10 MED ORDER — POVIDONE-IODINE 10 % EX SWAB
2.0000 | Freq: Once | CUTANEOUS | Status: AC
  Administered 2023-04-10: 2 via TOPICAL

## 2023-04-10 MED ORDER — ACETAMINOPHEN 500 MG PO TABS
1000.0000 mg | ORAL_TABLET | Freq: Four times a day (QID) | ORAL | Status: DC | PRN
  Administered 2023-04-10 – 2023-04-13 (×4): 1000 mg via ORAL
  Filled 2023-04-10 (×5): qty 2

## 2023-04-10 MED ORDER — IRRISEPT - 450ML BOTTLE WITH 0.05% CHG IN STERILE WATER, USP 99.95% OPTIME
TOPICAL | Status: DC | PRN
  Administered 2023-04-10: 450 mL via TOPICAL

## 2023-04-10 MED ORDER — ONDANSETRON HCL 4 MG/2ML IJ SOLN: 4.0000 mg | Freq: Four times a day (QID) | INTRAMUSCULAR | Status: DC | PRN

## 2023-04-10 MED ORDER — VANCOMYCIN HCL 1000 MG IV SOLR
INTRAVENOUS | Status: AC
  Filled 2023-04-10: qty 20

## 2023-04-10 MED ORDER — ACETAMINOPHEN 500 MG PO TABS: 1000.0000 mg | ORAL_TABLET | Freq: Once | ORAL | Status: DC | PRN

## 2023-04-10 MED ORDER — CLONIDINE HCL (ANALGESIA) 100 MCG/ML EP SOLN
EPIDURAL | Status: AC
  Filled 2023-04-10: qty 10

## 2023-04-10 MED ORDER — FENTANYL CITRATE (PF) 250 MCG/5ML IJ SOLN
INTRAMUSCULAR | Status: AC
  Filled 2023-04-10: qty 5

## 2023-04-10 MED ORDER — ALBUMIN HUMAN 5 % IV SOLN
INTRAVENOUS | Status: DC | PRN
Start: 2023-04-10 — End: 2023-04-10

## 2023-04-10 MED ORDER — DOCUSATE SODIUM 100 MG PO CAPS
100.0000 mg | ORAL_CAPSULE | Freq: Two times a day (BID) | ORAL | Status: DC
  Administered 2023-04-10 – 2023-04-14 (×8): 100 mg via ORAL
  Filled 2023-04-10 (×8): qty 1

## 2023-04-10 MED ORDER — CEFAZOLIN SODIUM-DEXTROSE 2-4 GM/100ML-% IV SOLN
INTRAVENOUS | Status: AC
  Filled 2023-04-10: qty 100

## 2023-04-10 MED ORDER — LEVOTHYROXINE SODIUM 75 MCG PO TABS
75.0000 ug | ORAL_TABLET | Freq: Every day | ORAL | Status: DC
  Administered 2023-04-11 – 2023-04-14 (×4): 75 ug via ORAL
  Filled 2023-04-10 (×5): qty 1

## 2023-04-10 MED ORDER — CHLORHEXIDINE GLUCONATE 0.12 % MT SOLN
OROMUCOSAL | Status: AC
  Administered 2023-04-10: 15 mL via OROMUCOSAL
  Filled 2023-04-10: qty 15

## 2023-04-10 MED ORDER — BUPIVACAINE LIPOSOME 1.3 % IJ SUSP
INTRAMUSCULAR | Status: AC
  Filled 2023-04-10: qty 20

## 2023-04-10 MED ORDER — MORPHINE SULFATE (PF) 4 MG/ML IV SOLN
INTRAVENOUS | Status: AC
  Filled 2023-04-10: qty 1

## 2023-04-10 MED ORDER — CALCIUM CARBONATE 1250 (500 CA) MG PO TABS
1.0000 | ORAL_TABLET | Freq: Two times a day (BID) | ORAL | Status: DC
  Administered 2023-04-10 – 2023-04-14 (×8): 1250 mg via ORAL
  Filled 2023-04-10 (×8): qty 1

## 2023-04-10 MED ORDER — VANCOMYCIN HCL 1000 MG IV SOLR
INTRAVENOUS | Status: DC | PRN
  Administered 2023-04-10: 1000 mg via TOPICAL

## 2023-04-10 MED ORDER — LACTATED RINGERS IV SOLN: INTRAVENOUS | Status: DC | PRN

## 2023-04-10 MED ORDER — FENTANYL CITRATE (PF) 250 MCG/5ML IJ SOLN
INTRAMUSCULAR | Status: DC | PRN
  Administered 2023-04-10: 25 ug via INTRAVENOUS

## 2023-04-10 MED ORDER — METOCLOPRAMIDE HCL 5 MG/ML IJ SOLN: 5.0000 mg | Freq: Three times a day (TID) | INTRAMUSCULAR | Status: DC | PRN

## 2023-04-10 MED ORDER — CHLORHEXIDINE GLUCONATE 0.12 % MT SOLN: 15.0000 mL | Freq: Once | OROMUCOSAL | Status: AC

## 2023-04-10 MED ORDER — POTASSIUM PHOSPHATES 15 MMOLE/5ML IV SOLN
30.0000 mmol | Freq: Once | INTRAVENOUS | Status: DC
  Administered 2023-04-10: 30 mmol via INTRAVENOUS
  Filled 2023-04-10: qty 10

## 2023-04-10 MED ORDER — ACETAMINOPHEN 10 MG/ML IV SOLN: 1000.0000 mg | Freq: Once | INTRAVENOUS | Status: DC | PRN

## 2023-04-10 MED ORDER — SERTRALINE HCL 100 MG PO TABS
100.0000 mg | ORAL_TABLET | Freq: Every day | ORAL | Status: DC
  Administered 2023-04-11 – 2023-04-14 (×4): 100 mg via ORAL
  Filled 2023-04-10 (×4): qty 1

## 2023-04-10 MED ORDER — PROPOFOL 10 MG/ML IV BOLUS
INTRAVENOUS | Status: AC
  Filled 2023-04-10: qty 20

## 2023-04-10 MED ORDER — PHENYLEPHRINE 80 MCG/ML (10ML) SYRINGE FOR IV PUSH (FOR BLOOD PRESSURE SUPPORT)
PREFILLED_SYRINGE | INTRAVENOUS | Status: DC | PRN
  Administered 2023-04-10: 80 ug via INTRAVENOUS
  Administered 2023-04-10: 40 ug via INTRAVENOUS

## 2023-04-10 MED ORDER — PROPOFOL 500 MG/50ML IV EMUL
INTRAVENOUS | Status: DC | PRN
  Administered 2023-04-10: 50 ug/kg/min via INTRAVENOUS

## 2023-04-10 MED ORDER — FENTANYL CITRATE (PF) 100 MCG/2ML IJ SOLN: 25.0000 ug | INTRAMUSCULAR | Status: DC | PRN

## 2023-04-10 MED ORDER — ONDANSETRON HCL 4 MG PO TABS: 4.0000 mg | ORAL_TABLET | Freq: Four times a day (QID) | ORAL | Status: DC | PRN

## 2023-04-10 MED ORDER — 0.9 % SODIUM CHLORIDE (POUR BTL) OPTIME
TOPICAL | Status: DC | PRN
  Administered 2023-04-10: 1000 mL
  Administered 2023-04-10: 2000 mL

## 2023-04-10 MED ORDER — CEFAZOLIN SODIUM-DEXTROSE 2-4 GM/100ML-% IV SOLN: 2.0000 g | INTRAVENOUS | Status: DC

## 2023-04-10 MED ORDER — MENTHOL 3 MG MT LOZG: 1.0000 | LOZENGE | OROMUCOSAL | Status: DC | PRN

## 2023-04-10 MED ORDER — IPRATROPIUM-ALBUTEROL 0.5-2.5 (3) MG/3ML IN SOLN: 3.0000 mL | Freq: Four times a day (QID) | RESPIRATORY_TRACT | Status: DC | PRN

## 2023-04-10 MED ORDER — TRANEXAMIC ACID 1000 MG/10ML IV SOLN
INTRAVENOUS | Status: DC | PRN
  Administered 2023-04-10: 1000 mg via INTRAVENOUS

## 2023-04-10 MED ORDER — ASPIRIN 81 MG PO TBEC
81.0000 mg | DELAYED_RELEASE_TABLET | Freq: Two times a day (BID) | ORAL | Status: DC
  Administered 2023-04-10 – 2023-04-14 (×8): 81 mg via ORAL
  Filled 2023-04-10 (×8): qty 1

## 2023-04-10 MED ORDER — BUPIVACAINE HCL (PF) 0.25 % IJ SOLN
INTRAMUSCULAR | Status: AC
  Filled 2023-04-10: qty 30

## 2023-04-10 MED ORDER — ACETAMINOPHEN 160 MG/5ML PO SOLN: 1000.0000 mg | Freq: Once | ORAL | Status: DC | PRN

## 2023-04-10 MED ORDER — MAGNESIUM SULFATE 2 GM/50ML IV SOLN
2.0000 g | Freq: Once | INTRAVENOUS | Status: AC
  Administered 2023-04-10: 2 g via INTRAVENOUS
  Filled 2023-04-10: qty 50

## 2023-04-10 MED ORDER — MORPHINE SULFATE 4 MG/ML IJ SOLN
INTRAMUSCULAR | Status: DC | PRN
  Administered 2023-04-10: 11.5 mL

## 2023-04-10 MED ORDER — PROPOFOL 10 MG/ML IV BOLUS
INTRAVENOUS | Status: DC | PRN
Start: 2023-04-10 — End: 2023-04-10
  Administered 2023-04-10: 30 mg via INTRAVENOUS

## 2023-04-10 MED ORDER — CEFAZOLIN SODIUM-DEXTROSE 2-4 GM/100ML-% IV SOLN
2.0000 g | Freq: Four times a day (QID) | INTRAVENOUS | Status: AC
  Administered 2023-04-10 (×2): 2 g via INTRAVENOUS
  Filled 2023-04-10 (×2): qty 100

## 2023-04-10 MED ORDER — TRANEXAMIC ACID-NACL 1000-0.7 MG/100ML-% IV SOLN
INTRAVENOUS | Status: AC
  Filled 2023-04-10: qty 100

## 2023-04-10 MED ORDER — SODIUM CHLORIDE (PF) 0.9 % IJ SOLN
INTRAMUSCULAR | Status: DC | PRN
  Administered 2023-04-10: 60 mL via INTRA_ARTICULAR

## 2023-04-10 MED ORDER — METOCLOPRAMIDE HCL 5 MG PO TABS: 5.0000 mg | ORAL_TABLET | Freq: Three times a day (TID) | ORAL | Status: DC | PRN

## 2023-04-10 MED ORDER — PHENOL 1.4 % MT LIQD: 1.0000 | OROMUCOSAL | Status: DC | PRN

## 2023-04-10 MED ORDER — ORAL CARE MOUTH RINSE: 15.0000 mL | Freq: Once | OROMUCOSAL | Status: AC

## 2023-04-10 MED ORDER — SUCCINYLCHOLINE CHLORIDE 200 MG/10ML IV SOSY
PREFILLED_SYRINGE | INTRAVENOUS | Status: AC
  Filled 2023-04-10: qty 10

## 2023-04-10 MED ORDER — ROCURONIUM BROMIDE 10 MG/ML (PF) SYRINGE
PREFILLED_SYRINGE | INTRAVENOUS | Status: AC
  Filled 2023-04-10: qty 10

## 2023-04-10 MED ORDER — VITAMIN D 25 MCG (1000 UNIT) PO TABS
1000.0000 [IU] | ORAL_TABLET | Freq: Every day | ORAL | Status: DC
  Administered 2023-04-11 – 2023-04-14 (×4): 1000 [IU] via ORAL
  Filled 2023-04-10 (×4): qty 1

## 2023-04-10 MED ORDER — SODIUM CHLORIDE 0.9% FLUSH
3.0000 mL | Freq: Two times a day (BID) | INTRAVENOUS | Status: DC
  Administered 2023-04-10 – 2023-04-14 (×9): 3 mL via INTRAVENOUS

## 2023-04-10 MED ORDER — TRANEXAMIC ACID-NACL 1000-0.7 MG/100ML-% IV SOLN
1000.0000 mg | Freq: Once | INTRAVENOUS | Status: AC
  Administered 2023-04-10: 1000 mg via INTRAVENOUS
  Filled 2023-04-10: qty 100

## 2023-04-10 MED ORDER — LIDOCAINE 2% (20 MG/ML) 5 ML SYRINGE
INTRAMUSCULAR | Status: AC
  Filled 2023-04-10: qty 5

## 2023-04-10 MED ORDER — LACTATED RINGERS IV SOLN: INTRAVENOUS | Status: DC

## 2023-04-10 MED ORDER — BUPIVACAINE IN DEXTROSE 0.75-8.25 % IT SOLN
INTRATHECAL | Status: DC | PRN
Start: 2023-04-10 — End: 2023-04-10
  Administered 2023-04-10: 1.6 mL via INTRATHECAL

## 2023-04-10 MED ORDER — SODIUM CHLORIDE 0.9 % IR SOLN
Status: DC | PRN
  Administered 2023-04-10: 3000 mL

## 2023-04-10 MED ORDER — CHLORHEXIDINE GLUCONATE 4 % EX SOLN: 60.0000 mL | Freq: Once | CUTANEOUS | Status: DC

## 2023-04-10 MED ORDER — OXYCODONE HCL 5 MG PO TABS
2.5000 mg | ORAL_TABLET | Freq: Four times a day (QID) | ORAL | Status: DC | PRN
  Administered 2023-04-11 – 2023-04-14 (×2): 2.5 mg via ORAL
  Filled 2023-04-10 (×2): qty 1

## 2023-04-10 MED ORDER — POTASSIUM CHLORIDE IN NACL 20-0.9 MEQ/L-% IV SOLN
INTRAVENOUS | Status: AC
  Filled 2023-04-10 (×2): qty 1000

## 2023-04-10 SURGICAL SUPPLY — 55 items
BAG COUNTER SPONGE SURGICOUNT (BAG) ×2 IMPLANT
BAG DECANTER FOR FLEXI CONT (MISCELLANEOUS) ×1 IMPLANT
BAG SPNG CNTER NS LX DISP (BAG) ×2
BLADE CLIPPER SURG (BLADE) ×1 IMPLANT
BLADE SAW SGTL 18X1.27X75 (BLADE) ×2 IMPLANT
COOLER ICEMAN CLASSIC (MISCELLANEOUS) ×1 IMPLANT
COVER PERINEAL POST (MISCELLANEOUS) ×2 IMPLANT
COVER SURGICAL LIGHT HANDLE (MISCELLANEOUS) ×2 IMPLANT
CUP SECTOR GRIPTON 50MM (Cup) ×1 IMPLANT
DRAPE C-ARM 42X72 X-RAY (DRAPES) ×2 IMPLANT
DRAPE STERI IOBAN 125X83 (DRAPES) ×2 IMPLANT
DRAPE U-SHAPE 47X51 STRL (DRAPES) ×6 IMPLANT
DRSG AQUACEL AG ADV 3.5X10 (GAUZE/BANDAGES/DRESSINGS) ×2 IMPLANT
DURAPREP 26ML APPLICATOR (WOUND CARE) ×2 IMPLANT
ELECT BLADE 4.0 EZ CLEAN MEGAD (MISCELLANEOUS) ×2
ELECT REM PT RETURN 9FT ADLT (ELECTROSURGICAL) ×2
ELECTRODE BLDE 4.0 EZ CLN MEGD (MISCELLANEOUS) ×2 IMPLANT
ELECTRODE REM PT RTRN 9FT ADLT (ELECTROSURGICAL) ×2 IMPLANT
GLOVE BIOGEL PI IND STRL 8 (GLOVE) ×2 IMPLANT
GLOVE ECLIPSE 8.0 STRL XLNG CF (GLOVE) ×4 IMPLANT
GOWN STRL REUS W/ TWL LRG LVL3 (GOWN DISPOSABLE) ×6 IMPLANT
GOWN STRL REUS W/TWL LRG LVL3 (GOWN DISPOSABLE) ×6
HANDPIECE INTERPULSE COAX TIP (DISPOSABLE) ×2
HEAD FEM STD 32X+5 STRL (Hips) ×1 IMPLANT
HOOD PEEL AWAY T7 (MISCELLANEOUS) ×6 IMPLANT
JET LAVAGE IRRISEPT WOUND (IRRIGATION / IRRIGATOR) ×2
KIT BASIN OR (CUSTOM PROCEDURE TRAY) ×2 IMPLANT
KIT TURNOVER KIT B (KITS) ×2 IMPLANT
LAVAGE JET IRRISEPT WOUND (IRRIGATION / IRRIGATOR) ×2 IMPLANT
LINER ACETABULAR 32X50 (Liner) ×1 IMPLANT
NDL HYPO 22X1.5 SAFETY MO (MISCELLANEOUS) ×1 IMPLANT
NEEDLE HYPO 22X1.5 SAFETY MO (MISCELLANEOUS) ×2 IMPLANT
NS IRRIG 1000ML POUR BTL (IV SOLUTION) ×4 IMPLANT
PACK TOTAL JOINT (CUSTOM PROCEDURE TRAY) ×2 IMPLANT
PAD ARMBOARD 7.5X6 YLW CONV (MISCELLANEOUS) ×3 IMPLANT
PAD COLD SHLDR WRAP-ON (PAD) ×1 IMPLANT
SCREW 6.5MMX25MM (Screw) ×1 IMPLANT
SET HNDPC FAN SPRY TIP SCT (DISPOSABLE) ×2 IMPLANT
STEM FEM ACTIS STD SZ4 (Stem) ×1 IMPLANT
STRIP CLOSURE SKIN 1/2X4 (GAUZE/BANDAGES/DRESSINGS) ×2 IMPLANT
SUT ETHIBOND NAB CT1 #1 30IN (SUTURE) ×4 IMPLANT
SUT ETHILON 3 0 PS 1 (SUTURE) ×1 IMPLANT
SUT MNCRL AB 3-0 PS2 18 (SUTURE) ×2 IMPLANT
SUT VIC AB 0 CT1 27 (SUTURE) ×4
SUT VIC AB 0 CT1 27XBRD ANBCTR (SUTURE) ×5 IMPLANT
SUT VIC AB 1 CT1 27 (SUTURE) ×12
SUT VIC AB 1 CT1 27XBRD ANBCTR (SUTURE) ×9 IMPLANT
SUT VIC AB 2-0 CT1 27 (SUTURE) ×4
SUT VIC AB 2-0 CT1 TAPERPNT 27 (SUTURE) ×4 IMPLANT
SYR 30ML LL (SYRINGE) ×3 IMPLANT
TOWEL GREEN STERILE (TOWEL DISPOSABLE) ×2 IMPLANT
TOWEL GREEN STERILE FF (TOWEL DISPOSABLE) ×2 IMPLANT
TRAY CATH INTERMITTENT SS 16FR (CATHETERS) ×1 IMPLANT
TRAY FOLEY MTR SLVR 16FR STAT (SET/KITS/TRAYS/PACK) ×1 IMPLANT
WATER STERILE IRR 1000ML POUR (IV SOLUTION) ×2 IMPLANT

## 2023-04-10 NOTE — Anesthesia Procedure Notes (Signed)
Spinal  Patient location during procedure: OR Start time: 04/10/2023 12:01 PM End time: 04/10/2023 12:07 PM Reason for block: surgical anesthesia Staffing Performed: anesthesiologist  Anesthesiologist: Val Eagle, MD Performed by: Val Eagle, MD Authorized by: Val Eagle, MD   Preanesthetic Checklist Completed: patient identified, IV checked, risks and benefits discussed, surgical consent, monitors and equipment checked, pre-op evaluation and timeout performed Spinal Block Patient position: right lateral decubitus Prep: DuraPrep Patient monitoring: heart rate, cardiac monitor, continuous pulse ox and blood pressure Approach: midline Location: L4-5 Injection technique: single-shot Needle Needle type: Pencan  Needle gauge: 24 G Needle length: 9 cm Assessment Sensory level: T6 Events: CSF return

## 2023-04-10 NOTE — Consult Note (Signed)
Reason for Consult: Left hip pain Referring Physician: Dr. Jeanmarie Frye is an 89 y.o. female.  HPI: Kimberly Frye is an 89 year old patient who is ambulatory who had a mechanical fall yesterday.  Reported left hip pain.  Noted to have displaced subcapital femoral neck fracture.  Presents now for operative management afterrisks and benefits.  Patient did have an earlier fall this year which required skilled nursing placement.  Head injury was the only problem at that time.  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  This all occurred in [place].  She is otherwise healthy with the exception of early Alzheimer's/memory problems.  She is very active walking around according to her son and daughter with whom I spoke for about 30 minutes this morning.  Past Medical History:  Diagnosis Date   Adult failure to thrive    Anxiety    Anxiety    Chronic kidney disease, stage 3 (HCC)    Depression    HTN (hypertension)    Hypothyroidism    Hypothyroidism    Retention of urine     Past Surgical History:  Procedure Laterality Date   ABDOMINAL HYSTERECTOMY      History reviewed. No pertinent family history.  Social History:  reports that she has never smoked. She has never been exposed to tobacco smoke. She has never used smokeless tobacco. She reports current alcohol use. She reports that she does not use drugs.  Allergies:  Allergies  Allergen Reactions   Bystolic [Nebivolol Hcl] Swelling    Leg edema   Celexa [Citalopram Hydrobromide] Other (See Comments)    "In a daze"   Penicillins Other (See Comments)    Unknown reaction    Medications: I have reviewed the patient's current medications.  Results for orders placed or performed during the hospital encounter of 04/09/23 (from the past 48 hour(s))  CBC with Differential     Status: Abnormal   Collection Time:  04/09/23  9:37 PM  Result Value Ref Range   WBC 10.7 (H) 4.0 - 10.5 K/uL   RBC 3.91 3.87 - 5.11 MIL/uL   Hemoglobin 11.4 (L) 12.0 - 15.0 g/dL   HCT 57.8 (L) 46.9 - 62.9 %   MCV 86.7 80.0 - 100.0 fL   MCH 29.2 26.0 - 34.0 pg   MCHC 33.6 30.0 - 36.0 g/dL   RDW 52.8 41.3 - 24.4 %   Platelets 249 150 - 400 K/uL   nRBC 0.0 0.0 - 0.2 %   Neutrophils Relative % 78 %   Neutro Abs 8.4 (H) 1.7 - 7.7 K/uL   Lymphocytes Relative 10 %   Lymphs Abs 1.1 0.7 - 4.0 K/uL   Monocytes Relative 9 %   Monocytes Absolute 0.9 0.1 - 1.0 K/uL   Eosinophils Relative 2 %   Eosinophils Absolute 0.2 0.0 - 0.5 K/uL   Basophils Relative 0 %   Basophils Absolute 0.0 0.0 - 0.1 K/uL   Immature Granulocytes 1 %   Abs Immature Granulocytes 0.05 0.00 - 0.07 K/uL    Comment: Performed at [facility], [address], [place], [place] [address]  Comprehensive metabolic panel     Status: Abnormal   Collection Time: 04/09/23  9:37 PM  Result Value Ref Range   Sodium 136 135 - 145 mmol/L   Potassium 4.2 3.5 - 5.1 mmol/L   Chloride 99 98 -  111 mmol/L   CO2 24 22 - 32 mmol/L   Glucose, Bld 108 (H) 70 - 99 mg/dL    Comment: Glucose reference range applies only to samples taken after fasting for at least 8 hours.   BUN 32 (H) 8 - 23 mg/dL   Creatinine, Ser 4.09 0.44 - 1.00 mg/dL   Calcium 81.1 8.9 - 91.4 mg/dL   Total Protein 8.0 6.5 - 8.1 g/dL   Albumin 4.4 3.5 - 5.0 g/dL   AST 23 15 - 41 U/L   ALT 16 0 - 44 U/L   Alkaline Phosphatase 78 38 - 126 U/L   Total Bilirubin 0.7 0.3 - 1.2 mg/dL   GFR, Estimated 57 (L) >60 mL/min    Comment: (NOTE) Calculated using the CKD-EPI Creatinine Equation (2021)    Anion gap 13 5 - 15    Comment: Performed at [facility], [address] [place], [place] [address]  CK     Status: None   Collection Time: 04/09/23  9:37 PM  Result Value Ref Range   Total CK 83 38 - 234 U/L    Comment: Performed at [facility], [address], [place], [place] [address]  Basic metabolic panel     Status: Abnormal   Collection Time: 04/10/23  3:57 AM  Result Value Ref Range   Sodium 136 135 - 145 mmol/L   Potassium 3.3 (L) 3.5 - 5.1 mmol/L   Chloride 100 98 - 111 mmol/L   CO2 26 22 - 32 mmol/L   Glucose, Bld 103 (H) 70 - 99 mg/dL    Comment: Glucose reference range applies only to samples taken after fasting for at least 8 hours.   BUN 29 (H) 8 - 23 mg/dL   Creatinine, Ser 8.65 0.44 - 1.00 mg/dL   Calcium 9.5 8.9 - 78.4 mg/dL   GFR, Estimated >69 >62 mL/min    Comment: (NOTE) Calculated using the CKD-EPI Creatinine Equation (2021)    Anion gap 10 5 - 15    Comment: Performed at [facility], [address], [place], [place] [address]  CBC     Status: Abnormal   Collection Time: 04/10/23  3:57 AM  Result Value Ref Range   WBC 9.7 4.0 - 10.5 K/uL   RBC 3.83 (L) 3.87 - 5.11 MIL/uL   Hemoglobin 11.3 (L) 12.0 - 15.0 g/dL   HCT 13.2 (L) 44.0 - 10.2 %   MCV 89.8 80.0 - 100.0 fL   MCH 29.5 26.0 - 34.0 pg   MCHC 32.8 30.0 - 36.0 g/dL   RDW 72.5 36.6 - 44.0 %   Platelets 260 150 - 400 K/uL   nRBC 0.0 0.0 - 0.2 %    Comment: Performed at [facility], [address] [place], [place] [address]  Magnesium     Status: Abnormal   Collection Time: 04/10/23  3:57 AM  Result Value Ref Range   Magnesium 1.6 (L) 1.7 - 2.4 mg/dL    Comment: Performed at [facility], [address], [place], [place] [address]  Phosphorus     Status: Abnormal   Collection Time: 04/10/23  3:57 AM  Result Value Ref Range   Phosphorus 2.4 (L) 2.5 - 4.6 mg/dL    Comment: Performed at [facility], [address], [place], [place] [address]  Type and screen [facility]     Status: None   Collection Time: 04/10/23  3:57  AM  Result Value Ref Range   ABO/RH(D) O POS    Antibody Screen NEG    Sample Expiration      04/13/2023,2359 Performed at [facility], [address], [place], [place]  [address]   VITAMIN D 25 Hydroxy (Vit-D Deficiency, Fractures)     Status: Abnormal   Collection Time: 04/10/23  3:57 AM  Result Value Ref Range   Vit D, 25-Hydroxy 23.87 (L) 30 - 100 ng/mL    Comment: (NOTE) Vitamin D deficiency has been defined by the Institute of Medicine  and an Endocrine Society practice guideline as a level of serum 25-OH  vitamin D less than 20 ng/mL (1,2). The Endocrine Society went on to  further define vitamin D insufficiency as a level between 21 and 29  ng/mL (2).  1. IOM (Institute of Medicine). 2010. Dietary reference intakes for  calcium and D. [place]: [employer]. 2. Holick MF, Binkley [place], Bischoff-Ferrari HA, et al. Evaluation,  treatment, and prevention of vitamin D deficiency: an Endocrine  Society clinical practice guideline, JCEM. 2011 Jul; 96(7): 1911-30.  Performed at [facility], [address], [place], [place] [address]   ABO/Rh     Status: None   Collection Time: 04/10/23  5:57 AM  Result Value Ref Range   ABO/RH(D)      O POS Performed at [facility], [address] [place], [place] [address]     CT THORACIC SPINE WO CONTRAST  Result Date: 04/09/2023 CLINICAL DATA:  Fall found on floor EXAM: CT THORACIC SPINE WITHOUT CONTRAST TECHNIQUE: Multidetector CT images of the thoracic were obtained using the standard protocol without intravenous contrast. RADIATION DOSE REDUCTION: This exam was performed according to the departmental dose-optimization program which includes automated exposure control, adjustment of the mA and/or kV according to patient size and/or use of iterative reconstruction technique. COMPARISON:  None Available. FINDINGS: Alignment: Scoliosis of the lower thoracic spine. Sagittal alignment is within normal limits. Vertebrae: Normal vertebral stature. No fracture. Lucent lesion in the T9 vertebral body with some coarse trabecula, potentially a hemangioma. Paraspinal and other soft tissues:  Emphysema. No acute paravertebral or paraspinal soft tissue abnormality. Aortic calcification and coronary vascular calcification. Disc levels: Multilevel degenerative osteophytes. IMPRESSION: 1. No CT evidence for acute osseous abnormality. 2. Scoliosis and degenerative change. Aortic Atherosclerosis (ICD10-I70.0) and Emphysema (ICD10-J43.9). Electronically Signed   By: Jasmine Pang M.D.   On: 04/09/2023 22:55   CT Head Wo Contrast  Result Date: 04/09/2023 CLINICAL DATA:  Head trauma fall EXAM: CT HEAD WITHOUT CONTRAST CT CERVICAL SPINE WITHOUT CONTRAST TECHNIQUE: Multidetector CT imaging of the head and cervical spine was performed following the standard protocol without intravenous contrast. Multiplanar CT image reconstructions of the cervical spine were also generated. RADIATION DOSE REDUCTION: This exam was performed according to the departmental dose-optimization program which includes automated exposure control, adjustment of the mA and/or kV according to patient size and/or use of iterative reconstruction technique. COMPARISON:  MRI 12/24/2022, CT brain 12/22/2019 FINDINGS: CT HEAD FINDINGS Brain: No acute territorial infarction, hemorrhage or intracranial mass. Atrophy and chronic small vessel ischemic changes of the white matter. Stable ventricle size Vascular: No hyperdense vessels. Vertebral and carotid vascular calcification Skull: Normal. Negative for fracture or focal lesion. Sinuses/Orbits: No acute finding. Other: None CT CERVICAL SPINE FINDINGS Alignment: Reversal of lower cervical lordosis. No subluxation. Facet alignment is maintained. Skull base and vertebrae: No acute fracture. No primary bone lesion or focal  pathologic process. Soft tissues and spinal canal: No prevertebral fluid or swelling. No visible canal hematoma. Disc levels: Advanced degenerative changes C4 through C7. Multilevel facet degenerative change with foraminal narrowing. Upper chest: Apical emphysema Other: None  IMPRESSION: 1. No CT evidence for acute intracranial abnormality. Atrophy and chronic small vessel ischemic changes of the white matter. 2. Reversal of cervical lordosis with multilevel degenerative change. No acute osseous abnormality. Electronically Signed   By: Jasmine Pang M.D.   On: 04/09/2023 22:50   CT Cervical Spine Wo Contrast  Result Date: 04/09/2023 CLINICAL DATA:  Head trauma fall EXAM: CT HEAD WITHOUT CONTRAST CT CERVICAL SPINE WITHOUT CONTRAST TECHNIQUE: Multidetector CT imaging of the head and cervical spine was performed following the standard protocol without intravenous contrast. Multiplanar CT image reconstructions of the cervical spine were also generated. RADIATION DOSE REDUCTION: This exam was performed according to the departmental dose-optimization program which includes automated exposure control, adjustment of the mA and/or kV according to patient size and/or use of iterative reconstruction technique. COMPARISON:  MRI 12/24/2022, CT brain 12/22/2019 FINDINGS: CT HEAD FINDINGS Brain: No acute territorial infarction, hemorrhage or intracranial mass. Atrophy and chronic small vessel ischemic changes of the white matter. Stable ventricle size Vascular: No hyperdense vessels. Vertebral and carotid vascular calcification Skull: Normal. Negative for fracture or focal lesion. Sinuses/Orbits: No acute finding. Other: None CT CERVICAL SPINE FINDINGS Alignment: Reversal of lower cervical lordosis. No subluxation. Facet alignment is maintained. Skull base and vertebrae: No acute fracture. No primary bone lesion or focal pathologic process. Soft tissues and spinal canal: No prevertebral fluid or swelling. No visible canal hematoma. Disc levels: Advanced degenerative changes C4 through C7. Multilevel facet degenerative change with foraminal narrowing. Upper chest: Apical emphysema Other: None IMPRESSION: 1. No CT evidence for acute intracranial abnormality. Atrophy and chronic small vessel ischemic  changes of the white matter. 2. Reversal of cervical lordosis with multilevel degenerative change. No acute osseous abnormality. Electronically Signed   By: Jasmine Pang M.D.   On: 04/09/2023 22:50   DG Chest Portable 1 View  Result Date: 04/09/2023 CLINICAL DATA:  Fall EXAM: PORTABLE CHEST 1 VIEW COMPARISON:  03/14/2008 FINDINGS: Emphysema and bronchitic changes. No acute airspace disease or effusion. Normal cardiac size with aortic atherosclerosis. No pneumothorax. IMPRESSION: Emphysema and bronchitic changes. Electronically Signed   By: Jasmine Pang M.D.   On: 04/09/2023 22:19   DG Elbow 2 Views Left  Result Date: 04/09/2023 CLINICAL DATA:  Fall with elbow pain EXAM: LEFT ELBOW - 2 VIEW COMPARISON:  None Available. FINDINGS: There is no evidence of fracture, dislocation, or joint effusion. There is no evidence of arthropathy or other focal bone abnormality. Soft tissues are unremarkable. IMPRESSION: Negative. Electronically Signed   By: Jasmine Pang M.D.   On: 04/09/2023 22:18   DG Hip Unilat With Pelvis 2-3 Views Left  Result Date: 04/09/2023 CLINICAL DATA:  Fall, left hip pain. EXAM: DG HIP (WITH OR WITHOUT PELVIS) 2-3V LEFT COMPARISON:  None Available. FINDINGS: There is a subcapital impaction fracture of the proximal left femur. The remaining bony structures are intact and there is no dislocation. Vascular calcifications are noted in the pelvis and lower extremities bilaterally. Degenerative changes are present at the right hip and lumbar spine. IMPRESSION: Subcapital impaction fracture of the proximal left femur. Electronically Signed   By: Thornell Sartorius M.D.   On: 04/09/2023 20:34    Review of Systems  Musculoskeletal:  Positive for arthralgias.  All other systems reviewed and are negative.  Blood pressure (!) 145/74, pulse 87, temperature 98.8 F (37.1 C), temperature source Oral, resp. rate 19, height 5\' 3"  (1.6 m), weight 45.4 kg, SpO2 95%. Physical Exam Vitals reviewed.  HENT:      Head: Normocephalic.     Nose: Nose normal.     Mouth/Throat:     Mouth: Mucous membranes are moist.  Eyes:     Pupils: Pupils are equal, round, and reactive to light.  Cardiovascular:     Rate and Rhythm: Normal rate.     Pulses: Normal pulses.  Pulmonary:     Effort: Pulmonary effort is normal.  Abdominal:     General: Abdomen is flat.  Musculoskeletal:     Cervical back: Normal range of motion.  Skin:    General: Skin is warm.     Capillary Refill: Capillary refill takes less than 2 seconds.  Neurological:     General: No focal deficit present.     Mental Status: She is alert.  Psychiatric:        Mood and Affect: Mood normal.   Bilateral upper extremities have good range of motion in the wrist elbows shoulders without crepitus swelling or pain.  Right lower extremity has good range of motion and palpable pedal pulses in the foot.  Knee ankle hip no pain with range of motion.  On the left-hand side there is no ankle or knee swelling but there is some pain with range of motion of the left hip which is slightly shortened on that left lower extremity. Assessment/Plan: Impression is displaced femoral neck fracture in an active 89 year old patient with early Alzheimer's and has few other medical problems.  She does have low vitamin D.  Plan at this time is hemiarthroplasty versus total hip replacement.  Did discuss with the family about less risk of dislocation with the hemiarthroplasty but potentially better pain relief with the hip replacement.  In general I want to see what her bone quality is like before deciding for or against 1 approach for the other.  The risk and benefits of the procedure are discussed including not limited to infection nerve vessel damage leg length inequality as well as instability of the prosthesis as well as perioperative complications including heart attack stroke as well as worsening of her cognitive decline due to unfamiliar environment.  All questions  answered.  Spinal anesthetic would be good in this case.  Patient has penicillin allergy listed but we will attempt test dose of Ancef and then proceed as indicated  G Dorene Grebe 04/10/2023, 10:44 AM

## 2023-04-10 NOTE — Op Note (Signed)
NAME: Kimberly Frye, BETO MEDICAL RECORD NO: [id number] ACCOUNT NO: [id number] DATE OF BIRTH: [birth year] FACILITY: [facility] LOCATION: [facility] PHYSICIAN: Graylin Shiver. August Saucer, MD  Operative Report   DATE OF PROCEDURE: 04/10/2023  PREOPERATIVE DIAGNOSIS:  Left hip femoral neck fracture, displaced.  POSTOPERATIVE DIAGNOSIS:  Left hip femoral neck fracture, displaced.  PROCEDURE PERFORMED:  Left hip femoral neck fracture treatment with total hip replacement, size 4 standard ACTIS cementless stem with Pinnacle acetabular shell, Gription size 50 with standard polyethylene liner and a single cancellous screw with femoral  head 32+5 stainless steel.  SURGEON:  Graylin Shiver. August Saucer, MD  ASSISTANT:  Karenann Cai, PA.  INDICATIONS:  Kimberly Frye is an 89 year old patient with left hip fracture who is ambulatory, who presents for operative management after explanation of risks and benefits.  DESCRIPTION OF PROCEDURE:  The patient was brought to the operating room where general endotracheal anesthesia was induced.  Preoperative antibiotics were administered.  Timeout was called.  The patient was placed on the Hana bed with extremities well  padded.  The left hip region was pre-scrubbed with alcohol and Betadine, allowed to air dry, prepped with DuraPrep solution and draped in sterile manner.  Wall drape was utilized.  Timeout was called.  The patient did use spinal anesthetic.  Incision  made about 2 cm posterior and a 1 cm distal to the anterior superior iliac crest.  Skin and subcutaneous tissue was sharply divided.  Lateral femoral cutaneous nerve was mobilized anteriorly along with most of its branches.  Fascia lata was then incised  and the plane between the rectus and the tensor was developed.  Crossing circumflex vessels were ligated.  Cobra retractors were placed anterior and posterior to the femoral neck.  The capsule was incised in a T-shape fashion along the intertrochanteric  ridge anteriorly and then  posteriorly.  Retractors then placed on the femoral neck.  These leaflets were tagged with #1 Vicryl suture.  At this time, femoral neck cut was made.  The head was then removed with a corkscrew.  Revision femoral neck cut was  made in order to obtain the proper length.  Next, bone quality was good.  The acetabulum was reamed up to 49 mm and in 45 degrees of abduction and about 15 degrees of anteversion.  Good bone quality was present.  Gription cup was then tapped into  position with excellent press fit obtained.  One screw was placed and then a neutral liner was placed.  Next, attention was directed towards the proximal femur.  The capsule was partially released in terms of the conjoined tendon.  Next, we externally  rotated and adducted and extended the leg with the trochanteric retractor and the Mueller retractor placed.  A box cutter was used to lateralize the implant.  Proximal broaching was performed up to a size 4, which gave a very nice fit.  Calcar planning  was performed.  The hip was then reduced with both the +0 and the +5 ball.  The +5 ball gave equal leg lengths as well as exceptional stability with external rotation to 60 degrees and extension to 90.  Hip was dislocated.  Thorough irrigation was  performed.  We did use IrriSept after the incision as well as after the arthrotomy and at multiple times during the case.  The muscles and fascia were then anesthetized using the Marcaine, saline, Exparel combination.  Next, the true stem was placed with very good rotational stability obtained.  Confirmed under fluoroscopy to be the correct size  to fill the canal.  Hip was then reduced with the +5 steel +5, 32 mm femoral head, which gave very good stability and good  leg lengths.  Thorough irrigation was performed at this time. IrriSept solution utilized and then vancomycin powder placed on the implant.  The capsule was then closed using #1 Vicryl suture followed by interrupted inverted #1 Vicryl  suture to close up  the fascia lata and then 0 Vicryl suture, 2-0 Vicryl suture, and 3-0 Monocryl with Steri-Strips and Aquacel dressing applied.  The patient tolerated the procedure well without immediate complications, transferred to the recovery room in stable condition.  Luke's assistance was required for opening, closing, mobilization of tissue.  His assistance was a medical necessity.   MUK D: 04/10/2023 2:55:32 pm T: 04/10/2023 10:42:00 pm  JOB: [id number]/ [id number]

## 2023-04-10 NOTE — Care Plan (Signed)
This 89 yrs old female with PM hx significant of possible dementia, CKD stage III, hypertension, hypothyroidism, dysphagia, anxiety, depression, recent falls and was in SNF for rehabilitation, presented to ED [facility] after fall and left hip pain which occurred 9/27.  X-ray shows left subcapital femur fracture.  Patient admitted for further evaluation orthopedics is consulted.  Patient will need left hip hemiarthroplasty versus total hip replacement.  Patient was seen and examined at bedside.  She reports in  pain.  Which is reasonably controlled now.

## 2023-04-10 NOTE — Brief Op Note (Signed)
   04/10/2023  2:47 PM  PATIENT:  Kimberly Frye  89 y.o. female  PRE-OPERATIVE DIAGNOSIS:  LEFT HIP FRACTURE  POST-OPERATIVE DIAGNOSIS:  LEFT HIP FRACTURE  PROCEDURE:  Procedure(s): ANTERIOR APPROACH TOTAL  HIP ARTHROPLASTY  SURGEON:  Surgeon(s): Cammy Copa, MD  ASSISTANT: magnant pa  ANESTHESIA:   general  EBL: 75 ml    Total I/O In: 250 [IV Piggyback:250] Out: 475 [Urine:400; Blood:75]  BLOOD ADMINISTERED: none  DRAINS: none   LOCAL MEDICATIONS USED: Marcaine morphine clonidine Exparel vancomycin powder  SPECIMEN:  No Specimen  COUNTS:  YES  TOURNIQUET:  * No tourniquets in log *  DICTATION: .Other Dictation: Dictation Number [id number]  PLAN OF CARE: Admit to inpatient   PATIENT DISPOSITION:  PACU - hemodynamically stable

## 2023-04-10 NOTE — H&P (Addendum)
History and Physical    Kimberly Frye:[id number] DOB: May 05, 1933 DOA: 04/09/2023  PCP: Fatima Sanger, FNP   Patient coming from: SNF   Chief Complaint:  Chief Complaint  Patient presents with   Hip Pain    HPI:  Kimberly Frye is a 89 y.o. female with hx of possible dementia, CKD stage III, hypertension, hypothyroidism, dysphagia (on bite sized diet), anxiety, depression, recent falls and was in SNF for rehabilitation, presented to ED [facility] after fall and left hip pain which occurred 9/27. Had actually been discharged from SNF and was being taken to ALF when RN staff there noted pain and inability to ambulate. Transferred from [facility] after finding of a left hip fracture. Discussed with her daughter and son over the phone who confirmed presenting history. On interview patient is confused and disoriented, recalls a fall and left hip pain. Denies other sites of pain.    Review of Systems:  ROS complete and negative except as marked above   Allergies  Allergen Reactions   Bystolic [Nebivolol Hcl] Swelling    Leg edema   Celexa [Citalopram Hydrobromide] Other (See Comments)    "In a daze"   Penicillins Other (See Comments)    Unknown reaction    Prior to Admission medications   Medication Sig Start Date End Date Taking? Authorizing Provider  amLODipine (NORVASC) 2.5 MG tablet Take 1 tablet (2.5 mg total) by mouth daily. Patient taking differently: Take 5 mg by mouth daily. 12/26/22   Cathleen Corti, MD  cetirizine (ZYRTEC ALLERGY) 10 MG tablet Take 10 mg by mouth daily as needed for allergies. 01/27/19   [provider]  diclofenac Sodium (VOLTAREN) 1 % GEL Apply 2 g topically every 6 (six) hours as needed.    [provider]  sertraline (ZOLOFT) 100 MG tablet Take 100 mg by mouth daily.    [provider]  SYNTHROID 75 MCG tablet Take 75 mcg by mouth daily. 09/02/20   [provider]    Past Medical History:  Diagnosis Date    Adult failure to thrive    Anxiety    Anxiety    Chronic kidney disease, stage 3 (HCC)    Depression    HTN (hypertension)    Hypothyroidism    Hypothyroidism    Retention of urine     History reviewed. No pertinent surgical history.   reports that she has never smoked. She has never been exposed to tobacco smoke. She has never used smokeless tobacco. She reports current alcohol use. She reports that she does not use drugs.  History reviewed. No pertinent family history.   Physical Exam: Vitals:   04/09/23 2353 04/10/23 0000 04/10/23 0015 04/10/23 0143  BP:  (!) 136/98 111/86 117/83  Pulse:  86 94 88  Resp:  14 (!) 27 18  Temp: 98.8 F (37.1 C)   97.9 F (36.6 C)  TempSrc: Oral   Oral  SpO2:  97% 96% 96%  Weight:      Height:        Gen: Awake, alert, NAD, chronically ill appearing   CV: Regular, normal S1, S2, 1/6 SEM  Resp: Normal WOB, Few rales in the bases.  Abd: Flat, normoactive, nontender MSK: LLE held in flexion, distally NV intact  Skin: No rashes or lesions to exposed skin  Neuro: Alert and interactive, oriented to self only.   Psych: confused    Data review:   Labs reviewed, notable for:  WBC 10  Hb 11  CK 83   Micro:  No results found for this or any previous visit.  Imaging reviewed:  CT THORACIC SPINE WO CONTRAST  Result Date: 04/09/2023 CLINICAL DATA:  Fall found on floor EXAM: CT THORACIC SPINE WITHOUT CONTRAST TECHNIQUE: Multidetector CT images of the thoracic were obtained using the standard protocol without intravenous contrast. RADIATION DOSE REDUCTION: This exam was performed according to the departmental dose-optimization program which includes automated exposure control, adjustment of the mA and/or kV according to patient size and/or use of iterative reconstruction technique. COMPARISON:  None Available. FINDINGS: Alignment: Scoliosis of the lower thoracic spine. Sagittal alignment is within normal limits. Vertebrae: Normal vertebral  stature. No fracture. Lucent lesion in the T9 vertebral body with some coarse trabecula, potentially a hemangioma. Paraspinal and other soft tissues: Emphysema. No acute paravertebral or paraspinal soft tissue abnormality. Aortic calcification and coronary vascular calcification. Disc levels: Multilevel degenerative osteophytes. IMPRESSION: 1. No CT evidence for acute osseous abnormality. 2. Scoliosis and degenerative change. Aortic Atherosclerosis (ICD10-I70.0) and Emphysema (ICD10-J43.9). Electronically Signed   By: Jasmine Pang M.D.   On: 04/09/2023 22:55   CT Head Wo Contrast  Result Date: 04/09/2023 CLINICAL DATA:  Head trauma fall EXAM: CT HEAD WITHOUT CONTRAST CT CERVICAL SPINE WITHOUT CONTRAST TECHNIQUE: Multidetector CT imaging of the head and cervical spine was performed following the standard protocol without intravenous contrast. Multiplanar CT image reconstructions of the cervical spine were also generated. RADIATION DOSE REDUCTION: This exam was performed according to the departmental dose-optimization program which includes automated exposure control, adjustment of the mA and/or kV according to patient size and/or use of iterative reconstruction technique. COMPARISON:  MRI 12/24/2022, CT brain 12/22/2019 FINDINGS: CT HEAD FINDINGS Brain: No acute territorial infarction, hemorrhage or intracranial mass. Atrophy and chronic small vessel ischemic changes of the white matter. Stable ventricle size Vascular: No hyperdense vessels. Vertebral and carotid vascular calcification Skull: Normal. Negative for fracture or focal lesion. Sinuses/Orbits: No acute finding. Other: None CT CERVICAL SPINE FINDINGS Alignment: Reversal of lower cervical lordosis. No subluxation. Facet alignment is maintained. Skull base and vertebrae: No acute fracture. No primary bone lesion or focal pathologic process. Soft tissues and spinal canal: No prevertebral fluid or swelling. No visible canal hematoma. Disc levels: Advanced  degenerative changes C4 through C7. Multilevel facet degenerative change with foraminal narrowing. Upper chest: Apical emphysema Other: None IMPRESSION: 1. No CT evidence for acute intracranial abnormality. Atrophy and chronic small vessel ischemic changes of the white matter. 2. Reversal of cervical lordosis with multilevel degenerative change. No acute osseous abnormality. Electronically Signed   By: Jasmine Pang M.D.   On: 04/09/2023 22:50   CT Cervical Spine Wo Contrast  Result Date: 04/09/2023 CLINICAL DATA:  Head trauma fall EXAM: CT HEAD WITHOUT CONTRAST CT CERVICAL SPINE WITHOUT CONTRAST TECHNIQUE: Multidetector CT imaging of the head and cervical spine was performed following the standard protocol without intravenous contrast. Multiplanar CT image reconstructions of the cervical spine were also generated. RADIATION DOSE REDUCTION: This exam was performed according to the departmental dose-optimization program which includes automated exposure control, adjustment of the mA and/or kV according to patient size and/or use of iterative reconstruction technique. COMPARISON:  MRI 12/24/2022, CT brain 12/22/2019 FINDINGS: CT HEAD FINDINGS Brain: No acute territorial infarction, hemorrhage or intracranial mass. Atrophy and chronic small vessel ischemic changes of the white matter. Stable ventricle size Vascular: No hyperdense vessels. Vertebral and carotid vascular calcification Skull: Normal. Negative for fracture or focal lesion. Sinuses/Orbits: No acute finding. Other:  None CT CERVICAL SPINE FINDINGS Alignment: Reversal of lower cervical lordosis. No subluxation. Facet alignment is maintained. Skull base and vertebrae: No acute fracture. No primary bone lesion or focal pathologic process. Soft tissues and spinal canal: No prevertebral fluid or swelling. No visible canal hematoma. Disc levels: Advanced degenerative changes C4 through C7. Multilevel facet degenerative change with foraminal narrowing. Upper  chest: Apical emphysema Other: None IMPRESSION: 1. No CT evidence for acute intracranial abnormality. Atrophy and chronic small vessel ischemic changes of the white matter. 2. Reversal of cervical lordosis with multilevel degenerative change. No acute osseous abnormality. Electronically Signed   By: Jasmine Pang M.D.   On: 04/09/2023 22:50   DG Chest Portable 1 View  Result Date: 04/09/2023 CLINICAL DATA:  Fall EXAM: PORTABLE CHEST 1 VIEW COMPARISON:  03/14/2008 FINDINGS: Emphysema and bronchitic changes. No acute airspace disease or effusion. Normal cardiac size with aortic atherosclerosis. No pneumothorax. IMPRESSION: Emphysema and bronchitic changes. Electronically Signed   By: Jasmine Pang M.D.   On: 04/09/2023 22:19   DG Elbow 2 Views Left  Result Date: 04/09/2023 CLINICAL DATA:  Fall with elbow pain EXAM: LEFT ELBOW - 2 VIEW COMPARISON:  None Available. FINDINGS: There is no evidence of fracture, dislocation, or joint effusion. There is no evidence of arthropathy or other focal bone abnormality. Soft tissues are unremarkable. IMPRESSION: Negative. Electronically Signed   By: Jasmine Pang M.D.   On: 04/09/2023 22:18   DG Hip Unilat With Pelvis 2-3 Views Left  Result Date: 04/09/2023 CLINICAL DATA:  Fall, left hip pain. EXAM: DG HIP (WITH OR WITHOUT PELVIS) 2-3V LEFT COMPARISON:  None Available. FINDINGS: There is a subcapital impaction fracture of the proximal left femur. The remaining bony structures are intact and there is no dislocation. Vascular calcifications are noted in the pelvis and lower extremities bilaterally. Degenerative changes are present at the right hip and lumbar spine. IMPRESSION: Subcapital impaction fracture of the proximal left femur. Electronically Signed   By: Thornell Sartorius M.D.   On: 04/09/2023 20:34     OSH ED Course:  Treated with tylenol, case was discussed with Dr. August Saucer of orthopedics, tentative plan for OR this morning    Assessment/Plan:  89 y.o. female  with possible dementia, CKD stage III, hypertension, hypothyroidism, dysphagia (on bite sized diet), anxiety, depression, recent falls and was in SNF for rehabilitation, transferred from [facility] after [facility] c/b left hip fracture.   Left subcapital femur fracture Clinical diagnosis osteoporosis Ground-level fall -Orthopedics Dr. August Saucer consulted, to see in morning tentative plan for OR today -N.p.o. for OR -Antibiotic ppx per orthopedics  -DVT prophylaxis to be readdressed postop -Tele monitoring -PT/OT eval postop  -Pain mgmt: Tylenol prn for mild, oxycodone 2.5 mg p.o. every 6 hours prn for severe; note patient's family would like [place] to be conservative with opiate dosing. But understand likely need for opiates especially postoperatively -Check vitamin D, start Ca carbonate 500 mg elemental twice daily, and vitamin D3 1000 IU daily -Discussed with family higher risk of morbidity and mortality from complications after hip fracture considering her age and comorbid conditions  Dementia with delirium -Delirium precautions -Fall precautions  Chronic medical problems: Dysphagia: Bite-size diet after OR Anxiety, depression: Continue home sertraline Hypothyroidism: Continue home levothyroxine Emphysema: DuoNebs as needed  Body mass index is 17.71 kg/m.    DVT prophylaxis:  SCDs; chemical prophylaxis postop Code Status:  DNR/DNI(Do NOT Intubate); confirmed with the patient's family Diet:  Diet Orders (From admission, onward)    None  Family Communication: Yes discussed with her daughter and son over the phone Consults: Orthopedic surgery Admission status:   Inpatient, Telemetry bed  Severity of Illness: The appropriate patient status for this patient is INPATIENT. Inpatient status is judged to be reasonable and necessary in order to provide the required intensity of service to ensure the patient's safety. The patient's presenting symptoms, physical exam findings, and initial  radiographic and laboratory data in the context of their chronic comorbidities is felt to place them at high risk for further clinical deterioration. Furthermore, it is not anticipated that the patient will be medically stable for discharge from the hospital within 2 midnights of admission.   * I certify that at the point of admission it is my clinical judgment that the patient will require inpatient hospital care spanning beyond 2 midnights from the point of admission due to high intensity of service, high risk for further deterioration and high frequency of surveillance required.*   Dolly Rias, MD [facility]  How to contact the [facility] Attending or Consulting provider 7A - 7P or covering provider during after hours 7P -7A, for this patient.  Check the care team in [facility] and look for a) attending/consulting [facility] provider listed and b) the [facility] team listed Log into [email or web address] and use [facility] universal password to access. If you do not have the password, please contact the hospital operator. Locate the [facility] provider you are looking for under [facility] and page to a number that you can be directly reached. If you still have difficulty reaching the provider, please page the [facility] (Director on Call) for the Hospitalists listed on amion for assistance.  04/10/2023, 2:28 AM

## 2023-04-10 NOTE — Anesthesia Preprocedure Evaluation (Signed)
Anesthesia Evaluation  Patient identified by MRN, date of birth, ID band Patient confused    Reviewed: Allergy & Precautions, H&P , NPO status , Patient's Chart, lab work & pertinent test results, Unable to perform ROS - Chart review only  Airway Mallampati: Unable to assess       Dental  (+) Teeth Intact   Pulmonary neg pulmonary ROS   breath sounds clear to auscultation       Cardiovascular hypertension, Pt. on medications  Rhythm:Regular     Neuro/Psych  PSYCHIATRIC DISORDERS     Dementia negative neurological ROS     GI/Hepatic negative GI ROS, Neg liver ROS,,,  Endo/Other  Hypothyroidism    Renal/GU CRFRenal diseaseLab Results      Component                Value               Date                      NA                       136                 04/10/2023                K                        3.3 (L)             04/10/2023                CO2                      26                  04/10/2023                GLUCOSE                  103 (H)             04/10/2023                BUN                      29 (H)              04/10/2023                CREATININE               0.89                04/10/2023                CALCIUM                  9.5                 04/10/2023                EGFR                     54                  12/31/2022  GFRNONAA                 >60                 04/10/2023                Musculoskeletal  LEFT HIP FRACTURE   Abdominal   Peds  Hematology  (+) Blood dyscrasia, anemia Lab Results      Component                Value               Date                      WBC                      9.7                 04/10/2023                HGB                      11.3 (L)            04/10/2023                HCT                      34.4 (L)            04/10/2023                MCV                      89.8                04/10/2023                PLT                       260                 04/10/2023              Anesthesia Other Findings   Reproductive/Obstetrics                             Anesthesia Physical Anesthesia Plan  ASA: 3  Anesthesia Plan: MAC and Spinal   Post-op Pain Management:    Induction:   PONV Risk Score and Plan: 2 and Propofol infusion and Treatment may vary due to age or medical condition  Airway Management Planned: Nasal Cannula, Natural Airway and Simple Face Mask  Additional Equipment: None  Intra-op Plan:   Post-operative Plan:   Informed Consent:      History available from chart only and Consent reviewed with POA  Plan Discussed with: CRNA and Surgeon  Anesthesia Plan Comments:         Anesthesia Quick Evaluation

## 2023-04-10 NOTE — Transfer of Care (Signed)
Immediate Anesthesia Transfer of Care Note  Patient: Kimberly Frye  Procedure(s) Performed: ANTERIOR APPROACH TOTAL  HIP ARTHROPLASTY (Left: Hip)  Patient Location: PACU  Anesthesia Type:Spinal  Level of Consciousness: drowsy  Airway & Oxygen Therapy: Patient Spontanous Breathing and Patient connected to face mask oxygen  Post-op Assessment: Report given to RN and Post -op Vital signs reviewed and stable  Post vital signs: Reviewed and stable  Last Vitals:  Vitals Value Taken Time  BP 150/65 04/10/23 1430  Temp    Pulse 66 04/10/23 1430  Resp 14 04/10/23 1430  SpO2 99 % 04/10/23 1430  Vitals shown include unfiled device data.  Last Pain:  Vitals:   04/10/23 1039  TempSrc: Oral  PainSc:          Complications: No notable events documented.

## 2023-04-10 NOTE — Plan of Care (Signed)
  Problem: Education: Goal: Knowledge of General Education information will improve Description Including pain rating scale, medication(s)/side effects and non-pharmacologic comfort measures Outcome: Progressing   

## 2023-04-10 NOTE — Plan of Care (Signed)

## 2023-04-11 DIAGNOSIS — S72002A Fracture of unspecified part of neck of left femur, initial encounter for closed fracture: Secondary | ICD-10-CM

## 2023-04-11 LAB — BASIC METABOLIC PANEL
Anion gap: 11 (ref 5–15)
BUN: 18 mg/dL (ref 8–23)
CO2: 24 mmol/L (ref 22–32)
Calcium: 9 mg/dL (ref 8.9–10.3)
Chloride: 99 mmol/L (ref 98–111)
Creatinine, Ser: 0.95 mg/dL (ref 0.44–1.00)
GFR, Estimated: 58 mL/min — ABNORMAL LOW (ref >60)
Glucose, Bld: 123 mg/dL — ABNORMAL HIGH (ref 70–99)
Potassium: 3.5 mmol/L (ref 3.5–5.1)
Sodium: 134 mmol/L — ABNORMAL LOW (ref 135–145)

## 2023-04-11 LAB — CBC WITH DIFFERENTIAL/PLATELET
Abs Immature Granulocytes: 0.07 10*3/uL (ref 0.00–0.07)
Basophils Absolute: 0 10*3/uL (ref 0.0–0.1)
Basophils Relative: 0 %
Eosinophils Absolute: 0 10*3/uL (ref 0.0–0.5)
Eosinophils Relative: 0 %
HCT: 28.6 % — ABNORMAL LOW (ref 36.0–46.0)
Hemoglobin: 9.6 g/dL — ABNORMAL LOW (ref 12.0–15.0)
Immature Granulocytes: 1 %
Lymphocytes Relative: 6 %
Lymphs Abs: 0.8 10*3/uL (ref 0.7–4.0)
MCH: 29.2 pg (ref 26.0–34.0)
MCHC: 33.6 g/dL (ref 30.0–36.0)
MCV: 86.9 fL (ref 80.0–100.0)
Monocytes Absolute: 1.3 10*3/uL — ABNORMAL HIGH (ref 0.1–1.0)
Monocytes Relative: 9 %
Neutro Abs: 11.4 10*3/uL — ABNORMAL HIGH (ref 1.7–7.7)
Neutrophils Relative %: 84 %
Platelets: 259 10*3/uL (ref 150–400)
RBC: 3.29 MIL/uL — ABNORMAL LOW (ref 3.87–5.11)
RDW: 13.6 % (ref 11.5–15.5)
WBC: 13.6 10*3/uL — ABNORMAL HIGH (ref 4.0–10.5)
nRBC: 0 % (ref 0.0–0.2)

## 2023-04-11 LAB — MAGNESIUM: Magnesium: 1.5 mg/dL — ABNORMAL LOW (ref 1.7–2.4)

## 2023-04-11 LAB — PHOSPHORUS: Phosphorus: 2.4 mg/dL — ABNORMAL LOW (ref 2.5–4.6)

## 2023-04-11 MED ORDER — POTASSIUM CHLORIDE 20 MEQ PO PACK
40.0000 meq | PACK | Freq: Once | ORAL | Status: AC
  Administered 2023-04-11: 40 meq via ORAL
  Filled 2023-04-11: qty 2

## 2023-04-11 MED ORDER — MAGNESIUM SULFATE 2 GM/50ML IV SOLN
2.0000 g | Freq: Once | INTRAVENOUS | Status: AC
  Administered 2023-04-11: 2 g via INTRAVENOUS
  Filled 2023-04-11: qty 50

## 2023-04-11 NOTE — Evaluation (Signed)
Physical Therapy Evaluation Patient Details Name: Kimberly Frye MRN: [id number] DOB: May 21, 1933 Today's Date: 04/11/2023  History of Present Illness  Kimberly Frye is a 89 y.o. femalewith recent falls and was in SNF for rehabilitation, presented to ED [facility] after fall and left hip pain which occurred 9/27. Had actually been discharged from SNF and was being taken to ALF when RN staff there noted pain and inability to ambulate; s/p L THA, ost Prec, WBAT;  with hx of possible dementia, CKD stage III, hypertension, hypothyroidism, dysphagia (on bite sized diet), anxiety, depression,  Clinical Impression   Pt admitted with above diagnosis. Lives at ALF, and had just finished a SNF stay for rehab after a fall earlier this year;  Prior to admission, pt was able to walk without assist approx 3-4 months ago; Presents to PT with a functional decline compared to her baseline, decr functional mobility, incr fall risk, and motion restrictions/posterior hip precautions;  Needed 2 person Max assist to go supine to sit, mod assist to stand, and mod assist to perfom step pivot transfer bed to recliner; Depending on amount of assist family can provide at ALF -- may be able to return, but rec 24 hour supervision; at this point, we must consider SNF for rehab; Also with multiple falls, worth considering consulting Palliative Medicine; Pt currently with functional limitations due to the deficits listed below (see PT Problem List). Pt will benefit from skilled PT to increase their independence and safety with mobility to allow discharge to the venue listed below.           If plan is discharge home, recommend the following: A lot of help with walking and/or transfers;A lot of help with bathing/dressing/bathroom   Can travel by private vehicle   No    Equipment Recommendations Rolling walker (2 wheels);BSC/3in1  Recommendations for Other Services  OT consult;Other (comment) (worth considering Palliative  Medicine consult)    Functional Status Assessment Patient has had a recent decline in their functional status and demonstrates the ability to make significant improvements in function in a reasonable and predictable amount of time.     Precautions / Restrictions Precautions Precautions: Fall;Posterior Hip Precaution Booklet Issued: Yes (comment) Precaution Comments: Provided education re: post hip prec to pt, though not sure that she would be able to recall Restrictions Weight Bearing Restrictions: Yes LLE Weight Bearing: Weight bearing as tolerated      Mobility  Bed Mobility Overal bed mobility: Needs Assistance Bed Mobility: Supine to Sit     Supine to sit: +2 for physical assistance, Max assist     General bed mobility comments: Opted for helicopter technique to be able to control for post hip prec and minimize pain with first movement to EOB    Transfers Overall transfer level: Needs assistance Equipment used: Rolling walker (2 wheels) Transfers: Sit to/from Stand, Bed to chair/wheelchair/BSC Sit to Stand: Mod assist   Step pivot transfers: Mod assist       General transfer comment: light mod assist to power up and find balance, slight post lean initially; Tried step pivot transfer with some success, dec weight shift onto LLE, reulting in very small R "steps" while pivoting ot recliner    Ambulation/Gait                  Stairs            Wheelchair Mobility     Tilt Bed    Modified Rankin (Stroke Patients Only)  Balance Overall balance assessment: Needs assistance   Sitting balance-Leahy Scale: Fair       Standing balance-Leahy Scale: Poor                               Pertinent Vitals/Pain Pain Assessment Pain Assessment: Faces Faces Pain Scale: Hurts little more Pain Location: L hip with movement Pain Descriptors / Indicators: Sore Pain Intervention(s): Monitored during session, Repositioned    Home Living  Family/patient expects to be discharged to:: Unsure (Pt poor historian)                 Home Equipment: Agricultural consultant (2 wheels) Additional Comments: Sounds like she moved to ALF relatively recently    Prior Function Prior Level of Function : Patient poor historian/Family not available             Mobility Comments: recent falls       Extremity/Trunk Assessment   Upper Extremity Assessment Upper Extremity Assessment: Generalized weakness    Lower Extremity Assessment Lower Extremity Assessment: Defer to PT evaluation LLE Deficits / Details: Grossly decr AROM adn strength, limited by pain post fracture and surgical repair LLE: Unable to fully assess due to pain       Communication   Communication Communication: Other (comment) (word finding difficulty) Cueing Techniques: Verbal cues;Gestural cues;Tactile cues;Visual cues  Cognition Arousal: Alert Behavior During Therapy: WFL for tasks assessed/performed Overall Cognitive Status: No family/caregiver present to determine baseline cognitive functioning Area of Impairment: Orientation, Attention, Memory, Awareness, Problem solving                 Orientation Level: Person, Place Current Attention Level: Sustained Memory: Decreased recall of precautions, Decreased short-term memory     Awareness: Intellectual            General Comments General comments (skin integrity, edema, etc.): Pt pleasant and participating well; seems to enjoy engaging in conversation    Exercises     Assessment/Plan    PT Assessment Patient needs continued PT services  PT Problem List Decreased strength;Decreased range of motion;Decreased activity tolerance;Decreased balance;Decreased mobility;Decreased coordination;Decreased cognition;Decreased knowledge of use of DME;Decreased safety awareness;Decreased knowledge of precautions;Pain       PT Treatment Interventions DME instruction;Gait training;Functional mobility  training;Therapeutic activities;Therapeutic exercise;Balance training;Neuromuscular re-education;Cognitive remediation;Patient/family education;Wheelchair mobility training    PT Goals (Current goals can be found in the Care Plan section)  Acute Rehab PT Goals Patient Stated Goal: Did not specifically state, but agreeable to OOB PT Goal Formulation: Patient unable to participate in goal setting Time For Goal Achievement: 04/25/23 Potential to Achieve Goals: Good    Frequency Min 1X/week     Co-evaluation PT/OT/SLP Co-Evaluation/Treatment:  ([place])             AM-PAC PT "6 Clicks" Mobility  Outcome Measure Help needed turning from your back to your side while in a flat bed without using bedrails?: Total Help needed moving from lying on your back to sitting on the side of a flat bed without using bedrails?: Total Help needed moving to and from a bed to a chair (including a wheelchair)?: A Lot Help needed standing up from a chair using your arms (e.g., wheelchair or bedside chair)?: A Lot Help needed to walk in hospital room?: Total Help needed climbing 3-5 steps with a railing? : Total 6 Click Score: 8    End of Session Equipment Utilized During Treatment: Gait belt Activity Tolerance:  Patient tolerated treatment well Patient left: in chair;with call bell/phone within reach;with chair alarm set Nurse Communication: Mobility status PT Visit Diagnosis: Unsteadiness on feet (R26.81);Other abnormalities of gait and mobility (R26.89);Repeated falls (R29.6);Muscle weakness (generalized) (M62.81);Pain Pain - Right/Left: Left Pain - part of body: Hip    Time: 5621-3086 PT Time Calculation (min) (ACUTE ONLY): 32 min   Charges:   PT Evaluation $PT Eval Moderate Complexity: 1 Mod   PT General Charges $$ ACUTE PT VISIT: 1 Visit         Van Clines, PT  Acute Rehabilitation Services Office [phone] Secure Chat welcomed   Levi Aland 04/11/2023, 6:28 PM

## 2023-04-11 NOTE — Plan of Care (Signed)
  Problem: Education: Goal: Knowledge of General Education information will improve Description Including pain rating scale, medication(s)/side effects and non-pharmacologic comfort measures Outcome: Progressing   

## 2023-04-11 NOTE — Discharge Summary (Signed)
PROGRESS NOTE    Kimberly Frye  JJO:[id number] DOB: August 24, 1932 DOA: 04/09/2023 PCP: Fatima Sanger, FNP   Brief Narrative:  This 89 yrs old female with PMhx significant of possible dementia, CKD stage IIIa, hypertension, hypothyroidism, dysphagia, anxiety, depression, recent falls and was in SNF for rehabilitation, presented to ED [facility] from [facility] after fall and left hip pain which occurred 9/27.  X-ray shows left subcapital femur fracture.  Patient admitted for further evaluation,  orthopedics is consulted.  Patient will need left hip hemiarthroplasty versus total hip replacement. She reports in pain which is reasonably controlled now.   Assessment & Plan:   Principal Problem:   Hip fracture (HCC) Active Problems:   Closed subcapital fracture of femur, left, initial encounter (HCC)   Femur fracture (HCC)  Left subcapital femur fracture status,  post mechanical fall: Patient presented status post mechanical fall. X-ray showed left subcapital femur fracture. Orthopedics Dr. August Saucer consulted. Status post ORIF.  POD #1. Continue DVT prophylaxis postoperatively. -PT/OT eval postop. Continue adequate pain control with oxycodone. Continue vitamin D and calcium carbonate.    Dementia with delirium: -Delirium precautions -Fall precautions   Chronic medical problems: Dysphagia: Bite-size diet after OR Anxiety, depression: Continue home sertraline. Hypothyroidism: Continue levothyroxine Emphysema: DuoNebs as needed   DVT prophylaxis: SCDs Code Status: DNR Family Communication:  No family at bed side. Disposition Plan:   Status is: Inpatient Remains inpatient appropriate because: Admitted to s/p mechanical fall with a left subcapital femur fracture s/p ORIF.  POD 1.  Patient tolerated well.  PT and OT pending.    Consultants:  Orthopaedics  Procedures: s/p ORIF  Antimicrobials:     Anti-infectives (From admission, onward)    Start     Dose/Rate Route Frequency Ordered  Stop   04/10/23 1600  ceFAZolin (ANCEF) IVPB 2g/100 mL premix        2 g 200 mL/hr over 30 Minutes Intravenous Every 6 hours 04/10/23 1554 04/10/23 2138   04/10/23 1319  vancomycin (VANCOCIN) powder  Status:  Discontinued          As needed 04/10/23 1320 04/10/23 1421   04/10/23 1055  ceFAZolin (ANCEF) 2-4 GM/100ML-% IVPB       Note to Pharmacy: Aquilla Hacker M: cabinet override      04/10/23 1055 04/10/23 2259   04/10/23 1030  ceFAZolin (ANCEF) IVPB 2g/100 mL premix  Status:  Discontinued        2 g 200 mL/hr over 30 Minutes Intravenous On call to O.R. 04/10/23 1020 04/10/23 1554      Subjective: Patient was seen and examined at bedside.  Overnight events noted.  Patient reports doing much better. She still reports having pain in left femur.  Objective: Vitals:   04/10/23 2055 04/10/23 2355 04/11/23 0510 04/11/23 0749  BP: (!) 154/58 (!) 158/63 132/79 (!) 147/56  Pulse: 69 88 87 88  Resp: 13 11 (!) 21 18  Temp: 98.6 F (37 C) 98.8 F (37.1 C) 98.4 F (36.9 C) 98.6 F (37 C)  TempSrc: Oral Axillary Axillary Oral  SpO2: 98% 96%  97%  Weight:      Height:        Intake/Output Summary (Last 24 hours) at 04/11/2023 1146 Last data filed at 04/11/2023 0511 Gross per 24 hour  Intake 1693.52 ml  Output 775 ml  Net 918.52 ml   Filed Weights   04/09/23 1739  Weight: 45.4 kg    Examination:  General exam: Appears calm and comfortable, deconditioned,  not in any acute distress. Respiratory system: Clear to auscultation. Respiratory effort normal.  RR 16 Cardiovascular system: S1 & S2 heard, RRR. No Murmer. No pedal edema. Gastrointestinal system: Abdomen is non distended, soft and non tender. Normal bowel sounds heard. Central nervous system: Alert and oriented x 2. No focal neurological deficits. Extremities: No edema, tenderness noted in the left thigh.s/p ORIF POD 1 Skin: No rashes, lesions or ulcers Psychiatry:. Mood & affect appropriate.     Data Reviewed: I have  personally reviewed following labs and imaging studies  CBC: Recent Labs  Lab 04/09/23 2137 04/10/23 0357  WBC 10.7* 9.7  NEUTROABS 8.4*  --   HGB 11.4* 11.3*  HCT 33.9* 34.4*  MCV 86.7 89.8  PLT 249 260   Basic Metabolic Panel: Recent Labs  Lab 04/09/23 2137 04/10/23 0357  NA 136 136  K 4.2 3.3*  CL 99 100  CO2 24 26  GLUCOSE 108* 103*  BUN 32* 29*  CREATININE 0.96 0.89  CALCIUM 10.0 9.5  MG  --  1.6*  PHOS  --  2.4*   GFR: Estimated Creatinine Clearance: 31.3 mL/min (by C-G formula based on SCr of 0.89 mg/dL). Liver Function Tests: Recent Labs  Lab 04/09/23 2137  AST 23  ALT 16  ALKPHOS 78  BILITOT 0.7  PROT 8.0  ALBUMIN 4.4   No results for input(s): "LIPASE", "AMYLASE" in the last 168 hours. No results for input(s): "AMMONIA" in the last 168 hours. Coagulation Profile: No results for input(s): "INR", "PROTIME" in the last 168 hours. Cardiac Enzymes: Recent Labs  Lab 04/09/23 2137  CKTOTAL 83   BNP (last 3 results) No results for input(s): "PROBNP" in the last 8760 hours. HbA1C: No results for input(s): "HGBA1C" in the last 72 hours. CBG: No results for input(s): "GLUCAP" in the last 168 hours. Lipid Profile: No results for input(s): "CHOL", "HDL", "LDLCALC", "TRIG", "CHOLHDL", "LDLDIRECT" in the last 72 hours. Thyroid Function Tests: No results for input(s): "TSH", "T4TOTAL", "FREET4", "T3FREE", "THYROIDAB" in the last 72 hours. Anemia Panel: No results for input(s): "VITAMINB12", "FOLATE", "FERRITIN", "TIBC", "IRON", "RETICCTPCT" in the last 72 hours. Sepsis Labs: No results for input(s): "PROCALCITON", "LATICACIDVEN" in the last 168 hours.  Recent Results (from the past 240 hour(s))  Surgical pcr screen     Status: None   Collection Time: 04/10/23 10:57 AM   Specimen: Nasal Mucosa; Nasal Swab  Result Value Ref Range Status   MRSA, PCR NEGATIVE NEGATIVE Final   Staphylococcus aureus NEGATIVE NEGATIVE Final    Comment: (NOTE) The Xpert  SA Assay (FDA approved for NASAL specimens in patients 37 years of age and older), is one component of a comprehensive surveillance program. It is not intended to diagnose infection nor to guide or monitor treatment. Performed at [facility], [address], [place], [place] [address]          Radiology Studies: DG HIP UNILAT W OR W/O PELVIS 2-3 VIEWS LEFT  Result Date: 04/10/2023 CLINICAL DATA:  Left hip replacement EXAM: DG HIP (WITH OR WITHOUT PELVIS) 2-3V LEFT COMPARISON:  Intraoperative films from earlier in the same day. FINDINGS: Left hip prosthesis is noted in satisfactory position. No acute bony abnormality is noted. Degenerative changes of the right hip joint and lumbar spine are seen. No soft tissue abnormality is noted. IMPRESSION: Status post left hip replacement. Electronically Signed   By: Alcide Clever M.D.   On: 04/10/2023 19:15   DG HIP UNILAT WITH PELVIS 2-3 VIEWS  LEFT  Result Date: 04/10/2023 CLINICAL DATA:  Elective surgery. EXAM: DG HIP (WITH OR WITHOUT PELVIS) 2-3V LEFT COMPARISON:  None Available. FINDINGS: Eleven fluoroscopic spot views of the pelvis and left hip obtained in the operating room. Sequential images during hip arthroplasty. Fluoroscopy time 35.7 seconds. Dose 3.19 mGy. IMPRESSION: Intraoperative fluoroscopy for left hip arthroplasty. Electronically Signed   By: Narda Rutherford M.D.   On: 04/10/2023 14:34   DG C-Arm 1-60 Min-No Report  Result Date: 04/10/2023 Fluoroscopy was utilized by the requesting physician.  No radiographic interpretation.   DG C-Arm 1-60 Min-No Report  Result Date: 04/10/2023 Fluoroscopy was utilized by the requesting physician.  No radiographic interpretation.   CT THORACIC SPINE WO CONTRAST  Result Date: 04/09/2023 CLINICAL DATA:  Fall found on floor EXAM: CT THORACIC SPINE WITHOUT CONTRAST TECHNIQUE: Multidetector CT images of the thoracic were obtained using the standard protocol without intravenous contrast.  RADIATION DOSE REDUCTION: This exam was performed according to the departmental dose-optimization program which includes automated exposure control, adjustment of the mA and/or kV according to patient size and/or use of iterative reconstruction technique. COMPARISON:  None Available. FINDINGS: Alignment: Scoliosis of the lower thoracic spine. Sagittal alignment is within normal limits. Vertebrae: Normal vertebral stature. No fracture. Lucent lesion in the T9 vertebral body with some coarse trabecula, potentially a hemangioma. Paraspinal and other soft tissues: Emphysema. No acute paravertebral or paraspinal soft tissue abnormality. Aortic calcification and coronary vascular calcification. Disc levels: Multilevel degenerative osteophytes. IMPRESSION: 1. No CT evidence for acute osseous abnormality. 2. Scoliosis and degenerative change. Aortic Atherosclerosis (ICD10-I70.0) and Emphysema (ICD10-J43.9). Electronically Signed   By: Jasmine Pang M.D.   On: 04/09/2023 22:55   CT Head Wo Contrast  Result Date: 04/09/2023 CLINICAL DATA:  Head trauma fall EXAM: CT HEAD WITHOUT CONTRAST CT CERVICAL SPINE WITHOUT CONTRAST TECHNIQUE: Multidetector CT imaging of the head and cervical spine was performed following the standard protocol without intravenous contrast. Multiplanar CT image reconstructions of the cervical spine were also generated. RADIATION DOSE REDUCTION: This exam was performed according to the departmental dose-optimization program which includes automated exposure control, adjustment of the mA and/or kV according to patient size and/or use of iterative reconstruction technique. COMPARISON:  MRI 12/24/2022, CT brain 12/22/2019 FINDINGS: CT HEAD FINDINGS Brain: No acute territorial infarction, hemorrhage or intracranial mass. Atrophy and chronic small vessel ischemic changes of the white matter. Stable ventricle size Vascular: No hyperdense vessels. Vertebral and carotid vascular calcification Skull: Normal.  Negative for fracture or focal lesion. Sinuses/Orbits: No acute finding. Other: None CT CERVICAL SPINE FINDINGS Alignment: Reversal of lower cervical lordosis. No subluxation. Facet alignment is maintained. Skull base and vertebrae: No acute fracture. No primary bone lesion or focal pathologic process. Soft tissues and spinal canal: No prevertebral fluid or swelling. No visible canal hematoma. Disc levels: Advanced degenerative changes C4 through C7. Multilevel facet degenerative change with foraminal narrowing. Upper chest: Apical emphysema Other: None IMPRESSION: 1. No CT evidence for acute intracranial abnormality. Atrophy and chronic small vessel ischemic changes of the white matter. 2. Reversal of cervical lordosis with multilevel degenerative change. No acute osseous abnormality. Electronically Signed   By: Jasmine Pang M.D.   On: 04/09/2023 22:50   CT Cervical Spine Wo Contrast  Result Date: 04/09/2023 CLINICAL DATA:  Head trauma fall EXAM: CT HEAD WITHOUT CONTRAST CT CERVICAL SPINE WITHOUT CONTRAST TECHNIQUE: Multidetector CT imaging of the head and cervical spine was performed following the standard protocol without intravenous contrast. Multiplanar CT image reconstructions of  the cervical spine were also generated. RADIATION DOSE REDUCTION: This exam was performed according to the departmental dose-optimization program which includes automated exposure control, adjustment of the mA and/or kV according to patient size and/or use of iterative reconstruction technique. COMPARISON:  MRI 12/24/2022, CT brain 12/22/2019 FINDINGS: CT HEAD FINDINGS Brain: No acute territorial infarction, hemorrhage or intracranial mass. Atrophy and chronic small vessel ischemic changes of the white matter. Stable ventricle size Vascular: No hyperdense vessels. Vertebral and carotid vascular calcification Skull: Normal. Negative for fracture or focal lesion. Sinuses/Orbits: No acute finding. Other: None CT CERVICAL SPINE  FINDINGS Alignment: Reversal of lower cervical lordosis. No subluxation. Facet alignment is maintained. Skull base and vertebrae: No acute fracture. No primary bone lesion or focal pathologic process. Soft tissues and spinal canal: No prevertebral fluid or swelling. No visible canal hematoma. Disc levels: Advanced degenerative changes C4 through C7. Multilevel facet degenerative change with foraminal narrowing. Upper chest: Apical emphysema Other: None IMPRESSION: 1. No CT evidence for acute intracranial abnormality. Atrophy and chronic small vessel ischemic changes of the white matter. 2. Reversal of cervical lordosis with multilevel degenerative change. No acute osseous abnormality. Electronically Signed   By: Jasmine Pang M.D.   On: 04/09/2023 22:50   DG Chest Portable 1 View  Result Date: 04/09/2023 CLINICAL DATA:  Fall EXAM: PORTABLE CHEST 1 VIEW COMPARISON:  03/14/2008 FINDINGS: Emphysema and bronchitic changes. No acute airspace disease or effusion. Normal cardiac size with aortic atherosclerosis. No pneumothorax. IMPRESSION: Emphysema and bronchitic changes. Electronically Signed   By: Jasmine Pang M.D.   On: 04/09/2023 22:19   DG Elbow 2 Views Left  Result Date: 04/09/2023 CLINICAL DATA:  Fall with elbow pain EXAM: LEFT ELBOW - 2 VIEW COMPARISON:  None Available. FINDINGS: There is no evidence of fracture, dislocation, or joint effusion. There is no evidence of arthropathy or other focal bone abnormality. Soft tissues are unremarkable. IMPRESSION: Negative. Electronically Signed   By: Jasmine Pang M.D.   On: 04/09/2023 22:18   DG Hip Unilat With Pelvis 2-3 Views Left  Result Date: 04/09/2023 CLINICAL DATA:  Fall, left hip pain. EXAM: DG HIP (WITH OR WITHOUT PELVIS) 2-3V LEFT COMPARISON:  None Available. FINDINGS: There is a subcapital impaction fracture of the proximal left femur. The remaining bony structures are intact and there is no dislocation. Vascular calcifications are noted in the  pelvis and lower extremities bilaterally. Degenerative changes are present at the right hip and lumbar spine. IMPRESSION: Subcapital impaction fracture of the proximal left femur. Electronically Signed   By: Thornell Sartorius M.D.   On: 04/09/2023 20:34    Scheduled Meds:  aspirin EC  81 mg Oral BID   calcium carbonate  1 tablet Oral BID WC   cholecalciferol  1,000 Units Oral Daily   docusate sodium  100 mg Oral BID   levothyroxine  75 mcg Oral Q0600   potassium chloride  40 mEq Oral Once   sertraline  100 mg Oral Daily   sodium chloride flush  3 mL Intravenous Q12H   Continuous Infusions:  0.9 % NaCl with KCl 20 mEq / L 75 mL/hr at 04/10/23 2107   magnesium sulfate bolus IVPB       LOS: 1 day    Time spent: 50 mins    Willeen Niece, MD [facility]   If 7PM-7AM, please contact night-coverage

## 2023-04-11 NOTE — Progress Notes (Signed)
Subjective:  89 year old female postop day 1, left total hip arthroplasty for femoral neck fracture.  Doing well this morning, pain is controlled, resting comfortably.  Objective:   VITALS:   Vitals:   04/10/23 2055 04/10/23 2355 04/11/23 0510 04/11/23 0749  BP: (!) 154/58 (!) 158/63 132/79 (!) 147/56  Pulse: 69 88 87 88  Resp: 13 11 (!) 21 18  Temp: 98.6 F (37 C) 98.8 F (37.1 C) 98.4 F (36.9 C) 98.6 F (37 C)  TempSrc: Oral Axillary Axillary Oral  SpO2: 98% 96%  97%  Weight:      Height:        Gen: Awake and alert Left lower extremity: Dressing in place, clean dry and intact, able to perform active plantarflexion/dorsiflexion, sensation intact distally, appropriate distal pulses    Lab Results  Component Value Date   WBC 9.7 04/10/2023   HGB 11.3 (L) 04/10/2023   HCT 34.4 (L) 04/10/2023   MCV 89.8 04/10/2023   PLT 260 04/10/2023     Assessment/Plan:  1 Day Post-Op   - Expected postop acute blood loss anemia - will monitor for symptoms - Anticoagulation per Dr. August Saucer protocol - Up with PT/OT - Pain control - Discharge planning   Samuella Cota, MD 04/11/2023, 10:59 AM

## 2023-04-11 NOTE — TOC CAGE-AID Note (Signed)
Transition of Care [facility]) - CAGE-AID Screening   Patient Details  Name: Kimberly Frye MRN: [id number] Date of Birth: 16-Nov-1932  [facility] of [facility]) CM/SW Contact:    Leota Sauers, RN Phone Number: 04/11/2023, 9:17 PM   Clinical Narrative:  Patient endorses some alcohol use, denies use of illicit substances. Resources not given at this time.  CAGE-AID Screening:    Have You Ever Felt You Ought to Cut Down on Your Drinking or Drug Use?: No Have People Annoyed You By Critizing Your Drinking Or Drug Use?: No Have You Felt Bad Or Guilty About Your Drinking Or Drug Use?: No Have You Ever Had a Drink or Used Drugs First Thing In The Morning to Steady Your Nerves or to Get Rid of a Hangover?: No CAGE-AID Score: 0  Substance Abuse Education Offered: No

## 2023-04-11 NOTE — Anesthesia Postprocedure Evaluation (Signed)
Anesthesia Post Note  Patient: Kimberly Frye  Procedure(s) Performed: ANTERIOR APPROACH TOTAL  HIP ARTHROPLASTY (Left: Hip)     Patient location during evaluation: PACU Anesthesia Type: MAC and Spinal Level of consciousness: awake and patient cooperative Pain management: pain level controlled Vital Signs Assessment: post-procedure vital signs reviewed and stable Respiratory status: spontaneous breathing, nonlabored ventilation, respiratory function stable and patient connected to nasal cannula oxygen Cardiovascular status: stable and blood pressure returned to baseline Postop Assessment: no apparent nausea or vomiting Anesthetic complications: no   No notable events documented.  Last Vitals:  Vitals:   04/11/23 0510 04/11/23 0749  BP: 132/79 (!) 147/56  Pulse: 87 88  Resp: (!) 21 18  Temp: 36.9 C 37 C  SpO2:  97%    Last Pain:  Vitals:   04/11/23 0749  TempSrc: Oral  PainSc:                  Kimberly Frye

## 2023-04-11 NOTE — Plan of Care (Signed)
  Problem: Nutrition: Goal: Adequate nutrition will be maintained Outcome: Progressing   Problem: Coping: Goal: Level of anxiety will decrease Outcome: Progressing   Problem: Pain Managment: Goal: General experience of comfort will improve Outcome: Progressing   Problem: Safety: Goal: Ability to remain free from injury will improve Outcome: Progressing   

## 2023-04-11 NOTE — Evaluation (Signed)
Occupational Therapy Evaluation Patient Details Name: Kimberly Frye MRN: [id number] DOB: Apr 22, 1933 Today's Date: 04/11/2023   History of Present Illness Kimberly Frye is a 89 y.o. femalewith recent falls and was in SNF for rehabilitation, presented to ED [facility] after fall and left hip pain which occurred 9/27. Had actually been discharged from SNF and was being taken to ALF when RN staff there noted pain and inability to ambulate; s/p L THA, ost Prec, WBAT;  with hx of possible dementia, CKD stage III, hypertension, hypothyroidism, dysphagia (on bite sized diet), anxiety, depression,   Clinical Impression   Pt with difficulty reporting PLOF, but states she lived alone and has recently stayed with son and daughter in law a significant amount. Upon eval, pt with cognition consistent with diagnosis, generalized weakness, decreased activity tolerance and pain. Pt needing up to mod A for transfers, max A for LB ADL, and min A for UB ADL. Unsure whether family can provide 24/7 A, thus, recommending continued inpatient rehab <3 hours/day to optimize safety and independence in ADL.      If plan is discharge home, recommend the following: A lot of help with walking and/or transfers;A lot of help with bathing/dressing/bathroom;Assistance with cooking/housework;Direct supervision/assist for medications management;Direct supervision/assist for financial management;Assist for transportation;Help with stairs or ramp for entrance    Functional Status Assessment  Patient has had a recent decline in their functional status and demonstrates the ability to make significant improvements in function in a reasonable and predictable amount of time.  Equipment Recommendations  Other (comment) (defer)    Recommendations for Other Services       Precautions / Restrictions Precautions Precautions: Fall;Posterior Hip Precaution Booklet Issued: Yes (comment) Precaution Comments: Provided education re: post  hip prec to pt, though not sure that she would be able to recall Restrictions Weight Bearing Restrictions: Yes LLE Weight Bearing: Weight bearing as tolerated      Mobility Bed Mobility Overal bed mobility: Needs Assistance Bed Mobility: Supine to Sit     Supine to sit: +2 for safety/equipment, +2 for physical assistance, Max assist     General bed mobility comments: total A with helicopter method to ensure maintenance of precautions and reduce pain. Pt with good initiation at core. Good ability to scoot forward at EOB    Transfers Overall transfer level: Needs assistance Equipment used: Rolling walker (2 wheels) Transfers: Sit to/from Stand, Bed to chair/wheelchair/BSC Sit to Stand: Mod assist     Step pivot transfers: Mod assist            Balance                                           ADL either performed or assessed with clinical judgement   ADL Overall ADL's : Needs assistance/impaired Eating/Feeding: Set up;Bed level   Grooming: Set up;Bed level   Upper Body Bathing: Minimal assistance;Sitting   Lower Body Bathing: Maximal assistance;Sit to/from stand   Upper Body Dressing : Minimal assistance;Sitting   Lower Body Dressing: Maximal assistance;Sit to/from stand   Toilet Transfer: Moderate assistance;Stand-pivot;Rolling walker (2 wheels)           Functional mobility during ADLs: Moderate assistance;Rolling walker (2 wheels)       Vision Baseline Vision/History: 1 Wears glasses Additional Comments: pt unable to report; makes good eye contact with speaker with increased time when multiple  bodies present in room     Perception         Praxis         Pertinent Vitals/Pain Pain Assessment Pain Assessment: Faces Faces Pain Scale: Hurts little more Pain Location: LLE with transfers Pain Descriptors / Indicators: Guarding, Grimacing Pain Intervention(s): Limited activity within patient's tolerance, Monitored during session      Extremity/Trunk Assessment Upper Extremity Assessment Upper Extremity Assessment: Generalized weakness   Lower Extremity Assessment Lower Extremity Assessment: Defer to PT evaluation LLE Deficits / Details: Grossly decr AROM adn strength, limited by pain post fracture and surgical repair LLE: Unable to fully assess due to pain       Communication Communication Communication: Other (comment) (word finding difficulty) Cueing Techniques: Verbal cues;Gestural cues;Tactile cues;Visual cues   Cognition Arousal: Alert Behavior During Therapy: WFL for tasks assessed/performed Overall Cognitive Status: History of cognitive impairments - at baseline                                 General Comments: no family present to determine baseline, but pt able to state son and daughter in law names. Pleasantly confused. Reports she lives alone (per chart in 12/2022, she did but unsure if this is still true). Pt does report that she has been staying with son a lot. FOllows one step commands with incresaed time.     General Comments       Exercises     Shoulder Instructions      Home Living Family/patient expects to be discharged to:: Unsure (Pt poor historian)                             Home Equipment: Agricultural consultant (2 wheels)   Additional Comments: Sounds like she moved to ALF relatively recently      Prior Functioning/Environment Prior Level of Function : Patient poor historian/Family not available             Mobility Comments: recent falls          OT Problem List: Decreased strength;Decreased activity tolerance;Impaired balance (sitting and/or standing);Decreased knowledge of use of DME or AE;Decreased knowledge of precautions;Decreased cognition      OT Treatment/Interventions: Self-care/ADL training;Therapeutic exercise;DME and/or AE instruction;Balance training;Patient/family education;Therapeutic activities;Cognitive  remediation/compensation    OT Goals(Current goals can be found in the care plan section) Acute Rehab OT Goals Patient Stated Goal: get better OT Goal Formulation: With patient Time For Goal Achievement: 04/25/23 Potential to Achieve Goals: Good ADL Goals Pt Will Perform Grooming: with contact guard assist;standing Pt Will Perform Lower Body Dressing: with mod assist;sit to/from stand;with adaptive equipment Pt Will Transfer to Toilet: with contact guard assist;ambulating;regular height toilet Additional ADL Goal #1: Pt will maintain anterior hip precautions during mobility and ADL  with mod cues from therapist  OT Frequency: Min 1X/week    Co-evaluation              AM-PAC OT "6 Clicks" Daily Activity     Outcome Measure Help from another person eating meals?: A Little Help from another person taking care of personal grooming?: A Little Help from another person toileting, which includes using toliet, bedpan, or urinal?: A Lot Help from another person bathing (including washing, rinsing, drying)?: A Lot Help from another person to put on and taking off regular upper body clothing?: A Little Help from another person to put  on and taking off regular lower body clothing?: A Lot 6 Click Score: 15   End of Session Equipment Utilized During Treatment: Gait belt;Rolling walker (2 wheels) Nurse Communication: Mobility status  Activity Tolerance: Patient tolerated treatment well Patient left: in chair;with call bell/phone within reach;with chair alarm set  OT Visit Diagnosis: Unsteadiness on feet (R26.81);Muscle weakness (generalized) (M62.81);Other symptoms and signs involving cognitive function;Pain Pain - Right/Left: Left Pain - part of body: Hip                Time: 1500-1526 OT Time Calculation (min): 26 min Charges:  OT General Charges $OT Visit: 1 Visit OT Evaluation $OT Eval Moderate Complexity: 1 Mod  Tyler Deis, OTR/L [facility] Acute Rehabilitation Office:  [phone]   Myrla Halsted 04/11/2023, 6:26 PM

## 2023-04-12 ENCOUNTER — Encounter (HOSPITAL_COMMUNITY): Payer: Self-pay | Admitting: Orthopedic Surgery

## 2023-04-12 DIAGNOSIS — S72002A Fracture of unspecified part of neck of left femur, initial encounter for closed fracture: Secondary | ICD-10-CM | POA: Diagnosis not present

## 2023-04-12 LAB — BASIC METABOLIC PANEL
Anion gap: 8 (ref 5–15)
BUN: 17 mg/dL (ref 8–23)
CO2: 23 mmol/L (ref 22–32)
Calcium: 8.7 mg/dL — ABNORMAL LOW (ref 8.9–10.3)
Chloride: 101 mmol/L (ref 98–111)
Creatinine, Ser: 0.93 mg/dL (ref 0.44–1.00)
GFR, Estimated: 59 mL/min — ABNORMAL LOW (ref >60)
Glucose, Bld: 99 mg/dL (ref 70–99)
Potassium: 3.8 mmol/L (ref 3.5–5.1)
Sodium: 132 mmol/L — ABNORMAL LOW (ref 135–145)

## 2023-04-12 LAB — CBC
HCT: 27.6 % — ABNORMAL LOW (ref 36.0–46.0)
Hemoglobin: 9.2 g/dL — ABNORMAL LOW (ref 12.0–15.0)
MCH: 29.1 pg (ref 26.0–34.0)
MCHC: 33.3 g/dL (ref 30.0–36.0)
MCV: 87.3 fL (ref 80.0–100.0)
Platelets: 243 10*3/uL (ref 150–400)
RBC: 3.16 MIL/uL — ABNORMAL LOW (ref 3.87–5.11)
RDW: 13.7 % (ref 11.5–15.5)
WBC: 12.5 10*3/uL — ABNORMAL HIGH (ref 4.0–10.5)
nRBC: 0 % (ref 0.0–0.2)

## 2023-04-12 LAB — PHOSPHORUS: Phosphorus: 2.4 mg/dL — ABNORMAL LOW (ref 2.5–4.6)

## 2023-04-12 LAB — MAGNESIUM: Magnesium: 1.7 mg/dL (ref 1.7–2.4)

## 2023-04-12 NOTE — Progress Notes (Signed)
  Subjective: Pt stable - pain ok   Objective: Vital signs in last 24 hours: Temp:  [97.9 F (36.6 C)-98.3 F (36.8 C)] 98 F (36.7 C) (09/30 1529) Pulse Rate:  [73-85] 83 (09/30 1529) Resp:  [17-19] 19 (09/30 1529) BP: (123-170)/(45-68) 133/50 (09/30 1529) SpO2:  [94 %-97 %] 97 % (09/30 1529)  Intake/Output from previous day: 09/29 0701 - 09/30 0700 In: 572.1 [P.O.:120; I.V.:452.1] Out: -  Intake/Output this shift: No intake/output data recorded.  Exam:  Dorsiflexion/Plantar flexion intact No cellulitis present  Labs: Recent Labs    04/09/23 2137 04/10/23 0357 04/11/23 1405 04/12/23 0600  HGB 11.4* 11.3* 9.6* 9.2*   Recent Labs    04/11/23 1405 04/12/23 0600  WBC 13.6* 12.5*  RBC 3.29* 3.16*  HCT 28.6* 27.6*  PLT 259 243   Recent Labs    04/11/23 1405 04/12/23 0600  NA 134* 132*  K 3.5 3.8  CL 99 101  CO2 24 23  BUN 18 17  CREATININE 0.95 0.93  GLUCOSE 123* 99  CALCIUM 9.0 8.7*   No results for input(s): "LABPT", "INR" in the last 72 hours.  Assessment/Plan: Wbat  - for placement later this week - appears medically stable   [facility] [employer] 04/12/2023, 7:20 PM

## 2023-04-12 NOTE — Progress Notes (Signed)
PROGRESS NOTE    Kimberly Frye  QMV:[id number] DOB: 16-Mar-1933 DOA: 04/09/2023 PCP: Fatima Sanger, FNP   Brief Narrative:  This 89 yrs old female with PMhx significant of possible dementia, CKD stage IIIa, hypertension, hypothyroidism, dysphagia, anxiety, depression, recent falls and was in SNF for rehabilitation, presented to ED [facility] after fall and left hip pain which occurred 9/27.  X-ray shows left subcapital femur fracture.  Patient admitted for further evaluation,  orthopedics is consulted.  Patient will need left hip hemiarthroplasty versus total hip replacement. She reports in pain which is reasonably controlled now.   Assessment & Plan:   Principal Problem:   Hip fracture (HCC) Active Problems:   Closed subcapital fracture of femur, left, initial encounter (HCC)   Femur fracture (HCC)  Left subcapital femur fracture status,  post mechanical fall: Patient presented status post mechanical fall. X-ray showed left subcapital femur fracture. Orthopedics Dr. August Saucer consulted. Status post ORIF.  POD # 2. Continue DVT prophylaxis postoperatively. -PT/OT eval postop. Continue adequate pain control with oxycodone. Continue vitamin D and calcium carbonate.  Dementia with delirium: -Delirium precautions -Fall precautions   Chronic medical problems: Dysphagia: Bite-size diet after OR Anxiety, depression: Continue home sertraline. Hypothyroidism: Continue levothyroxine Emphysema: DuoNebs as needed   DVT prophylaxis: SCDs Code Status: DNR Family Communication:  No family at bed side. Disposition Plan:   Status is: Inpatient Remains inpatient appropriate because: Admitted s/p mechanical fall with left subcapital femur fracture s/p ORIF.  POD 2.  Patient tolerated well.   PT and OT recommended SNF.    Consultants:  Orthopaedics  Procedures: s/p ORIF POD 2  Antimicrobials:     Anti-infectives (From admission, onward)    Start     Dose/Rate Route Frequency  Ordered Stop   04/10/23 1600  ceFAZolin (ANCEF) IVPB 2g/100 mL premix        2 g 200 mL/hr over 30 Minutes Intravenous Every 6 hours 04/10/23 1554 04/10/23 2138   04/10/23 1319  vancomycin (VANCOCIN) powder  Status:  Discontinued          As needed 04/10/23 1320 04/10/23 1421   04/10/23 1055  ceFAZolin (ANCEF) 2-4 GM/100ML-% IVPB       Note to Pharmacy: Aquilla Hacker M: cabinet override      04/10/23 1055 04/10/23 2259   04/10/23 1030  ceFAZolin (ANCEF) IVPB 2g/100 mL premix  Status:  Discontinued        2 g 200 mL/hr over 30 Minutes Intravenous On call to O.R. 04/10/23 1020 04/10/23 1554      Subjective: Patient was seen and examined at bedside.  Overnight events noted.   Patient reports doing much better.  She still reports having pain in the left femur area. She is awaiting SNF placement.  Insurance authorization is pending  Objective: Vitals:   04/11/23 1610 04/11/23 1932 04/12/23 0426 04/12/23 0733  BP: (!) 136/47 (!) 123/45 (!) 170/68 (!) 141/62  Pulse:  73 80 85  Resp: 18   17  Temp: 98.2 F (36.8 C)  97.9 F (36.6 C) 98.3 F (36.8 C)  TempSrc: Oral  Oral   SpO2: (!) 88% 97% 95% 94%  Weight:      Height:        Intake/Output Summary (Last 24 hours) at 04/12/2023 1416 Last data filed at 04/12/2023 1304 Gross per 24 hour  Intake 764.46 ml  Output --  Net 764.46 ml   Filed Weights   04/09/23 1739  Weight: 45.4 kg  Examination:  General exam: Appears comfortable, deconditioned, not in any acute distress. Respiratory system: CTA bilaterally,  Respiratory effort normal.  RR 15 Cardiovascular system: S1 & S2 heard, RRR. No Murmer. No pedal edema. Gastrointestinal system: Abdomen is non distended, soft and non tender. Normal bowel sounds heard. Central nervous system: Alert and oriented x 2. No focal neurological deficits. Extremities: No edema, tenderness noted in the left thigh. s/p ORIF POD 2 Skin: No rashes, lesions or ulcers Psychiatry:. Mood & affect  appropriate.     Data Reviewed: I have personally reviewed following labs and imaging studies  CBC: Recent Labs  Lab 04/09/23 2137 04/10/23 0357 04/11/23 1405 04/12/23 0600  WBC 10.7* 9.7 13.6* 12.5*  NEUTROABS 8.4*  --  11.4*  --   HGB 11.4* 11.3* 9.6* 9.2*  HCT 33.9* 34.4* 28.6* 27.6*  MCV 86.7 89.8 86.9 87.3  PLT 249 260 259 243   Basic Metabolic Panel: Recent Labs  Lab 04/09/23 2137 04/10/23 0357 04/11/23 1405 04/12/23 0600  NA 136 136 134* 132*  K 4.2 3.3* 3.5 3.8  CL 99 100 99 101  CO2 24 26 24 23   GLUCOSE 108* 103* 123* 99  BUN 32* 29* 18 17  CREATININE 0.96 0.89 0.95 0.93  CALCIUM 10.0 9.5 9.0 8.7*  MG  --  1.6* 1.5* 1.7  PHOS  --  2.4* 2.4* 2.4*   GFR: Estimated Creatinine Clearance: 30 mL/min (by C-G formula based on SCr of 0.93 mg/dL). Liver Function Tests: Recent Labs  Lab 04/09/23 2137  AST 23  ALT 16  ALKPHOS 78  BILITOT 0.7  PROT 8.0  ALBUMIN 4.4   No results for input(s): "LIPASE", "AMYLASE" in the last 168 hours. No results for input(s): "AMMONIA" in the last 168 hours. Coagulation Profile: No results for input(s): "INR", "PROTIME" in the last 168 hours. Cardiac Enzymes: Recent Labs  Lab 04/09/23 2137  CKTOTAL 83   BNP (last 3 results) No results for input(s): "PROBNP" in the last 8760 hours. HbA1C: No results for input(s): "HGBA1C" in the last 72 hours. CBG: No results for input(s): "GLUCAP" in the last 168 hours. Lipid Profile: No results for input(s): "CHOL", "HDL", "LDLCALC", "TRIG", "CHOLHDL", "LDLDIRECT" in the last 72 hours. Thyroid Function Tests: No results for input(s): "TSH", "T4TOTAL", "FREET4", "T3FREE", "THYROIDAB" in the last 72 hours. Anemia Panel: No results for input(s): "VITAMINB12", "FOLATE", "FERRITIN", "TIBC", "IRON", "RETICCTPCT" in the last 72 hours. Sepsis Labs: No results for input(s): "PROCALCITON", "LATICACIDVEN" in the last 168 hours.  Recent Results (from the past 240 hour(s))  Surgical pcr  screen     Status: None   Collection Time: 04/10/23 10:57 AM   Specimen: Nasal Mucosa; Nasal Swab  Result Value Ref Range Status   MRSA, PCR NEGATIVE NEGATIVE Final   Staphylococcus aureus NEGATIVE NEGATIVE Final    Comment: (NOTE) The Xpert SA Assay (FDA approved for NASAL specimens in patients 59 years of age and older), is one component of a comprehensive surveillance program. It is not intended to diagnose infection nor to guide or monitor treatment. Performed at [facility], [address], [place], [place] [address]     Radiology Studies: DG HIP UNILAT W OR W/O PELVIS 2-3 VIEWS LEFT  Result Date: 04/10/2023 CLINICAL DATA:  Left hip replacement EXAM: DG HIP (WITH OR WITHOUT PELVIS) 2-3V LEFT COMPARISON:  Intraoperative films from earlier in the same day. FINDINGS: Left hip prosthesis is noted in satisfactory position. No acute bony abnormality is noted. Degenerative changes  of the right hip joint and lumbar spine are seen. No soft tissue abnormality is noted. IMPRESSION: Status post left hip replacement. Electronically Signed   By: Alcide Clever M.D.   On: 04/10/2023 19:15    Scheduled Meds:  aspirin EC  81 mg Oral BID   calcium carbonate  1 tablet Oral BID WC   cholecalciferol  1,000 Units Oral Daily   docusate sodium  100 mg Oral BID   levothyroxine  75 mcg Oral Q0600   sertraline  100 mg Oral Daily   sodium chloride flush  3 mL Intravenous Q12H   Continuous Infusions:  0.9 % NaCl with KCl 20 mEq / L Stopped (04/12/23 0727)     LOS: 2 days    Time spent: 35 mins    Willeen Niece, MD [facility]   If 7PM-7AM, please contact night-coverage

## 2023-04-12 NOTE — Progress Notes (Signed)
Physical Therapy Treatment Patient Details Name: Kimberly Frye MRN: [id number] DOB: 1932-11-23 Today's Date: 04/12/2023   History of Present Illness Kimberly Frye is a 89 y.o. femalewith recent falls and was in SNF for rehabilitation, presented to ED [facility] after fall and left hip pain which occurred 9/27. Had actually been discharged from SNF and was being taken to ALF when RN staff there noted pain and inability to ambulate; s/p L THA, ost Prec, WBAT;  with hx of possible dementia, CKD stage III, hypertension, hypothyroidism, dysphagia (on bite sized diet), anxiety, depression,    PT Comments  Continuing work on functional mobility and activity tolerance;  session focused on gait training, and pt was able to walk 10 feet with min/mod assist, short steps; cuesd pt for gait sequence, to use UEs pushing into RW to unweigh painful L hip during stance; Mod assist to steady during efforts for more upright posture; Notably, pt participated very well, and showed very nice bridging to move hips towards EOB; showing good rehab potential   If plan is discharge home, recommend the following: A lot of help with walking and/or transfers;A lot of help with bathing/dressing/bathroom   Can travel by private vehicle     No  Equipment Recommendations  Rolling walker (2 wheels);BSC/3in1    Recommendations for Other Services OT consult;Other (comment) (worth considering Palliative Medicine consult)     Precautions / Restrictions Precautions Precautions: Fall;Posterior Hip Precaution Booklet Issued: Yes (comment) Precaution Comments: Provided education re: post hip prec to pt, though not sure that she would be able to recall; Described Post Hip Precautions to pt's family in detail on the phone Restrictions Weight Bearing Restrictions: No LLE Weight Bearing: Weight bearing as tolerated     Mobility  Bed Mobility Overal bed mobility: Needs Assistance Bed Mobility: Supine to Sit     Supine to  sit: Mod assist     General bed mobility comments: Pt able to perform bridging of hips to EOB; some soreness L hip, but using it well with bridging; close guard for post hip precautions; Mod assist to elevate trunk to sit    Transfers Overall transfer level: Needs assistance Equipment used: Rolling walker (2 wheels) Transfers: Sit to/from Stand Sit to Stand: Mod assist           General transfer comment: Light mod assist to power up and steady; cues for LLE placement to minimize hip flexion; cues for hand placement, esp with sitting down to recliner    Ambulation/Gait Ambulation/Gait assistance: Min assist, Mod assist Gait Distance (Feet): 10 Feet (walked from bed to window) Assistive device: Rolling walker (2 wheels) Gait Pattern/deviations: Step-to pattern, Decreased stance time - left, Trunk flexed, Antalgic       General Gait Details: Cues for gait sequence and to bear weight through UEs on RW to unweigh painful L hip in stance   Stairs             Wheelchair Mobility     Tilt Bed    Modified Rankin (Stroke Patients Only)       Balance     Sitting balance-Leahy Scale: Fair       Standing balance-Leahy Scale: Poor                              Cognition Arousal: Alert Behavior During Therapy: WFL for tasks assessed/performed Overall Cognitive Status: History of cognitive impairments - at baseline  General Comments: Confirmed with pt's family her baseline of decr memory        Exercises Total Joint Exercises Ankle Circles/Pumps: AROM, Both, 5 reps Heel Slides: AROM, Left, Other (comment) (2 reps)    General Comments General comments (skin integrity, edema, etc.): Pleasant and cooperative, even with soreness L hip; very much wanting OOB; seemed surprised that walking was difficult today -- responded well to encouragement      Pertinent Vitals/Pain Pain Assessment Pain Assessment:  Faces Faces Pain Scale: Hurts little more Pain Location: LLE with transfers and weight bearing Pain Descriptors / Indicators: Guarding, Grimacing Pain Intervention(s): Monitored during session, Limited activity within patient's tolerance    Home Living                          Prior Function            PT Goals (current goals can now be found in the care plan section) Acute Rehab PT Goals Patient Stated Goal: Did not specifically state, but agreeable to OOB PT Goal Formulation: Patient unable to participate in goal setting Time For Goal Achievement: 04/25/23 Potential to Achieve Goals: Good Progress towards PT goals: Progressing toward goals    Frequency    Min 1X/week      PT Plan      Co-evaluation              AM-PAC PT "6 Clicks" Mobility   Outcome Measure  Help needed turning from your back to your side while in a flat bed without using bedrails?: A Lot Help needed moving from lying on your back to sitting on the side of a flat bed without using bedrails?: A Lot Help needed moving to and from a bed to a chair (including a wheelchair)?: A Lot Help needed standing up from a chair using your arms (e.g., wheelchair or bedside chair)?: A Lot Help needed to walk in hospital room?: A Lot Help needed climbing 3-5 steps with a railing? : Total 6 Click Score: 11    End of Session Equipment Utilized During Treatment: Gait belt Activity Tolerance: Patient tolerated treatment well Patient left: in chair;with call bell/phone within reach;with chair alarm set Nurse Communication: Mobility status PT Visit Diagnosis: Unsteadiness on feet (R26.81);Other abnormalities of gait and mobility (R26.89);Repeated falls (R29.6);Muscle weakness (generalized) (M62.81);Pain Pain - Right/Left: Left Pain - part of body: Hip     Time: 5621-3086 PT Time Calculation (min) (ACUTE ONLY): 33 min  Charges:    $Gait Training: 8-22 mins $Therapeutic Activity: 8-22 mins PT  General Charges $$ ACUTE PT VISIT: 1 Visit                     Van Clines, PT  Acute Rehabilitation Services Office [phone] Secure Chat welcomed    Levi Aland 04/12/2023, 5:07 PM

## 2023-04-12 NOTE — TOC Initial Note (Addendum)
Transition of Care [facility]) - Initial/Assessment Note    Patient Details  Name: Kimberly Frye MRN: [id number] Date of Birth: August 07, 1932  [facility]) CM/SW Contact:    Epifanio Lesches, RN Phone Number: 04/12/2023, 12:38 PM  Clinical Narrative:                 Pt with recent fall @ SNF [facility]). The day of d/c from SNF pt transition to ALF [facility]), however, it was noted @ ALF pt with problems walking the day of admission. Pt then admitted to the hospital for evaluation. Found to have hip fx.      - S/p L  total hip replacement , 9/28  Pt with hx of dementia, confused. NCM called and spoke with son Onalee Hua  and daughter in law Darl Pikes regarding d/c planning. NCM received consult for possible SNF placement at time of discharge.NCM spoke with patient's son/daughter in law regarding PT recommendation of SNF placement at time of discharge. Son is currently unable to care for patient at their home given patient's current physical needs and fall risk. Son/daughter in law expressed understanding of PT's recommendation and is agreeable to SNF placement at time of discharge if needed. Reports preference for [facility] if needed, 2nd choice [facility] , [place]... [facility], [facility] .   Son/ daughter aware of insurance authorization process.  Left Medicare SNF ratings list @ bedside for son/daughter in law.  No further questions reported at this time.  TOC team will  continue to follow and assist with discharge planning needs.   Purpose [place] (Son) [phone] [place]( daughter in law) [phone]   Expected Discharge Plan: Skilled Nursing Facility Barriers to Discharge: Continued Medical Work up   Patient Goals and CMS Choice     Choice offered to / list presented to : Adult Children      Expected Discharge Plan and Services   Discharge Planning Services: CM Consult   Living arrangements for the past 2 months: Skilled Nursing Facility                                       Prior Living Arrangements/Services Living arrangements for the past 2 months: Skilled Nursing Facility Lives with:: Self Patient language and need for interpreter reviewed:: Yes        Need for Family Participation in Patient Care: Yes (Comment) Care giver support system in place?: No (comment)   Criminal Activity/Legal Involvement Pertinent to Current Situation/Hospitalization: No - Comment as needed  Activities of Daily Living      Permission Sought/Granted                  Emotional Assessment Appearance:: Appears stated age     Orientation: : Oriented to Self Alcohol / Substance Use: Not Applicable Psych Involvement: No (comment)  Admission diagnosis:  Hip fracture (HCC) [S72.009A] Closed subcapital fracture of femur, left, initial encounter (HCC) [S72.012A] Femur fracture (HCC) [S72.90XA] Patient Active Problem List   Diagnosis Date Noted   Hip fracture (HCC) 04/10/2023   Closed subcapital fracture of femur, left, initial encounter (HCC) 04/10/2023   Femur fracture (HCC) 04/10/2023   Dementia due to Alzheimer's disease (HCC) 03/18/2023   Cognitive impairment 01/02/2023   Stage 3b chronic kidney disease (HCC) 01/02/2023   Cerebral atrophy (HCC) 01/02/2023   Major depressive disorder, single episode, severe with psychotic features (HCC)  01/02/2023   Moderate protein-calorie malnutrition (HCC) 01/02/2023   AKI (acute kidney injury) (HCC) 12/23/2022   Orthostatic hypotension 12/23/2022   AMS (altered mental status) 12/22/2022   Encephalopathy 12/22/2022   Abdominal aortic atherosclerosis (HCC) 10/21/2020   Adjustment disorder 10/21/2020   Allergic rhinitis 10/21/2020   Chronic kidney disease due to hypertension 10/21/2020   Essential hypertension 10/21/2020   History of malignant neoplasm of skin 10/21/2020   Hyperlipidemia 10/21/2020   Hypothyroidism 10/21/2020   Menopausal flushing 10/21/2020   Mild anxiety  10/21/2020   Mixed anxiety and depressive disorder 10/21/2020   Pure hypercholesterolemia 10/21/2020   Tobacco user 10/21/2020   Vitamin D deficiency 10/21/2020   PCP:  Fatima Sanger, FNP Pharmacy:   [id number] [place], [place] - [address] [place] [address] Phone: [phone] Fax: [phone]  [facility] - [place], [place] - [address] [place] [address] Phone: [phone] Fax: [phone]     Social Determinants of Health (SDOH) Social History: SDOH Screenings   Food Insecurity: No Food Insecurity (12/22/2022)  Housing: Low Risk  (12/22/2022)  Transportation Needs: No Transportation Needs (12/22/2022)  Utilities: Not At Risk (12/22/2022)  Tobacco Use: Low Risk  (04/10/2023)  Recent Concern: Tobacco Use - Medium Risk (02/26/2023)   Received from [facility]   SDOH Interventions:     Readmission Risk Interventions     No data to display

## 2023-04-12 NOTE — Plan of Care (Signed)

## 2023-04-12 NOTE — Consult Note (Signed)
Value-Based Care Institute  [facility] Inpatient Consult   04/12/2023  LONDON NONAKA [birth year] [id number]  [facility] [[facility]]  Accountable Care Organization [ACO] Patient: Medicare ACO REACH  Primary Care Provider: Fatima Sanger, FNP,  [facility], provider is listed for the transition of care follow up appointments.   [facility] Liaison received a notice regarding patient's admission from inpatient [facility] RN who is working with son andn daughter in Social worker.   The patient was screened for  day readmission hospitalization with noted medium risk score for unplanned readmission risk hospital.  The patient was assessed for potential [facility]) Care Management service needs for post hospital transition for care coordination. Review of patient's electronic medical record reveals patient is admitted with a hip fracture.   Plan: [facility] Liaison will continue to follow progress and disposition to asess for post hospital community care coordination/management needs.  Referral request for community care coordination: and if patient goes to a [facility] affiliated facility will alert [facility] RN of any follow up needs.  However, patient is likely to return to ALF if goes to SNF -rehab.   Community Care Management/Population Health does not replace or interfere with any arrangements made by the Inpatient Transition of Care team.   For questions contact:   Charlesetta Shanks, RN, BSN, CCM [place]  [facility], [facility] Liaison Direct Dial: [phone] or secure chat Website: [email or web address]

## 2023-04-12 NOTE — NC FL2 (Signed)
[place] MEDICAID FL2 LEVEL OF CARE FORM     IDENTIFICATION  Patient Name: Kimberly Frye Birthdate: Aug 04, 1932 Sex: female Admission Date (Current Location): 04/09/2023  [facility] and [place] Number:  Producer, television/film/video and Address:         Provider Number: [id number]  Attending Physician Name and Address:  Willeen Niece, MD  Relative Name and Phone Number:       Current Level of Care: Hospital Recommended Level of Care: Skilled Nursing Facility Prior Approval Number:    Date Approved/Denied:   PASRR Number: [id number]  Discharge Plan: SNF    Current Diagnoses: Patient Active Problem List   Diagnosis Date Noted   Hip fracture (HCC) 04/10/2023   Closed subcapital fracture of femur, left, initial encounter (HCC) 04/10/2023   Femur fracture (HCC) 04/10/2023   Dementia due to Alzheimer's disease (HCC) 03/18/2023   Cognitive impairment 01/02/2023   Stage 3b chronic kidney disease (HCC) 01/02/2023   Cerebral atrophy (HCC) 01/02/2023   Major depressive disorder, single episode, severe with psychotic features (HCC) 01/02/2023   Moderate protein-calorie malnutrition (HCC) 01/02/2023   AKI (acute kidney injury) (HCC) 12/23/2022   Orthostatic hypotension 12/23/2022   AMS (altered mental status) 12/22/2022   Encephalopathy 12/22/2022   Abdominal aortic atherosclerosis (HCC) 10/21/2020   Adjustment disorder 10/21/2020   Allergic rhinitis 10/21/2020   Chronic kidney disease due to hypertension 10/21/2020   Essential hypertension 10/21/2020   History of malignant neoplasm of skin 10/21/2020   Hyperlipidemia 10/21/2020   Hypothyroidism 10/21/2020   Menopausal flushing 10/21/2020   Mild anxiety 10/21/2020   Mixed anxiety and depressive disorder 10/21/2020   Pure hypercholesterolemia 10/21/2020   Tobacco user 10/21/2020   Vitamin D deficiency 10/21/2020    Orientation RESPIRATION BLADDER Height & Weight     Self, Place (hx of possible dementia,)  Normal  External catheter Weight: 100 lb (45.4 kg) Height:  5\' 3"  (160 cm)  BEHAVIORAL SYMPTOMS/MOOD NEUROLOGICAL BOWEL NUTRITION STATUS      Continent Diet (refer to d csummary)  AMBULATORY STATUS COMMUNICATION OF NEEDS Skin   Extensive Assist Verbally Normal                       Personal Care Assistance Level of Assistance  Bathing, Feeding, Dressing Bathing Assistance: Maximum assistance Feeding assistance: Independent Dressing Assistance: Maximum assistance     Functional Limitations Info  Sight, Speech, Hearing Sight Info: Adequate Hearing Info: Adequate Speech Info: Adequate    SPECIAL CARE FACTORS FREQUENCY  PT (By licensed PT), OT (By licensed OT)     PT Frequency: 5x/week, evaluate and treat OT Frequency: 5x/week, evaluate and treat (5x/week, evaluate and treat)            Contractures Contractures Info: Not present    Additional Factors Info  Code Status, Allergies Code Status Info: Full Code Allergies Info: : Bystolic (Nebivolol Hcl), Celexa (Citalopram Hydrobromide), Penicillins           Current Medications (04/12/2023):  This is the current hospital active medication list Current Facility-Administered Medications  Medication Dose Route Frequency Provider Last Rate Last Admin   acetaminophen (TYLENOL) tablet 1,000 mg  1,000 mg Oral Q6H PRN Cammy Copa, MD   1,000 mg at 04/11/23 1750   aspirin EC tablet 81 mg  81 mg Oral BID Cammy Copa, MD   81 mg at 04/12/23 0902   calcium carbonate (OS-CAL - dosed in mg of elemental calcium) tablet 1,250 mg  1  tablet Oral BID WC Cammy Copa, MD   1,250 mg at 04/12/23 0902   cholecalciferol (VITAMIN D3) 25 MCG (1000 UNIT) tablet 1,000 Units  1,000 Units Oral Daily Cammy Copa, MD   1,000 Units at 04/12/23 0901   docusate sodium (COLACE) capsule 100 mg  100 mg Oral BID Cammy Copa, MD   100 mg at 04/12/23 0902   ipratropium-albuterol (DUONEB) 0.5-2.5 (3) MG/3ML nebulizer solution 3  mL  3 mL Nebulization Q6H PRN Cammy Copa, MD       levothyroxine (SYNTHROID) tablet 75 mcg  75 mcg Oral Q0600 Cammy Copa, MD   75 mcg at 04/12/23 1610   menthol-cetylpyridinium (CEPACOL) lozenge 3 mg  1 lozenge Oral PRN Cammy Copa, MD       Or   phenol (CHLORASEPTIC) mouth spray 1 spray  1 spray Mouth/Throat PRN Cammy Copa, MD       metoCLOPramide (REGLAN) tablet 5-10 mg  5-10 mg Oral Q8H PRN Cammy Copa, MD       Or   metoCLOPramide (REGLAN) injection 5-10 mg  5-10 mg Intravenous Q8H PRN Cammy Copa, MD       ondansetron [facility]) tablet 4 mg  4 mg Oral Q6H PRN Cammy Copa, MD       Or   ondansetron [facility]) injection 4 mg  4 mg Intravenous Q6H PRN Cammy Copa, MD       oxyCODONE (Oxy IR/ROXICODONE) immediate release tablet 2.5 mg  2.5 mg Oral Q6H PRN Cammy Copa, MD   2.5 mg at 04/11/23 0247   sertraline (ZOLOFT) tablet 100 mg  100 mg Oral Daily Cammy Copa, MD   100 mg at 04/12/23 0902   sodium chloride flush (NS) 0.9 % injection 3 mL  3 mL Intravenous Q12H Cammy Copa, MD   3 mL at 04/12/23 9604     Discharge Medications: Please see discharge summary for a list of discharge medications.  Relevant Imaging Results:  Relevant Lab Results:   Additional Information SS#:  [id number]  Lorri Frederick, LCSW

## 2023-04-13 DIAGNOSIS — S72002A Fracture of unspecified part of neck of left femur, initial encounter for closed fracture: Secondary | ICD-10-CM | POA: Diagnosis not present

## 2023-04-13 LAB — CBC
HCT: 28.2 % — ABNORMAL LOW (ref 36.0–46.0)
Hemoglobin: 9.6 g/dL — ABNORMAL LOW (ref 12.0–15.0)
MCH: 29.4 pg (ref 26.0–34.0)
MCHC: 34 g/dL (ref 30.0–36.0)
MCV: 86.5 fL (ref 80.0–100.0)
Platelets: 276 10*3/uL (ref 150–400)
RBC: 3.26 MIL/uL — ABNORMAL LOW (ref 3.87–5.11)
RDW: 13.6 % (ref 11.5–15.5)
WBC: 12.2 10*3/uL — ABNORMAL HIGH (ref 4.0–10.5)
nRBC: 0 % (ref 0.0–0.2)

## 2023-04-13 LAB — BASIC METABOLIC PANEL
Anion gap: 12 (ref 5–15)
BUN: 16 mg/dL (ref 8–23)
CO2: 25 mmol/L (ref 22–32)
Calcium: 9 mg/dL (ref 8.9–10.3)
Chloride: 97 mmol/L — ABNORMAL LOW (ref 98–111)
Creatinine, Ser: 0.75 mg/dL (ref 0.44–1.00)
GFR, Estimated: >60 mL/min (ref >60)
Glucose, Bld: 91 mg/dL (ref 70–99)
Potassium: 3.4 mmol/L — ABNORMAL LOW (ref 3.5–5.1)
Sodium: 134 mmol/L — ABNORMAL LOW (ref 135–145)

## 2023-04-13 LAB — PHOSPHORUS: Phosphorus: 3 mg/dL (ref 2.5–4.6)

## 2023-04-13 LAB — MAGNESIUM: Magnesium: 1.5 mg/dL — ABNORMAL LOW (ref 1.7–2.4)

## 2023-04-13 MED ORDER — POTASSIUM CHLORIDE 20 MEQ PO PACK
40.0000 meq | PACK | Freq: Once | ORAL | Status: AC
  Administered 2023-04-13: 40 meq via ORAL
  Filled 2023-04-13: qty 2

## 2023-04-13 MED ORDER — MAGNESIUM SULFATE 2 GM/50ML IV SOLN
2.0000 g | Freq: Once | INTRAVENOUS | Status: AC
  Administered 2023-04-13: 2 g via INTRAVENOUS
  Filled 2023-04-13: qty 50

## 2023-04-13 NOTE — Plan of Care (Signed)

## 2023-04-13 NOTE — Evaluation (Signed)
Clinical/Bedside Swallow Evaluation Patient Details  Name: VIVICA BLAYNEY MRN: [id number] Date of Birth: 01-Dec-1932  Today's Date: 04/13/2023 Time: SLP Start Time (ACUTE ONLY): 1055 SLP Stop Time (ACUTE ONLY): 1106 SLP Time Calculation (min) (ACUTE ONLY): 11 min  Past Medical History:  Past Medical History:  Diagnosis Date   Abnormal barium swallow    [facility], family stated test showed water would go down the wrong way,went thru speech therapy, mechanical diet, food neds to be chopped up.   Adult failure to thrive    Anxiety    Anxiety    Chronic kidney disease, stage 3 (HCC)    Depression    HTN (hypertension)    Hypothyroidism    Hypothyroidism    Retention of urine    Past Surgical History:  Past Surgical History:  Procedure Laterality Date   ABDOMINAL HYSTERECTOMY     TOTAL HIP ARTHROPLASTY Left 04/10/2023   Procedure: ANTERIOR APPROACH TOTAL  HIP ARTHROPLASTY;  Surgeon: Cammy Copa, MD;  Location: [facility] OR;  Service: Orthopedics;  Laterality: Left;   HPI:  This 89 yrs old female with PMhx significant of possible dementia, CKD stage IIIa, hypertension, hypothyroidism, dysphagia, anxiety, depression, recent falls and was in SNF for rehabilitation, presented [facility] ED [facility] from [facility] after fall and left hip pain which occurred 9/27.  X-ray shows left subcapital femur fracture.  Surgery 9/28. Prior clinical swallow eval this year WNL.    Assessment / Plan / Recommendation  Clinical Impression  Pt demonstrates no signs of dysphagia. She is drowsy at time of assessment and needed total assisted feeding but function was appropriate. Pt will need basic precautions like upright posture and slow rate. No SLP f/u needed. SLP Visit Diagnosis: Dysphagia, unspecified (R13.10)    Aspiration Risk  Mild aspiration risk    Diet Recommendation Regular;Thin liquid    Liquid Administration via: Cup;Straw Medication Administration: Whole meds with liquid Supervision: Staff to  assist with self feeding;Full supervision/cueing for compensatory strategies Compensations: Slow rate;Small sips/bites;Minimize environmental distractions Postural Changes: Seated upright at 90 degrees    Other  Recommendations Oral Care Recommendations: Oral care BID    Recommendations for follow up therapy are one component of a multi-disciplinary discharge planning process, led by the attending physician.  Recommendations may be updated based on patient status, additional functional criteria and insurance authorization.  Follow up Recommendations No SLP follow up      Assistance Recommended at Discharge    Functional Status Assessment    Frequency and Duration            Prognosis        Swallow Study   General HPI: This 89 yrs old female with PMhx significant of possible dementia, CKD stage IIIa, hypertension, hypothyroidism, dysphagia, anxiety, depression, recent falls and was in SNF for rehabilitation, presented to ED [facility] after fall and left hip pain which occurred 9/27.  X-ray shows left subcapital femur fracture.  Surgery 9/28. Prior clinical swallow eval this year WNL. Type of Study: MBS-Modified Barium Swallow Study Previous Swallow Assessment: BSE 2/24 Diet Prior to this Study: Regular;Thin liquids (Level 0) Temperature Spikes Noted: No Respiratory Status: Room air History of Recent Intubation: No Behavior/Cognition: Alert;Requires cueing Oral Cavity Assessment: Within Functional Limits Oral Care Completed by SLP: No Oral Cavity - Dentition: Adequate natural dentition Self-Feeding Abilities: Needs assist Patient Positioning: Upright in bed Baseline Vocal Quality: Normal Volitional Cough: Strong Volitional Swallow: Able to elicit    Oral/Motor/Sensory Function  Overall Oral Motor/Sensory Function: Within functional limits   Ice Chips     Thin Liquid Thin Liquid: Within functional limits Presentation: Straw    Nectar Thick Nectar Thick Liquid: Not tested    Honey Thick Honey Thick Liquid: Not tested   Puree Puree: Within functional limits   Solid     Solid: Within functional limits      Charlotte Brafford, Riley Nearing 04/13/2023,11:20 AM

## 2023-04-13 NOTE — TOC Progression Note (Signed)
Transition of Care [facility]) - Progression Note    Patient Details  Name: Kimberly Frye MRN: [id number] Date of Birth: [birth year]  Transition of [facility]) CM/SW Contact  Lorri Frederick, LCSW Phone Number: 04/13/2023, 11:04 AM  Clinical Narrative:  CSW spoke with pt daughter Kimberly Frye.  She is requesting additional SNF facilities: [facility], [facility] [address] (they have talked to them about potential ALF), [facility], [facility], [facility], [facility], [facility].  She had approximate dates of prior SNF stays: [facility]: 6/14-7/30 [facility]: 8/26-9/27.  She is aware that pt is nearing the end of her 100 days of SNF benefit under medicare.  They are prepared to self pay at that point.      Expected Discharge Plan: Skilled Nursing Facility Barriers to Discharge: Continued Medical Work up  Expected Discharge Plan and Services   Discharge Planning Services: CM Consult   Living arrangements for the past 2 months: Skilled Nursing Facility                                       Social Determinants of Health (SDOH) Interventions SDOH Screenings   Food Insecurity: No Food Insecurity (12/22/2022)  Housing: Low Risk  (12/22/2022)  Transportation Needs: No Transportation Needs (12/22/2022)  Utilities: Not At Risk (12/22/2022)  Tobacco Use: Low Risk  (04/10/2023)  Recent Concern: Tobacco Use - Medium Risk (02/26/2023)   Received from [facility]    Readmission Risk Interventions     No data to display

## 2023-04-13 NOTE — Progress Notes (Signed)
PROGRESS NOTE    Kimberly Frye  YQI:[id number] DOB: [birth year] DOA: 04/09/2023 PCP: Fatima Sanger, FNP   Brief Narrative:  This 89 yrs old female with PMhx significant of possible dementia, CKD stage IIIa, hypertension, hypothyroidism, dysphagia, anxiety, depression, recent falls and was in SNF for rehabilitation, presented to ED after fall and left hip pain which occurred 9/27.  X-ray shows left subcapital femur fracture.  Patient admitted for further evaluation,  orthopedics is consulted.  Patient will need left hip hemiarthroplasty versus total hip replacement. She reports in pain which is reasonably controlled now.   Assessment & Plan:   Principal Problem:   Hip fracture (HCC) Active Problems:   Closed subcapital fracture of femur, left, initial encounter (HCC)   Femur fracture (HCC)  Left subcapital femur fracture,  status post mechanical fall: Patient presented status post mechanical fall. X-ray showed left subcapital femur fracture. Orthopedics Dr. August Saucer consulted. Status post ORIF.  POD # 3. Continue DVT prophylaxis postoperatively. -PT/OT eval postop> SNF Continue adequate pain control with oxycodone. Continue vitamin D and calcium carbonate.  Dementia with delirium: -Delirium precautions. -Fall precautions.   Chronic medical problems: Dysphagia:  Speech and swallow evaluation. Anxiety, depression: Continue home sertraline. Hypothyroidism: Continue levothyroxine. Emphysema: DuoNebs as needed   DVT prophylaxis: SCDs Code Status: DNR Family Communication:  No family at bed side. Disposition Plan:   Status is: Inpatient Remains inpatient appropriate because: Admitted s/p mechanical fall with left subcapital femur fracture s/p ORIF.  POD 3.  Patient tolerated well.   PT and OT recommended SNF.    Consultants:  Orthopaedics  Procedures: s/p ORIF POD 3  Antimicrobials:     Anti-infectives (From admission, onward)    Start     Dose/Rate Route  Frequency Ordered Stop   04/10/23 1600  ceFAZolin (ANCEF) IVPB 2g/100 mL premix        2 g 200 mL/hr over 30 Minutes Intravenous Every 6 hours 04/10/23 1554 04/10/23 2138   04/10/23 1319  vancomycin (VANCOCIN) powder  Status:  Discontinued          As needed 04/10/23 1320 04/10/23 1421   04/10/23 1055  ceFAZolin (ANCEF) 2-4 GM/100ML-% IVPB       Note to Pharmacy: Aquilla Hacker M: cabinet override      04/10/23 1055 04/10/23 2259   04/10/23 1030  ceFAZolin (ANCEF) IVPB 2g/100 mL premix  Status:  Discontinued        2 g 200 mL/hr over 30 Minutes Intravenous On call to O.R. 04/10/23 1020 04/10/23 1554      Subjective: Patient was seen and examined at bedside.  Overnight events noted.   Patient reports doing much better,  She still reports having pain in the left femur area. She is awaiting SNF placement.  Insurance authorization is pending  Objective: Vitals:   04/12/23 1529 04/12/23 2039 04/13/23 0520 04/13/23 0746  BP: (!) 133/50 (!) 149/58 127/71 134/66  Pulse: 83 86 71 75  Resp: 19 15 14 15   Temp: 98 F (36.7 C) 97.9 F (36.6 C) 97.7 F (36.5 C) 98 F (36.7 C)  TempSrc: Oral Oral Oral Oral  SpO2: 97% 100% 97% 99%  Weight:      Height:        Intake/Output Summary (Last 24 hours) at 04/13/2023 1308 Last data filed at 04/13/2023 1100 Gross per 24 hour  Intake 103 ml  Output 500 ml  Net -397 ml   Filed Weights   04/09/23 1739  Weight: 45.4 kg  Examination:  General exam: Appears comfortable, deconditioned, not in any acute distress, elderly female. Respiratory system: CTA bilaterally,  Respiratory effort normal.  RR 14 Cardiovascular system: S1 & S2 heard, RRR. No Murmer. No pedal edema. Gastrointestinal system: Abdomen is non distended, soft and non tender. Normal bowel sounds heard. Central nervous system: Alert and oriented x 2. No focal neurological deficits. Extremities: No edema, tenderness noted in the left thigh. s/p ORIF POD 3 Skin: No rashes, lesions  or ulcers Psychiatry:. Mood & affect appropriate.     Data Reviewed: I have personally reviewed following labs and imaging studies  CBC: Recent Labs  Lab 04/09/23 2137 04/10/23 0357 04/11/23 1405 04/12/23 0600 04/13/23 0346  WBC 10.7* 9.7 13.6* 12.5* 12.2*  NEUTROABS 8.4*  --  11.4*  --   --   HGB 11.4* 11.3* 9.6* 9.2* 9.6*  HCT 33.9* 34.4* 28.6* 27.6* 28.2*  MCV 86.7 89.8 86.9 87.3 86.5  PLT 249 260 259 243 276   Basic Metabolic Panel: Recent Labs  Lab 04/09/23 2137 04/10/23 0357 04/11/23 1405 04/12/23 0600 04/13/23 0346  NA 136 136 134* 132* 134*  K 4.2 3.3* 3.5 3.8 3.4*  CL 99 100 99 101 97*  CO2 24 26 24 23 25   GLUCOSE 108* 103* 123* 99 91  BUN 32* 29* 18 17 16   CREATININE 0.96 0.89 0.95 0.93 0.75  CALCIUM 10.0 9.5 9.0 8.7* 9.0  MG  --  1.6* 1.5* 1.7 1.5*  PHOS  --  2.4* 2.4* 2.4* 3.0   GFR: Estimated Creatinine Clearance: 34.8 mL/min (by C-G formula based on SCr of 0.75 mg/dL). Liver Function Tests: Recent Labs  Lab 04/09/23 2137  AST 23  ALT 16  ALKPHOS 78  BILITOT 0.7  PROT 8.0  ALBUMIN 4.4   No results for input(s): "LIPASE", "AMYLASE" in the last 168 hours. No results for input(s): "AMMONIA" in the last 168 hours. Coagulation Profile: No results for input(s): "INR", "PROTIME" in the last 168 hours. Cardiac Enzymes: Recent Labs  Lab 04/09/23 2137  CKTOTAL 83   BNP (last 3 results) No results for input(s): "PROBNP" in the last 8760 hours. HbA1C: No results for input(s): "HGBA1C" in the last 72 hours. CBG: No results for input(s): "GLUCAP" in the last 168 hours. Lipid Profile: No results for input(s): "CHOL", "HDL", "LDLCALC", "TRIG", "CHOLHDL", "LDLDIRECT" in the last 72 hours. Thyroid Function Tests: No results for input(s): "TSH", "T4TOTAL", "FREET4", "T3FREE", "THYROIDAB" in the last 72 hours. Anemia Panel: No results for input(s): "VITAMINB12", "FOLATE", "FERRITIN", "TIBC", "IRON", "RETICCTPCT" in the last 72 hours. Sepsis  Labs: No results for input(s): "PROCALCITON", "LATICACIDVEN" in the last 168 hours.  Recent Results (from the past 240 hour(s))  Surgical pcr screen     Status: None   Collection Time: 04/10/23 10:57 AM   Specimen: Nasal Mucosa; Nasal Swab  Result Value Ref Range Status   MRSA, PCR NEGATIVE NEGATIVE Final   Staphylococcus aureus NEGATIVE NEGATIVE Final    Comment: (NOTE) The Xpert SA Assay (FDA approved for NASAL specimens in patients 85 years of age and older), is one component of a comprehensive surveillance program. It is not intended to diagnose infection nor to guide or monitor treatment. Performed at [facility], [address], [place], [place] [address]     Radiology Studies: No results found.  Scheduled Meds:  aspirin EC  81 mg Oral BID   calcium carbonate  1 tablet Oral BID WC   cholecalciferol  1,000 Units Oral Daily  docusate sodium  100 mg Oral BID   levothyroxine  75 mcg Oral Q0600   sertraline  100 mg Oral Daily   sodium chloride flush  3 mL Intravenous Q12H   Continuous Infusions:     LOS: 3 days    Time spent: 35 mins    Willeen Niece, MD [facility]   If 7PM-7AM, please contact night-coverage

## 2023-04-13 NOTE — Care Management Important Message (Signed)
Important Message  Patient Details  Name: Kimberly Frye MRN: [id number] Date of Birth: 04/14/1933   Important Message Given:  Yes - Medicare IM     Sherilyn Banker 04/13/2023, 3:45 PM

## 2023-04-13 NOTE — Progress Notes (Signed)
Physical Therapy Treatment Patient Details Name: Kimberly Frye MRN: [id number] DOB: February 05, 1933 Today's Date: 04/13/2023   History of Present Illness Kimberly Frye is a 89 y.o. femalewith recent falls and was in SNF for rehabilitation, presented to ED [facility] after fall and left hip pain which occurred 9/27. Had actually been discharged from SNF and was being taken to ALF when RN staff there noted pain and inability to ambulate; s/p L THA, ost Prec, WBAT;  with hx of possible dementia, CKD stage III, hypertension, hypothyroidism, dysphagia (on bite sized diet), anxiety, depression,    PT Comments  Continuing work on functional mobility and activity tolerance;  Pt napping upon PT arrival, but awakened easily and participating well; Able to incr gait distance despite pain L hip; Overall progressing well; Anticipate continuing good progress at post-acute rehabilitation; Collaborated with Kayla, Mobility Specialist, to post large, clear signs with post prec in her room     If plan is discharge home, recommend the following: A lot of help with walking and/or transfers;A lot of help with bathing/dressing/bathroom   Can travel by private vehicle     No  Equipment Recommendations  Rolling walker (2 wheels);BSC/3in1    Recommendations for Other Services OT consult;Other (comment) (worth considering Palliative Medicine consult)     Precautions / Restrictions Precautions Precautions: Fall;Posterior Hip Precaution Booklet Issued: Yes (comment) Precaution Comments: Provided education re: post hip prec to pt, though not sure that she would be able to recall; Described Post Hip Precautions to pt's family in detail on the phone Restrictions LLE Weight Bearing: Weight bearing as tolerated     Mobility  Bed Mobility Overal bed mobility: Needs Assistance Bed Mobility: Supine to Sit     Supine to sit: Mod assist     General bed mobility comments: Pt able to perform bridging of hips to EOB;  some soreness L hip, but using it well with bridging; close guard for post hip precautions; Mod assist to elevate trunk to sit    Transfers Overall transfer level: Needs assistance Equipment used: Rolling walker (2 wheels) Transfers: Sit to/from Stand Sit to Stand: Mod assist           General transfer comment: Light mod assist to power up and steady; cues for LLE placement to minimize hip flexion; min assist to control descent to sit into recliner    Ambulation/Gait Ambulation/Gait assistance: Min assist, +2 safety/equipment (chair follow) Gait Distance (Feet): 20 Feet Assistive device: Rolling walker (2 wheels) Gait Pattern/deviations: Step-to pattern, Decreased stance time - left, Trunk flexed, Antalgic       General Gait Details: Cues for gait sequence and to bear weight through UEs on RW to unweigh painful L hip in stance; slightly better upright posture today   Stairs             Wheelchair Mobility     Tilt Bed    Modified Rankin (Stroke Patients Only)       Balance     Sitting balance-Leahy Scale: Fair       Standing balance-Leahy Scale: Poor                              Cognition Arousal: Alert Behavior During Therapy: WFL for tasks assessed/performed Overall Cognitive Status: History of cognitive impairments - at baseline  Exercises Total Joint Exercises Ankle Circles/Pumps: AROM, Both, 5 reps Heel Slides: AROM, Left, 10 reps Hip ABduction/ADduction: AROM, Left, 10 reps    General Comments General comments (skin integrity, edema, etc.): Was napping upon arrival; but awakened easily, and participating well      Pertinent Vitals/Pain Pain Assessment Pain Assessment: Faces Faces Pain Scale: Hurts even more Pain Location: LLE with transfers and weight bearing Pain Descriptors / Indicators: Guarding, Grimacing Pain Intervention(s): Monitored during session    Home  Living                          Prior Function            PT Goals (current goals can now be found in the care plan section) Acute Rehab PT Goals Patient Stated Goal: Did not specifically state, but agreeable to OOB PT Goal Formulation: Patient unable to participate in goal setting Time For Goal Achievement: 04/25/23 Potential to Achieve Goals: Good Progress towards PT goals: Progressing toward goals    Frequency    Min 1X/week      PT Plan      Co-evaluation              AM-PAC PT "6 Clicks" Mobility   Outcome Measure  Help needed turning from your back to your side while in a flat bed without using bedrails?: A Lot Help needed moving from lying on your back to sitting on the side of a flat bed without using bedrails?: A Lot Help needed moving to and from a bed to a chair (including a wheelchair)?: A Lot Help needed standing up from a chair using your arms (e.g., wheelchair or bedside chair)?: A Lot Help needed to walk in hospital room?: A Lot Help needed climbing 3-5 steps with a railing? : Total 6 Click Score: 11    End of Session Equipment Utilized During Treatment: Gait belt Activity Tolerance: Patient tolerated treatment well Patient left: in chair;with call bell/phone within reach;with chair alarm set Nurse Communication: Mobility status PT Visit Diagnosis: Unsteadiness on feet (R26.81);Other abnormalities of gait and mobility (R26.89);Repeated falls (R29.6);Muscle weakness (generalized) (M62.81);Pain Pain - Right/Left: Left Pain - part of body: Hip     Time: 1133-1203 PT Time Calculation (min) (ACUTE ONLY): 30 min  Charges:    $Gait Training: 8-22 mins $Therapeutic Activity: 8-22 mins PT General Charges $$ ACUTE PT VISIT: 1 Visit                     Van Clines, PT  Acute Rehabilitation Services Office [phone] Secure Chat welcomed    Kimberly Frye 04/13/2023, 5:14 PM

## 2023-04-14 DIAGNOSIS — Z7982 Long term (current) use of aspirin: Secondary | ICD-10-CM | POA: Diagnosis not present

## 2023-04-14 DIAGNOSIS — S72012D Unspecified intracapsular fracture of left femur, subsequent encounter for closed fracture with routine healing: Secondary | ICD-10-CM | POA: Diagnosis not present

## 2023-04-14 DIAGNOSIS — E440 Moderate protein-calorie malnutrition: Secondary | ICD-10-CM | POA: Diagnosis not present

## 2023-04-14 DIAGNOSIS — U071 COVID-19: Secondary | ICD-10-CM | POA: Diagnosis not present

## 2023-04-14 DIAGNOSIS — S72002D Fracture of unspecified part of neck of left femur, subsequent encounter for closed fracture with routine healing: Secondary | ICD-10-CM | POA: Diagnosis not present

## 2023-04-14 DIAGNOSIS — I129 Hypertensive chronic kidney disease with stage 1 through stage 4 chronic kidney disease, or unspecified chronic kidney disease: Secondary | ICD-10-CM | POA: Diagnosis not present

## 2023-04-14 DIAGNOSIS — Z471 Aftercare following joint replacement surgery: Secondary | ICD-10-CM | POA: Diagnosis not present

## 2023-04-14 DIAGNOSIS — Z96642 Presence of left artificial hip joint: Secondary | ICD-10-CM | POA: Diagnosis not present

## 2023-04-14 DIAGNOSIS — S72009A Fracture of unspecified part of neck of unspecified femur, initial encounter for closed fracture: Secondary | ICD-10-CM | POA: Diagnosis not present

## 2023-04-14 DIAGNOSIS — F32A Depression, unspecified: Secondary | ICD-10-CM | POA: Diagnosis not present

## 2023-04-14 DIAGNOSIS — F0284 Dementia in other diseases classified elsewhere, unspecified severity, with anxiety: Secondary | ICD-10-CM | POA: Diagnosis not present

## 2023-04-14 DIAGNOSIS — R41841 Cognitive communication deficit: Secondary | ICD-10-CM | POA: Diagnosis not present

## 2023-04-14 DIAGNOSIS — Z9181 History of falling: Secondary | ICD-10-CM | POA: Diagnosis not present

## 2023-04-14 DIAGNOSIS — R1310 Dysphagia, unspecified: Secondary | ICD-10-CM | POA: Diagnosis not present

## 2023-04-14 DIAGNOSIS — Z681 Body mass index (BMI) 19 or less, adult: Secondary | ICD-10-CM | POA: Diagnosis not present

## 2023-04-14 DIAGNOSIS — F0283 Dementia in other diseases classified elsewhere, unspecified severity, with mood disturbance: Secondary | ICD-10-CM | POA: Diagnosis not present

## 2023-04-14 DIAGNOSIS — Z743 Need for continuous supervision: Secondary | ICD-10-CM | POA: Diagnosis not present

## 2023-04-14 DIAGNOSIS — R0982 Postnasal drip: Secondary | ICD-10-CM | POA: Diagnosis not present

## 2023-04-14 DIAGNOSIS — F419 Anxiety disorder, unspecified: Secondary | ICD-10-CM | POA: Diagnosis not present

## 2023-04-14 DIAGNOSIS — R296 Repeated falls: Secondary | ICD-10-CM | POA: Diagnosis not present

## 2023-04-14 DIAGNOSIS — E039 Hypothyroidism, unspecified: Secondary | ICD-10-CM | POA: Diagnosis not present

## 2023-04-14 DIAGNOSIS — R262 Difficulty in walking, not elsewhere classified: Secondary | ICD-10-CM | POA: Diagnosis not present

## 2023-04-14 DIAGNOSIS — N183 Chronic kidney disease, stage 3 unspecified: Secondary | ICD-10-CM | POA: Diagnosis not present

## 2023-04-14 DIAGNOSIS — S72002A Fracture of unspecified part of neck of left femur, initial encounter for closed fracture: Secondary | ICD-10-CM | POA: Diagnosis not present

## 2023-04-14 DIAGNOSIS — K5900 Constipation, unspecified: Secondary | ICD-10-CM | POA: Diagnosis not present

## 2023-04-14 DIAGNOSIS — M6281 Muscle weakness (generalized): Secondary | ICD-10-CM | POA: Diagnosis not present

## 2023-04-14 LAB — BASIC METABOLIC PANEL
Anion gap: 10 (ref 5–15)
BUN: 23 mg/dL (ref 8–23)
CO2: 23 mmol/L (ref 22–32)
Calcium: 9.1 mg/dL (ref 8.9–10.3)
Chloride: 101 mmol/L (ref 98–111)
Creatinine, Ser: 0.89 mg/dL (ref 0.44–1.00)
GFR, Estimated: >60 mL/min (ref >60)
Glucose, Bld: 98 mg/dL (ref 70–99)
Potassium: 3.9 mmol/L (ref 3.5–5.1)
Sodium: 134 mmol/L — ABNORMAL LOW (ref 135–145)

## 2023-04-14 LAB — CBC
HCT: 30 % — ABNORMAL LOW (ref 36.0–46.0)
Hemoglobin: 9.7 g/dL — ABNORMAL LOW (ref 12.0–15.0)
MCH: 28.8 pg (ref 26.0–34.0)
MCHC: 32.3 g/dL (ref 30.0–36.0)
MCV: 89 fL (ref 80.0–100.0)
Platelets: 320 10*3/uL (ref 150–400)
RBC: 3.37 MIL/uL — ABNORMAL LOW (ref 3.87–5.11)
RDW: 13.8 % (ref 11.5–15.5)
WBC: 10.1 10*3/uL (ref 4.0–10.5)
nRBC: 0 % (ref 0.0–0.2)

## 2023-04-14 LAB — MAGNESIUM: Magnesium: 1.8 mg/dL (ref 1.7–2.4)

## 2023-04-14 MED ORDER — ASPIRIN 81 MG PO TBEC: 81.0000 mg | DELAYED_RELEASE_TABLET | Freq: Two times a day (BID) | ORAL | Status: AC

## 2023-04-14 MED ORDER — POLYETHYLENE GLYCOL 3350 17 G PO PACK: 17.0000 g | PACK | Freq: Every day | ORAL | Status: AC

## 2023-04-14 MED ORDER — SENNA 8.6 MG PO TABS: 1.0000 | ORAL_TABLET | Freq: Two times a day (BID) | ORAL | Status: AC

## 2023-04-14 MED ORDER — OXYCODONE HCL 5 MG PO TABS: 2.5000 mg | ORAL_TABLET | Freq: Four times a day (QID) | ORAL | 0 refills | Status: AC | PRN

## 2023-04-14 MED ORDER — CALCIUM CARBONATE 1250 (500 CA) MG PO TABS: 1.0000 | ORAL_TABLET | Freq: Two times a day (BID) | ORAL | Status: AC

## 2023-04-14 MED ORDER — LIDOCAINE 4 % EX PTCH: 1.0000 | MEDICATED_PATCH | CUTANEOUS | Status: AC

## 2023-04-14 MED ORDER — VITAMIN D3 25 MCG PO TABS: 1000.0000 [IU] | ORAL_TABLET | Freq: Every day | ORAL | Status: AC

## 2023-04-14 NOTE — Plan of Care (Signed)

## 2023-04-14 NOTE — TOC Progression Note (Signed)
Transition of Care [facility]) - Progression Note    Patient Details  Name: Kimberly Frye MRN: [id number] Date of Birth: 02-19-1933  [facility] CM/SW Contact  Lorri Frederick, LCSW Phone Number: 04/14/2023, 11:28 AM  Clinical Narrative:   CSW spoke with Linnell Fulling, provided current bed offers.  She and pt son Onalee Hua are most interested in a SNF that can transition pt to LTC after STR.  She requested CSW reach out to [facility], [facility], [facility] to see if they can offer this.  CSW reached out to all three.  CSW received responses from all three places and non have LTC beds currently.  1000: CSW spoke with Darl Pikes again, provided this update.  They would like to accept offer at [facility].  CSW informed [facility].  She can receive pt today at 2pm or after.    MD informed.     Expected Discharge Plan: Skilled Nursing Facility Barriers to Discharge: Continued Medical Work up  Expected Discharge Plan and Services   Discharge Planning Services: CM Consult   Living arrangements for the past 2 months: Skilled Nursing Facility Expected Discharge Date: 04/14/23                                     Social Determinants of Health (SDOH) Interventions SDOH Screenings   Food Insecurity: No Food Insecurity (12/22/2022)  Housing: Low Risk  (12/22/2022)  Transportation Needs: No Transportation Needs (12/22/2022)  Utilities: Not At Risk (12/22/2022)  Tobacco Use: Low Risk  (04/10/2023)  Recent Concern: Tobacco Use - Medium Risk (02/26/2023)   Received from [facility]    Readmission Risk Interventions     No data to display

## 2023-04-14 NOTE — Discharge Summary (Addendum)
Physician Discharge Summary  Kimberly Frye OZD:[id number] DOB: 02-Mar-1933 DOA: 04/09/2023  PCP: Fatima Sanger, FNP  Admit date: 04/09/2023 Discharge date: 04/14/2023  Admitted From: Home Disposition:  Home  Discharge Condition:Stable CODE STATUS:FULL Diet recommendation:  Regular  Brief/Interim Summary:   Following problems were addressed during the hospitalization:  89 yrs old female with PMhx significant of possible dementia, CKD stage IIIa, hypertension, hypothyroidism, dysphagia, anxiety, depression, recent falls and was in SNF for rehabilitation, presented to ED after fall and left hip pain which occurred 9/27.  X-ray showed left subcapital femur fracture.  Orthopedics consulted, status post ORIF.  PT/OT recommending SNF on discharge.  Medically stable for discharge to SNF  Discharge Diagnoses:  Principal Problem:   Hip fracture (HCC) Active Problems:   Closed subcapital fracture of femur, left, initial encounter (HCC)   Femur fracture (HCC)  Left subcapital femur fracture,  status post mechanical fall: Patient presented status post mechanical fall. X-ray showed left subcapital femur fracture. Orthopedics Dr. August Saucer consulted. Status post ORIF.  POD # 4. Continue DVT prophylaxis with aspirin 81 mg twice daily for 28 days postoperatively. -PT/OT eval postop> SNF Continue adequate pain control with oxycodone. Continue vitamin D and calcium carbonate. Follow-up with orthopedics in 2 weeks Continue bowel regimen   Dementia with delirium: -Delirium precautions. -Fall precautions.  Discharge Instructions  Discharge Instructions     Diet general   Complete by: As directed    Discharge instructions   Complete by: As directed    1)Please take prescribed medications as instructed 2)Follow up with orthopedics as an outpatient in 2 weeks for x-rays and wound check.  Name and number of the provider has been attached   Increase activity slowly   Complete by: As  directed    No wound care   Complete by: As directed       Allergies as of 04/14/2023       Reactions   Bystolic [nebivolol Hcl] Swelling   Leg edema   Celexa [citalopram Hydrobromide] Other (See Comments)   "In a daze"   Penicillins Other (See Comments)   Unknown reaction        Medication List     TAKE these medications    amLODipine 2.5 MG tablet Commonly known as: NORVASC Take 1 tablet (2.5 mg total) by mouth daily. What changed: how much to take   aspirin EC 81 MG tablet Take 1 tablet (81 mg total) by mouth 2 (two) times daily for 28 days. Swallow whole.   calcium carbonate 1250 (500 Ca) MG tablet Commonly known as: OS-CAL - dosed in mg of elemental calcium Take 1 tablet (1,250 mg total) by mouth 2 (two) times daily with a meal.   diclofenac Sodium 1 % Gel Commonly known as: VOLTAREN Apply 2 g topically every 6 (six) hours as needed.   lidocaine 4 % Place 1 patch onto the skin daily.   oxyCODONE 5 MG immediate release tablet Commonly known as: Oxy IR/ROXICODONE Take 0.5 tablets (2.5 mg total) by mouth every 6 (six) hours as needed for severe pain.   polyethylene glycol 17 g packet Commonly known as: MiraLax Take 17 g by mouth daily.   senna 8.6 MG Tabs tablet Commonly known as: SENOKOT Take 1 tablet (8.6 mg total) by mouth in the morning and at bedtime.   sertraline 100 MG tablet Commonly known as: ZOLOFT Take 100 mg by mouth daily.   Synthroid 75 MCG tablet Generic drug: levothyroxine Take 75 mcg by mouth  daily.   vitamin D3 25 MCG tablet Commonly known as: CHOLECALCIFEROL Take 1 tablet (1,000 Units total) by mouth daily. Start taking on: April 15, 2023   ZyrTEC Allergy 10 MG tablet Generic drug: cetirizine Take 10 mg by mouth daily as needed for allergies.        Contact information for follow-up providers     August Saucer, Corrie Mckusick, MD. Schedule an appointment as soon as possible for a visit in 2 week(s).   Specialty: Orthopedic  Surgery Contact information: [address] [place] [address] [phone]              Contact information for after-discharge care     Destination     [facility] .   Service: Skilled Nursing Contact information: [address] [place] [address] [phone]                    Allergies  Allergen Reactions   Bystolic Sharman Cheek Hcl] Swelling    Leg edema   Celexa [Citalopram Hydrobromide] Other (See Comments)    "In a daze"   Penicillins Other (See Comments)    Unknown reaction    Consultations: Orthopedics   Procedures/Studies: DG HIP UNILAT W OR W/O PELVIS 2-3 VIEWS LEFT  Result Date: 04/10/2023 CLINICAL DATA:  Left hip replacement EXAM: DG HIP (WITH OR WITHOUT PELVIS) 2-3V LEFT COMPARISON:  Intraoperative films from earlier in the same day. FINDINGS: Left hip prosthesis is noted in satisfactory position. No acute bony abnormality is noted. Degenerative changes of the right hip joint and lumbar spine are seen. No soft tissue abnormality is noted. IMPRESSION: Status post left hip replacement. Electronically Signed   By: Alcide Clever M.D.   On: 04/10/2023 19:15   DG HIP UNILAT WITH PELVIS 2-3 VIEWS LEFT  Result Date: 04/10/2023 CLINICAL DATA:  Elective surgery. EXAM: DG HIP (WITH OR WITHOUT PELVIS) 2-3V LEFT COMPARISON:  None Available. FINDINGS: Eleven fluoroscopic spot views of the pelvis and left hip obtained in the operating room. Sequential images during hip arthroplasty. Fluoroscopy time 35.7 seconds. Dose 3.19 mGy. IMPRESSION: Intraoperative fluoroscopy for left hip arthroplasty. Electronically Signed   By: Narda Rutherford M.D.   On: 04/10/2023 14:34   DG C-Arm 1-60 Min-No Report  Result Date: 04/10/2023 Fluoroscopy was utilized by the requesting physician.  No radiographic interpretation.   DG C-Arm 1-60 Min-No Report  Result Date: 04/10/2023 Fluoroscopy was utilized by the requesting physician.  No  radiographic interpretation.   CT THORACIC SPINE WO CONTRAST  Result Date: 04/09/2023 CLINICAL DATA:  Fall found on floor EXAM: CT THORACIC SPINE WITHOUT CONTRAST TECHNIQUE: Multidetector CT images of the thoracic were obtained using the standard protocol without intravenous contrast. RADIATION DOSE REDUCTION: This exam was performed according to the departmental dose-optimization program which includes automated exposure control, adjustment of the mA and/or kV according to patient size and/or use of iterative reconstruction technique. COMPARISON:  None Available. FINDINGS: Alignment: Scoliosis of the lower thoracic spine. Sagittal alignment is within normal limits. Vertebrae: Normal vertebral stature. No fracture. Lucent lesion in the T9 vertebral body with some coarse trabecula, potentially a hemangioma. Paraspinal and other soft tissues: Emphysema. No acute paravertebral or paraspinal soft tissue abnormality. Aortic calcification and coronary vascular calcification. Disc levels: Multilevel degenerative osteophytes. IMPRESSION: 1. No CT evidence for acute osseous abnormality. 2. Scoliosis and degenerative change. Aortic Atherosclerosis (ICD10-I70.0) and Emphysema (ICD10-J43.9). Electronically Signed   By: Jasmine Pang M.D.   On: 04/09/2023 22:55  CT Head Wo Contrast  Result Date: 04/09/2023 CLINICAL DATA:  Head trauma fall EXAM: CT HEAD WITHOUT CONTRAST CT CERVICAL SPINE WITHOUT CONTRAST TECHNIQUE: Multidetector CT imaging of the head and cervical spine was performed following the standard protocol without intravenous contrast. Multiplanar CT image reconstructions of the cervical spine were also generated. RADIATION DOSE REDUCTION: This exam was performed according to the departmental dose-optimization program which includes automated exposure control, adjustment of the mA and/or kV according to patient size and/or use of iterative reconstruction technique. COMPARISON:  MRI 12/24/2022, CT brain  12/22/2019 FINDINGS: CT HEAD FINDINGS Brain: No acute territorial infarction, hemorrhage or intracranial mass. Atrophy and chronic small vessel ischemic changes of the white matter. Stable ventricle size Vascular: No hyperdense vessels. Vertebral and carotid vascular calcification Skull: Normal. Negative for fracture or focal lesion. Sinuses/Orbits: No acute finding. Other: None CT CERVICAL SPINE FINDINGS Alignment: Reversal of lower cervical lordosis. No subluxation. Facet alignment is maintained. Skull base and vertebrae: No acute fracture. No primary bone lesion or focal pathologic process. Soft tissues and spinal canal: No prevertebral fluid or swelling. No visible canal hematoma. Disc levels: Advanced degenerative changes C4 through C7. Multilevel facet degenerative change with foraminal narrowing. Upper chest: Apical emphysema Other: None IMPRESSION: 1. No CT evidence for acute intracranial abnormality. Atrophy and chronic small vessel ischemic changes of the white matter. 2. Reversal of cervical lordosis with multilevel degenerative change. No acute osseous abnormality. Electronically Signed   By: Jasmine Pang M.D.   On: 04/09/2023 22:50   CT Cervical Spine Wo Contrast  Result Date: 04/09/2023 CLINICAL DATA:  Head trauma fall EXAM: CT HEAD WITHOUT CONTRAST CT CERVICAL SPINE WITHOUT CONTRAST TECHNIQUE: Multidetector CT imaging of the head and cervical spine was performed following the standard protocol without intravenous contrast. Multiplanar CT image reconstructions of the cervical spine were also generated. RADIATION DOSE REDUCTION: This exam was performed according to the departmental dose-optimization program which includes automated exposure control, adjustment of the mA and/or kV according to patient size and/or use of iterative reconstruction technique. COMPARISON:  MRI 12/24/2022, CT brain 12/22/2019 FINDINGS: CT HEAD FINDINGS Brain: No acute territorial infarction, hemorrhage or intracranial  mass. Atrophy and chronic small vessel ischemic changes of the white matter. Stable ventricle size Vascular: No hyperdense vessels. Vertebral and carotid vascular calcification Skull: Normal. Negative for fracture or focal lesion. Sinuses/Orbits: No acute finding. Other: None CT CERVICAL SPINE FINDINGS Alignment: Reversal of lower cervical lordosis. No subluxation. Facet alignment is maintained. Skull base and vertebrae: No acute fracture. No primary bone lesion or focal pathologic process. Soft tissues and spinal canal: No prevertebral fluid or swelling. No visible canal hematoma. Disc levels: Advanced degenerative changes C4 through C7. Multilevel facet degenerative change with foraminal narrowing. Upper chest: Apical emphysema Other: None IMPRESSION: 1. No CT evidence for acute intracranial abnormality. Atrophy and chronic small vessel ischemic changes of the white matter. 2. Reversal of cervical lordosis with multilevel degenerative change. No acute osseous abnormality. Electronically Signed   By: Jasmine Pang M.D.   On: 04/09/2023 22:50   DG Chest Portable 1 View  Result Date: 04/09/2023 CLINICAL DATA:  Fall EXAM: PORTABLE CHEST 1 VIEW COMPARISON:  03/14/2008 FINDINGS: Emphysema and bronchitic changes. No acute airspace disease or effusion. Normal cardiac size with aortic atherosclerosis. No pneumothorax. IMPRESSION: Emphysema and bronchitic changes. Electronically Signed   By: Jasmine Pang M.D.   On: 04/09/2023 22:19   DG Elbow 2 Views Left  Result Date: 04/09/2023 CLINICAL DATA:  Fall with elbow pain  EXAM: LEFT ELBOW - 2 VIEW COMPARISON:  None Available. FINDINGS: There is no evidence of fracture, dislocation, or joint effusion. There is no evidence of arthropathy or other focal bone abnormality. Soft tissues are unremarkable. IMPRESSION: Negative. Electronically Signed   By: Jasmine Pang M.D.   On: 04/09/2023 22:18   DG Hip Unilat With Pelvis 2-3 Views Left  Result Date: 04/09/2023 CLINICAL  DATA:  Fall, left hip pain. EXAM: DG HIP (WITH OR WITHOUT PELVIS) 2-3V LEFT COMPARISON:  None Available. FINDINGS: There is a subcapital impaction fracture of the proximal left femur. The remaining bony structures are intact and there is no dislocation. Vascular calcifications are noted in the pelvis and lower extremities bilaterally. Degenerative changes are present at the right hip and lumbar spine. IMPRESSION: Subcapital impaction fracture of the proximal left femur. Electronically Signed   By: Thornell Sartorius M.D.   On: 04/09/2023 20:34      Subjective: Patient seen and examined at bedside today.  Comfortable, lying in bed.  She is pleasantly confused.  Lab work and vitals reviewed.  Has a bed at SNF today.Hemodynamically stable for discharge.  Discharge Exam: Vitals:   04/14/23 0458 04/14/23 0734  BP: (!) 144/72 (!) 110/51  Pulse: 76 87  Resp: 18 17  Temp: 97.7 F (36.5 C) 98 F (36.7 C)  SpO2: 94% 98%   Vitals:   04/13/23 1511 04/13/23 2047 04/14/23 0458 04/14/23 0734  BP: (!) 116/49 131/60 (!) 144/72 (!) 110/51  Pulse: 73 79 76 87  Resp: 15 18 18 17   Temp: 97.7 F (36.5 C) 98.3 F (36.8 C) 97.7 F (36.5 C) 98 F (36.7 C)  TempSrc: Oral Oral Oral Oral  SpO2: 97% 96% 94% 98%  Weight:      Height:        General: Pt is alert, awake, not in acute distress Cardiovascular: RRR, S1/S2 +, no rubs, no gallops Respiratory: CTA bilaterally, no wheezing, no rhonchi Abdominal: Soft, NT, ND, bowel sounds + Extremities: no edema, no cyanosis    The results of significant diagnostics from this hospitalization (including imaging, microbiology, ancillary and laboratory) are listed below for reference.     Microbiology: Recent Results (from the past 240 hour(s))  Surgical pcr screen     Status: None   Collection Time: 04/10/23 10:57 AM   Specimen: Nasal Mucosa; Nasal Swab  Result Value Ref Range Status   MRSA, PCR NEGATIVE NEGATIVE Final   Staphylococcus aureus NEGATIVE NEGATIVE  Final    Comment: (NOTE) The Xpert SA Assay (FDA approved for NASAL specimens in patients 27 years of age and older), is one component of a comprehensive surveillance program. It is not intended to diagnose infection nor to guide or monitor treatment. Performed at [facility], [address] [place], [place] [address]      Labs: BNP (last 3 results) No results for input(s): "BNP" in the last 8760 hours. Basic Metabolic Panel: Recent Labs  Lab 04/10/23 0357 04/11/23 1405 04/12/23 0600 04/13/23 0346 04/14/23 0603  NA 136 134* 132* 134* 134*  K 3.3* 3.5 3.8 3.4* 3.9  CL 100 99 101 97* 101  CO2 26 24 23 25 23   GLUCOSE 103* 123* 99 91 98  BUN 29* 18 17 16 23   CREATININE 0.89 0.95 0.93 0.75 0.89  CALCIUM 9.5 9.0 8.7* 9.0 9.1  MG 1.6* 1.5* 1.7 1.5* 1.8  PHOS 2.4* 2.4* 2.4* 3.0  --    Liver Function Tests: Recent Labs  Lab  04/09/23 2137  AST 23  ALT 16  ALKPHOS 78  BILITOT 0.7  PROT 8.0  ALBUMIN 4.4   No results for input(s): "LIPASE", "AMYLASE" in the last 168 hours. No results for input(s): "AMMONIA" in the last 168 hours. CBC: Recent Labs  Lab 04/09/23 2137 04/10/23 0357 04/11/23 1405 04/12/23 0600 04/13/23 0346 04/14/23 0603  WBC 10.7* 9.7 13.6* 12.5* 12.2* 10.1  NEUTROABS 8.4*  --  11.4*  --   --   --   HGB 11.4* 11.3* 9.6* 9.2* 9.6* 9.7*  HCT 33.9* 34.4* 28.6* 27.6* 28.2* 30.0*  MCV 86.7 89.8 86.9 87.3 86.5 89.0  PLT 249 260 259 243 276 320   Cardiac Enzymes: Recent Labs  Lab 04/09/23 2137  CKTOTAL 83   BNP: Invalid input(s): "POCBNP" CBG: No results for input(s): "GLUCAP" in the last 168 hours. D-Dimer No results for input(s): "DDIMER" in the last 72 hours. Hgb A1c No results for input(s): "HGBA1C" in the last 72 hours. Lipid Profile No results for input(s): "CHOL", "HDL", "LDLCALC", "TRIG", "CHOLHDL", "LDLDIRECT" in the last 72 hours. Thyroid function studies No results for input(s): "TSH", "T4TOTAL", "T3FREE", "THYROIDAB" in the  last 72 hours.  Invalid input(s): "FREET3" Anemia work up No results for input(s): "VITAMINB12", "FOLATE", "FERRITIN", "TIBC", "IRON", "RETICCTPCT" in the last 72 hours. Urinalysis    Component Value Date/Time   COLORURINE COLORLESS (A) 12/22/2022 1039   APPEARANCEUR CLEAR 12/22/2022 1039   LABSPEC 1.011 12/22/2022 1039   PHURINE 6.5 12/22/2022 1039   GLUCOSEU NEGATIVE 12/22/2022 1039   HGBUR NEGATIVE 12/22/2022 1039   BILIRUBINUR NEGATIVE 12/22/2022 1039   BILIRUBINUR negative 12/21/2022 1926   KETONESUR NEGATIVE 12/22/2022 1039   PROTEINUR NEGATIVE 12/22/2022 1039   UROBILINOGEN 0.2 12/21/2022 1926   NITRITE NEGATIVE 12/22/2022 1039   LEUKOCYTESUR NEGATIVE 12/22/2022 1039   Sepsis Labs Recent Labs  Lab 04/11/23 1405 04/12/23 0600 04/13/23 0346 04/14/23 0603  WBC 13.6* 12.5* 12.2* 10.1   Microbiology Recent Results (from the past 240 hour(s))  Surgical pcr screen     Status: None   Collection Time: 04/10/23 10:57 AM   Specimen: Nasal Mucosa; Nasal Swab  Result Value Ref Range Status   MRSA, PCR NEGATIVE NEGATIVE Final   Staphylococcus aureus NEGATIVE NEGATIVE Final    Comment: (NOTE) The Xpert SA Assay (FDA approved for NASAL specimens in patients 46 years of age and older), is one component of a comprehensive surveillance program. It is not intended to diagnose infection nor to guide or monitor treatment. Performed at [facility], [address] [place], [place] [address]     Please note: You were cared for by a hospitalist during your hospital stay. Once you are discharged, your primary care physician will handle any further medical issues. Please note that NO REFILLS for any discharge medications will be authorized once you are discharged, as it is imperative that you return to your primary care physician (or establish a relationship with a primary care physician if you do not have one) for your post hospital discharge needs so that they can reassess your  need for medications and monitor your lab values.    Time coordinating discharge: 40 minutes  SIGNED:   Burnadette Pop, MD  [facility] 04/14/2023, 11:35 AM Pager [phone]  If 7PM-7AM, please contact night-coverage [email or web address] Password [email or web address]

## 2023-04-14 NOTE — TOC Transition Note (Signed)
Transition of Care [facility]) - CM/SW Discharge Note   Patient Details  Name: Kimberly Frye MRN: [id number] Date of Birth: Jun 30, 1933  [facility] CM/SW Contact:  Lorri Frederick, LCSW Phone Number: 04/14/2023, 11:32 AM   Clinical Narrative:   Pt discharging to [facility], room 408.  RN call report to [phone].  [facility] has been scheduled for 1pm.      Final next level of care: Skilled Nursing Facility Barriers to Discharge: Barriers Resolved   Patient Goals and CMS Choice   Choice offered to / list presented to : Adult Children  Discharge Placement                Patient chooses bed at: [facility] Patient to be transferred to facility by: [facility] Name of family member notified: DIL Darl Pikes Patient and family notified of of transfer: 04/14/23  Discharge Plan and Services Additional resources added to the After Visit Summary for     Discharge Planning Services: CM Consult                                 Social Determinants of Health (SDOH) Interventions SDOH Screenings   Food Insecurity: No Food Insecurity (12/22/2022)  Housing: Low Risk  (12/22/2022)  Transportation Needs: No Transportation Needs (12/22/2022)  Utilities: Not At Risk (12/22/2022)  Tobacco Use: Low Risk  (04/10/2023)  Recent Concern: Tobacco Use - Medium Risk (02/26/2023)   Received from [facility]     Readmission Risk Interventions     No data to display

## 2023-04-14 NOTE — Progress Notes (Signed)
Patient discharged, report called and given to [facility] at [facility]. No additional questions and or concerns at this time. Patient IV removed per protocol. Tammy Sours to call family with update and time of expected transportation to facility. Patient expected pickup time is 1pm from [facility].

## 2023-04-16 DIAGNOSIS — F32A Depression, unspecified: Secondary | ICD-10-CM | POA: Diagnosis not present

## 2023-04-16 DIAGNOSIS — S72012D Unspecified intracapsular fracture of left femur, subsequent encounter for closed fracture with routine healing: Secondary | ICD-10-CM | POA: Diagnosis not present

## 2023-04-16 DIAGNOSIS — E039 Hypothyroidism, unspecified: Secondary | ICD-10-CM | POA: Diagnosis not present

## 2023-04-16 DIAGNOSIS — K5900 Constipation, unspecified: Secondary | ICD-10-CM | POA: Diagnosis not present

## 2023-04-16 DIAGNOSIS — Z7982 Long term (current) use of aspirin: Secondary | ICD-10-CM | POA: Diagnosis not present

## 2023-04-16 DIAGNOSIS — E440 Moderate protein-calorie malnutrition: Secondary | ICD-10-CM | POA: Diagnosis not present

## 2023-04-16 DIAGNOSIS — F0283 Dementia in other diseases classified elsewhere, unspecified severity, with mood disturbance: Secondary | ICD-10-CM | POA: Diagnosis not present

## 2023-04-16 DIAGNOSIS — R1310 Dysphagia, unspecified: Secondary | ICD-10-CM | POA: Diagnosis not present

## 2023-04-16 DIAGNOSIS — F419 Anxiety disorder, unspecified: Secondary | ICD-10-CM | POA: Diagnosis not present

## 2023-04-16 DIAGNOSIS — R0982 Postnasal drip: Secondary | ICD-10-CM | POA: Diagnosis not present

## 2023-04-16 DIAGNOSIS — Z681 Body mass index (BMI) 19 or less, adult: Secondary | ICD-10-CM | POA: Diagnosis not present

## 2023-04-16 DIAGNOSIS — F0284 Dementia in other diseases classified elsewhere, unspecified severity, with anxiety: Secondary | ICD-10-CM | POA: Diagnosis not present

## 2023-04-16 DIAGNOSIS — I129 Hypertensive chronic kidney disease with stage 1 through stage 4 chronic kidney disease, or unspecified chronic kidney disease: Secondary | ICD-10-CM | POA: Diagnosis not present

## 2023-04-16 DIAGNOSIS — N183 Chronic kidney disease, stage 3 unspecified: Secondary | ICD-10-CM | POA: Diagnosis not present

## 2023-04-16 DIAGNOSIS — Z9181 History of falling: Secondary | ICD-10-CM | POA: Diagnosis not present

## 2023-04-23 ENCOUNTER — Encounter: Payer: Medicare Other | Admitting: Surgical

## 2023-04-29 DIAGNOSIS — U071 COVID-19: Secondary | ICD-10-CM | POA: Diagnosis not present

## 2023-05-10 DIAGNOSIS — F32A Depression, unspecified: Secondary | ICD-10-CM | POA: Diagnosis not present

## 2023-05-10 DIAGNOSIS — S72012D Unspecified intracapsular fracture of left femur, subsequent encounter for closed fracture with routine healing: Secondary | ICD-10-CM | POA: Diagnosis not present

## 2023-05-10 DIAGNOSIS — J309 Allergic rhinitis, unspecified: Secondary | ICD-10-CM | POA: Diagnosis not present

## 2023-05-10 DIAGNOSIS — F0393 Unspecified dementia, unspecified severity, with mood disturbance: Secondary | ICD-10-CM | POA: Diagnosis not present

## 2023-05-10 DIAGNOSIS — Z9181 History of falling: Secondary | ICD-10-CM | POA: Diagnosis not present

## 2023-05-17 DIAGNOSIS — D631 Anemia in chronic kidney disease: Secondary | ICD-10-CM | POA: Diagnosis not present

## 2023-05-17 DIAGNOSIS — Z9181 History of falling: Secondary | ICD-10-CM | POA: Diagnosis not present

## 2023-05-17 DIAGNOSIS — I129 Hypertensive chronic kidney disease with stage 1 through stage 4 chronic kidney disease, or unspecified chronic kidney disease: Secondary | ICD-10-CM | POA: Diagnosis not present

## 2023-05-17 DIAGNOSIS — E039 Hypothyroidism, unspecified: Secondary | ICD-10-CM | POA: Diagnosis not present

## 2023-05-17 DIAGNOSIS — K5900 Constipation, unspecified: Secondary | ICD-10-CM | POA: Diagnosis not present

## 2023-05-17 DIAGNOSIS — N189 Chronic kidney disease, unspecified: Secondary | ICD-10-CM | POA: Diagnosis not present

## 2023-05-17 DIAGNOSIS — F32A Depression, unspecified: Secondary | ICD-10-CM | POA: Diagnosis not present

## 2023-05-17 DIAGNOSIS — S72012D Unspecified intracapsular fracture of left femur, subsequent encounter for closed fracture with routine healing: Secondary | ICD-10-CM | POA: Diagnosis not present

## 2023-05-19 ENCOUNTER — Encounter: Payer: Medicare Other | Admitting: Orthopedic Surgery

## 2023-05-19 ENCOUNTER — Other Ambulatory Visit (INDEPENDENT_AMBULATORY_CARE_PROVIDER_SITE_OTHER): Payer: Self-pay

## 2023-05-19 ENCOUNTER — Ambulatory Visit: Payer: Medicare Other | Admitting: Orthopedic Surgery

## 2023-05-19 DIAGNOSIS — S72002A Fracture of unspecified part of neck of left femur, initial encounter for closed fracture: Secondary | ICD-10-CM

## 2023-05-22 ENCOUNTER — Encounter: Payer: Self-pay | Admitting: Orthopedic Surgery

## 2023-05-22 NOTE — Progress Notes (Signed)
Post-Op Visit Note   Patient: Kimberly Frye           Date of Birth: 1933-04-30           MRN: [id number] Visit Date: 05/19/2023 PCP: Fatima Sanger, FNP   Assessment & Plan:  Chief Complaint:  Chief Complaint  Patient presents with   Left Hip - Routine Post Op    04/10/23 left THA for fracture   Visit Diagnoses:  1. Closed fracture of left hip, initial encounter [facility])     Plan: Kimberly Frye is an 89 year old patient underwent left total hip replacement for fracture on 04/10/2023.  She has had some falls in rehab.  She is using a walker.  She is very active.  On examination she has excellent hip flexion abduction and adduction strength with no groin pain with internal/external Tatian of the left hip.  Incision intact.  2 remnant nylon sutures removed.  Radiographs look good.  She is going to continue with weightbearing as tolerated in the left hip with a walker.  She will follow-up with [place] as needed.  Follow-Up Instructions: No follow-ups on file.   Orders:  Orders Placed This Encounter  Procedures   XR HIP UNILAT W OR W/O PELVIS 2-3 VIEWS LEFT   No orders of the defined types were placed in this encounter.   Imaging: No results found.  PMFS History: Patient Active Problem List   Diagnosis Date Noted   Hip fracture (HCC) 04/10/2023   Closed subcapital fracture of femur, left, initial encounter (HCC) 04/10/2023   Femur fracture (HCC) 04/10/2023   Dementia due to Alzheimer's disease (HCC) 03/18/2023   Cognitive impairment 01/02/2023   Stage 3b chronic kidney disease (HCC) 01/02/2023   Cerebral atrophy (HCC) 01/02/2023   Major depressive disorder, single episode, severe with psychotic features (HCC) 01/02/2023   Moderate protein-calorie malnutrition (HCC) 01/02/2023   AKI (acute kidney injury) (HCC) 12/23/2022   Orthostatic hypotension 12/23/2022   AMS (altered mental status) 12/22/2022   Encephalopathy 12/22/2022   Abdominal aortic atherosclerosis (HCC)  10/21/2020   Adjustment disorder 10/21/2020   Allergic rhinitis 10/21/2020   Chronic kidney disease due to hypertension 10/21/2020   Essential hypertension 10/21/2020   History of malignant neoplasm of skin 10/21/2020   Hyperlipidemia 10/21/2020   Hypothyroidism 10/21/2020   Menopausal flushing 10/21/2020   Mild anxiety 10/21/2020   Mixed anxiety and depressive disorder 10/21/2020   Pure hypercholesterolemia 10/21/2020   Tobacco user 10/21/2020   Vitamin D deficiency 10/21/2020   Past Medical History:  Diagnosis Date   Abnormal barium swallow    done at [facility], family stated test showed water would go down the wrong way,went thru speech therapy, mechanical diet, food neds to be chopped up.   Adult failure to thrive    Anxiety    Anxiety    Chronic kidney disease, stage 3 (HCC)    Depression    HTN (hypertension)    Hypothyroidism    Hypothyroidism    Retention of urine     No family history on file.  Past Surgical History:  Procedure Laterality Date   ABDOMINAL HYSTERECTOMY     TOTAL HIP ARTHROPLASTY Left 04/10/2023   Procedure: ANTERIOR APPROACH TOTAL  HIP ARTHROPLASTY;  Surgeon: Cammy Copa, MD;  Location: [facility] OR;  Service: Orthopedics;  Laterality: Left;   Social History   Occupational History   Not on file  Tobacco Use   Smoking status: Never    Passive exposure: Never  Smokeless tobacco: Never  Vaping Use   Vaping status: Never Used  Substance and Sexual Activity   Alcohol use: Yes    Comment: occasional   Drug use: Never   Sexual activity: Not on file

## 2023-05-25 DIAGNOSIS — D509 Iron deficiency anemia, unspecified: Secondary | ICD-10-CM | POA: Diagnosis not present

## 2023-05-26 DIAGNOSIS — S72012D Unspecified intracapsular fracture of left femur, subsequent encounter for closed fracture with routine healing: Secondary | ICD-10-CM | POA: Diagnosis not present

## 2023-05-26 DIAGNOSIS — D509 Iron deficiency anemia, unspecified: Secondary | ICD-10-CM | POA: Diagnosis not present

## 2023-05-26 DIAGNOSIS — F0390 Unspecified dementia without behavioral disturbance: Secondary | ICD-10-CM | POA: Diagnosis not present

## 2023-05-26 DIAGNOSIS — N390 Urinary tract infection, site not specified: Secondary | ICD-10-CM | POA: Diagnosis not present

## 2023-05-27 DIAGNOSIS — D72829 Elevated white blood cell count, unspecified: Secondary | ICD-10-CM | POA: Diagnosis not present

## 2023-05-28 DIAGNOSIS — D509 Iron deficiency anemia, unspecified: Secondary | ICD-10-CM | POA: Diagnosis not present

## 2023-05-28 DIAGNOSIS — F0390 Unspecified dementia without behavioral disturbance: Secondary | ICD-10-CM | POA: Diagnosis not present

## 2023-05-28 DIAGNOSIS — Z9181 History of falling: Secondary | ICD-10-CM | POA: Diagnosis not present

## 2023-05-28 DIAGNOSIS — S72012D Unspecified intracapsular fracture of left femur, subsequent encounter for closed fracture with routine healing: Secondary | ICD-10-CM | POA: Diagnosis not present

## 2023-05-28 DIAGNOSIS — S0083XD Contusion of other part of head, subsequent encounter: Secondary | ICD-10-CM | POA: Diagnosis not present

## 2023-06-01 DIAGNOSIS — D649 Anemia, unspecified: Secondary | ICD-10-CM | POA: Diagnosis not present

## 2023-06-05 DIAGNOSIS — N390 Urinary tract infection, site not specified: Secondary | ICD-10-CM | POA: Diagnosis not present

## 2023-06-08 ENCOUNTER — Ambulatory Visit: Payer: Medicare Other | Admitting: Neurology

## 2023-06-15 DIAGNOSIS — N390 Urinary tract infection, site not specified: Secondary | ICD-10-CM | POA: Diagnosis not present

## 2023-06-16 ENCOUNTER — Ambulatory Visit: Payer: Medicare Other | Admitting: Neurology

## 2023-06-28 DIAGNOSIS — R296 Repeated falls: Secondary | ICD-10-CM | POA: Diagnosis not present

## 2023-06-28 DIAGNOSIS — G309 Alzheimer's disease, unspecified: Secondary | ICD-10-CM | POA: Diagnosis not present

## 2023-06-28 DIAGNOSIS — M6281 Muscle weakness (generalized): Secondary | ICD-10-CM | POA: Diagnosis not present

## 2023-06-28 DIAGNOSIS — R2681 Unsteadiness on feet: Secondary | ICD-10-CM | POA: Diagnosis not present

## 2023-06-28 DIAGNOSIS — R279 Unspecified lack of coordination: Secondary | ICD-10-CM | POA: Diagnosis not present

## 2023-06-28 DIAGNOSIS — Z471 Aftercare following joint replacement surgery: Secondary | ICD-10-CM | POA: Diagnosis not present

## 2023-06-28 DIAGNOSIS — M810 Age-related osteoporosis without current pathological fracture: Secondary | ICD-10-CM | POA: Diagnosis not present

## 2023-06-28 DIAGNOSIS — R52 Pain, unspecified: Secondary | ICD-10-CM | POA: Diagnosis not present

## 2023-06-28 DIAGNOSIS — M1991 Primary osteoarthritis, unspecified site: Secondary | ICD-10-CM | POA: Diagnosis not present

## 2023-06-29 DIAGNOSIS — R296 Repeated falls: Secondary | ICD-10-CM | POA: Diagnosis not present

## 2023-06-29 DIAGNOSIS — G309 Alzheimer's disease, unspecified: Secondary | ICD-10-CM | POA: Diagnosis not present

## 2023-06-29 DIAGNOSIS — M6281 Muscle weakness (generalized): Secondary | ICD-10-CM | POA: Diagnosis not present

## 2023-06-29 DIAGNOSIS — R2681 Unsteadiness on feet: Secondary | ICD-10-CM | POA: Diagnosis not present

## 2023-06-29 DIAGNOSIS — M810 Age-related osteoporosis without current pathological fracture: Secondary | ICD-10-CM | POA: Diagnosis not present

## 2023-06-29 DIAGNOSIS — M1991 Primary osteoarthritis, unspecified site: Secondary | ICD-10-CM | POA: Diagnosis not present

## 2023-06-30 DIAGNOSIS — M6281 Muscle weakness (generalized): Secondary | ICD-10-CM | POA: Diagnosis not present

## 2023-06-30 DIAGNOSIS — R2681 Unsteadiness on feet: Secondary | ICD-10-CM | POA: Diagnosis not present

## 2023-06-30 DIAGNOSIS — G309 Alzheimer's disease, unspecified: Secondary | ICD-10-CM | POA: Diagnosis not present

## 2023-06-30 DIAGNOSIS — M810 Age-related osteoporosis without current pathological fracture: Secondary | ICD-10-CM | POA: Diagnosis not present

## 2023-06-30 DIAGNOSIS — M1991 Primary osteoarthritis, unspecified site: Secondary | ICD-10-CM | POA: Diagnosis not present

## 2023-06-30 DIAGNOSIS — R296 Repeated falls: Secondary | ICD-10-CM | POA: Diagnosis not present

## 2023-07-01 DIAGNOSIS — M1991 Primary osteoarthritis, unspecified site: Secondary | ICD-10-CM | POA: Diagnosis not present

## 2023-07-01 DIAGNOSIS — G309 Alzheimer's disease, unspecified: Secondary | ICD-10-CM | POA: Diagnosis not present

## 2023-07-01 DIAGNOSIS — R296 Repeated falls: Secondary | ICD-10-CM | POA: Diagnosis not present

## 2023-07-01 DIAGNOSIS — M810 Age-related osteoporosis without current pathological fracture: Secondary | ICD-10-CM | POA: Diagnosis not present

## 2023-07-01 DIAGNOSIS — R2681 Unsteadiness on feet: Secondary | ICD-10-CM | POA: Diagnosis not present

## 2023-07-01 DIAGNOSIS — M6281 Muscle weakness (generalized): Secondary | ICD-10-CM | POA: Diagnosis not present

## 2023-07-02 DIAGNOSIS — M1991 Primary osteoarthritis, unspecified site: Secondary | ICD-10-CM | POA: Diagnosis not present

## 2023-07-02 DIAGNOSIS — R2681 Unsteadiness on feet: Secondary | ICD-10-CM | POA: Diagnosis not present

## 2023-07-02 DIAGNOSIS — M6281 Muscle weakness (generalized): Secondary | ICD-10-CM | POA: Diagnosis not present

## 2023-07-02 DIAGNOSIS — R296 Repeated falls: Secondary | ICD-10-CM | POA: Diagnosis not present

## 2023-07-02 DIAGNOSIS — G309 Alzheimer's disease, unspecified: Secondary | ICD-10-CM | POA: Diagnosis not present

## 2023-07-02 DIAGNOSIS — M810 Age-related osteoporosis without current pathological fracture: Secondary | ICD-10-CM | POA: Diagnosis not present

## 2023-07-05 DIAGNOSIS — R296 Repeated falls: Secondary | ICD-10-CM | POA: Diagnosis not present

## 2023-07-05 DIAGNOSIS — G309 Alzheimer's disease, unspecified: Secondary | ICD-10-CM | POA: Diagnosis not present

## 2023-07-05 DIAGNOSIS — M6281 Muscle weakness (generalized): Secondary | ICD-10-CM | POA: Diagnosis not present

## 2023-07-05 DIAGNOSIS — M810 Age-related osteoporosis without current pathological fracture: Secondary | ICD-10-CM | POA: Diagnosis not present

## 2023-07-05 DIAGNOSIS — R2681 Unsteadiness on feet: Secondary | ICD-10-CM | POA: Diagnosis not present

## 2023-07-05 DIAGNOSIS — M1991 Primary osteoarthritis, unspecified site: Secondary | ICD-10-CM | POA: Diagnosis not present

## 2023-07-06 DIAGNOSIS — M1991 Primary osteoarthritis, unspecified site: Secondary | ICD-10-CM | POA: Diagnosis not present

## 2023-07-06 DIAGNOSIS — M810 Age-related osteoporosis without current pathological fracture: Secondary | ICD-10-CM | POA: Diagnosis not present

## 2023-07-06 DIAGNOSIS — R296 Repeated falls: Secondary | ICD-10-CM | POA: Diagnosis not present

## 2023-07-06 DIAGNOSIS — G309 Alzheimer's disease, unspecified: Secondary | ICD-10-CM | POA: Diagnosis not present

## 2023-07-06 DIAGNOSIS — R2681 Unsteadiness on feet: Secondary | ICD-10-CM | POA: Diagnosis not present

## 2023-07-06 DIAGNOSIS — M6281 Muscle weakness (generalized): Secondary | ICD-10-CM | POA: Diagnosis not present

## 2023-07-08 DIAGNOSIS — R2681 Unsteadiness on feet: Secondary | ICD-10-CM | POA: Diagnosis not present

## 2023-07-08 DIAGNOSIS — M6281 Muscle weakness (generalized): Secondary | ICD-10-CM | POA: Diagnosis not present

## 2023-07-08 DIAGNOSIS — M810 Age-related osteoporosis without current pathological fracture: Secondary | ICD-10-CM | POA: Diagnosis not present

## 2023-07-08 DIAGNOSIS — R296 Repeated falls: Secondary | ICD-10-CM | POA: Diagnosis not present

## 2023-07-08 DIAGNOSIS — G309 Alzheimer's disease, unspecified: Secondary | ICD-10-CM | POA: Diagnosis not present

## 2023-07-08 DIAGNOSIS — M1991 Primary osteoarthritis, unspecified site: Secondary | ICD-10-CM | POA: Diagnosis not present

## 2023-07-09 DIAGNOSIS — R296 Repeated falls: Secondary | ICD-10-CM | POA: Diagnosis not present

## 2023-07-09 DIAGNOSIS — M6281 Muscle weakness (generalized): Secondary | ICD-10-CM | POA: Diagnosis not present

## 2023-07-09 DIAGNOSIS — M1991 Primary osteoarthritis, unspecified site: Secondary | ICD-10-CM | POA: Diagnosis not present

## 2023-07-09 DIAGNOSIS — R2681 Unsteadiness on feet: Secondary | ICD-10-CM | POA: Diagnosis not present

## 2023-07-09 DIAGNOSIS — M810 Age-related osteoporosis without current pathological fracture: Secondary | ICD-10-CM | POA: Diagnosis not present

## 2023-07-09 DIAGNOSIS — G309 Alzheimer's disease, unspecified: Secondary | ICD-10-CM | POA: Diagnosis not present

## 2023-07-10 DIAGNOSIS — R296 Repeated falls: Secondary | ICD-10-CM | POA: Diagnosis not present

## 2023-07-10 DIAGNOSIS — G309 Alzheimer's disease, unspecified: Secondary | ICD-10-CM | POA: Diagnosis not present

## 2023-07-10 DIAGNOSIS — M810 Age-related osteoporosis without current pathological fracture: Secondary | ICD-10-CM | POA: Diagnosis not present

## 2023-07-10 DIAGNOSIS — M6281 Muscle weakness (generalized): Secondary | ICD-10-CM | POA: Diagnosis not present

## 2023-07-10 DIAGNOSIS — R2681 Unsteadiness on feet: Secondary | ICD-10-CM | POA: Diagnosis not present

## 2023-07-10 DIAGNOSIS — M1991 Primary osteoarthritis, unspecified site: Secondary | ICD-10-CM | POA: Diagnosis not present

## 2023-07-12 ENCOUNTER — Encounter: Payer: Self-pay | Admitting: Physician Assistant

## 2023-07-12 DIAGNOSIS — M810 Age-related osteoporosis without current pathological fracture: Secondary | ICD-10-CM | POA: Diagnosis not present

## 2023-07-12 DIAGNOSIS — R296 Repeated falls: Secondary | ICD-10-CM | POA: Diagnosis not present

## 2023-07-12 DIAGNOSIS — M1991 Primary osteoarthritis, unspecified site: Secondary | ICD-10-CM | POA: Diagnosis not present

## 2023-07-12 DIAGNOSIS — R2681 Unsteadiness on feet: Secondary | ICD-10-CM | POA: Diagnosis not present

## 2023-07-12 DIAGNOSIS — M6281 Muscle weakness (generalized): Secondary | ICD-10-CM | POA: Diagnosis not present

## 2023-07-12 DIAGNOSIS — G309 Alzheimer's disease, unspecified: Secondary | ICD-10-CM | POA: Diagnosis not present

## 2023-07-13 DIAGNOSIS — M6281 Muscle weakness (generalized): Secondary | ICD-10-CM | POA: Diagnosis not present

## 2023-07-13 DIAGNOSIS — R2681 Unsteadiness on feet: Secondary | ICD-10-CM | POA: Diagnosis not present

## 2023-07-13 DIAGNOSIS — M1991 Primary osteoarthritis, unspecified site: Secondary | ICD-10-CM | POA: Diagnosis not present

## 2023-07-13 DIAGNOSIS — M810 Age-related osteoporosis without current pathological fracture: Secondary | ICD-10-CM | POA: Diagnosis not present

## 2023-07-13 DIAGNOSIS — G309 Alzheimer's disease, unspecified: Secondary | ICD-10-CM | POA: Diagnosis not present

## 2023-07-13 DIAGNOSIS — R296 Repeated falls: Secondary | ICD-10-CM | POA: Diagnosis not present

## 2023-07-14 DIAGNOSIS — R279 Unspecified lack of coordination: Secondary | ICD-10-CM | POA: Diagnosis not present

## 2023-07-14 DIAGNOSIS — M810 Age-related osteoporosis without current pathological fracture: Secondary | ICD-10-CM | POA: Diagnosis not present

## 2023-07-14 DIAGNOSIS — R296 Repeated falls: Secondary | ICD-10-CM | POA: Diagnosis not present

## 2023-07-14 DIAGNOSIS — M6281 Muscle weakness (generalized): Secondary | ICD-10-CM | POA: Diagnosis not present

## 2023-07-14 DIAGNOSIS — G309 Alzheimer's disease, unspecified: Secondary | ICD-10-CM | POA: Diagnosis not present

## 2023-07-14 DIAGNOSIS — R2681 Unsteadiness on feet: Secondary | ICD-10-CM | POA: Diagnosis not present

## 2023-07-14 DIAGNOSIS — M1991 Primary osteoarthritis, unspecified site: Secondary | ICD-10-CM | POA: Diagnosis not present

## 2023-07-14 DIAGNOSIS — R1310 Dysphagia, unspecified: Secondary | ICD-10-CM | POA: Diagnosis not present

## 2023-07-14 DIAGNOSIS — R1312 Dysphagia, oropharyngeal phase: Secondary | ICD-10-CM | POA: Diagnosis not present

## 2023-07-14 DIAGNOSIS — R41841 Cognitive communication deficit: Secondary | ICD-10-CM | POA: Diagnosis not present

## 2023-07-14 DIAGNOSIS — Z471 Aftercare following joint replacement surgery: Secondary | ICD-10-CM | POA: Diagnosis not present

## 2023-07-15 DIAGNOSIS — M1991 Primary osteoarthritis, unspecified site: Secondary | ICD-10-CM | POA: Diagnosis not present

## 2023-07-15 DIAGNOSIS — M810 Age-related osteoporosis without current pathological fracture: Secondary | ICD-10-CM | POA: Diagnosis not present

## 2023-07-15 DIAGNOSIS — R296 Repeated falls: Secondary | ICD-10-CM | POA: Diagnosis not present

## 2023-07-15 DIAGNOSIS — M6281 Muscle weakness (generalized): Secondary | ICD-10-CM | POA: Diagnosis not present

## 2023-07-15 DIAGNOSIS — R2681 Unsteadiness on feet: Secondary | ICD-10-CM | POA: Diagnosis not present

## 2023-07-15 DIAGNOSIS — G309 Alzheimer's disease, unspecified: Secondary | ICD-10-CM | POA: Diagnosis not present

## 2023-07-16 DIAGNOSIS — G309 Alzheimer's disease, unspecified: Secondary | ICD-10-CM | POA: Diagnosis not present

## 2023-07-16 DIAGNOSIS — M1991 Primary osteoarthritis, unspecified site: Secondary | ICD-10-CM | POA: Diagnosis not present

## 2023-07-16 DIAGNOSIS — M6281 Muscle weakness (generalized): Secondary | ICD-10-CM | POA: Diagnosis not present

## 2023-07-16 DIAGNOSIS — M810 Age-related osteoporosis without current pathological fracture: Secondary | ICD-10-CM | POA: Diagnosis not present

## 2023-07-16 DIAGNOSIS — R2681 Unsteadiness on feet: Secondary | ICD-10-CM | POA: Diagnosis not present

## 2023-07-16 DIAGNOSIS — R296 Repeated falls: Secondary | ICD-10-CM | POA: Diagnosis not present

## 2023-07-19 DIAGNOSIS — R296 Repeated falls: Secondary | ICD-10-CM | POA: Diagnosis not present

## 2023-07-19 DIAGNOSIS — M6281 Muscle weakness (generalized): Secondary | ICD-10-CM | POA: Diagnosis not present

## 2023-07-19 DIAGNOSIS — M810 Age-related osteoporosis without current pathological fracture: Secondary | ICD-10-CM | POA: Diagnosis not present

## 2023-07-19 DIAGNOSIS — M1991 Primary osteoarthritis, unspecified site: Secondary | ICD-10-CM | POA: Diagnosis not present

## 2023-07-19 DIAGNOSIS — R2681 Unsteadiness on feet: Secondary | ICD-10-CM | POA: Diagnosis not present

## 2023-07-19 DIAGNOSIS — G309 Alzheimer's disease, unspecified: Secondary | ICD-10-CM | POA: Diagnosis not present

## 2023-07-20 DIAGNOSIS — M810 Age-related osteoporosis without current pathological fracture: Secondary | ICD-10-CM | POA: Diagnosis not present

## 2023-07-20 DIAGNOSIS — R296 Repeated falls: Secondary | ICD-10-CM | POA: Diagnosis not present

## 2023-07-20 DIAGNOSIS — M6281 Muscle weakness (generalized): Secondary | ICD-10-CM | POA: Diagnosis not present

## 2023-07-20 DIAGNOSIS — M1991 Primary osteoarthritis, unspecified site: Secondary | ICD-10-CM | POA: Diagnosis not present

## 2023-07-20 DIAGNOSIS — G309 Alzheimer's disease, unspecified: Secondary | ICD-10-CM | POA: Diagnosis not present

## 2023-07-20 DIAGNOSIS — R2681 Unsteadiness on feet: Secondary | ICD-10-CM | POA: Diagnosis not present

## 2023-07-21 DIAGNOSIS — M1991 Primary osteoarthritis, unspecified site: Secondary | ICD-10-CM | POA: Diagnosis not present

## 2023-07-21 DIAGNOSIS — M810 Age-related osteoporosis without current pathological fracture: Secondary | ICD-10-CM | POA: Diagnosis not present

## 2023-07-21 DIAGNOSIS — R2681 Unsteadiness on feet: Secondary | ICD-10-CM | POA: Diagnosis not present

## 2023-07-21 DIAGNOSIS — G309 Alzheimer's disease, unspecified: Secondary | ICD-10-CM | POA: Diagnosis not present

## 2023-07-21 DIAGNOSIS — M6281 Muscle weakness (generalized): Secondary | ICD-10-CM | POA: Diagnosis not present

## 2023-07-21 DIAGNOSIS — R296 Repeated falls: Secondary | ICD-10-CM | POA: Diagnosis not present

## 2023-07-22 DIAGNOSIS — R2681 Unsteadiness on feet: Secondary | ICD-10-CM | POA: Diagnosis not present

## 2023-07-22 DIAGNOSIS — M810 Age-related osteoporosis without current pathological fracture: Secondary | ICD-10-CM | POA: Diagnosis not present

## 2023-07-22 DIAGNOSIS — M1991 Primary osteoarthritis, unspecified site: Secondary | ICD-10-CM | POA: Diagnosis not present

## 2023-07-22 DIAGNOSIS — R296 Repeated falls: Secondary | ICD-10-CM | POA: Diagnosis not present

## 2023-07-22 DIAGNOSIS — G309 Alzheimer's disease, unspecified: Secondary | ICD-10-CM | POA: Diagnosis not present

## 2023-07-22 DIAGNOSIS — M6281 Muscle weakness (generalized): Secondary | ICD-10-CM | POA: Diagnosis not present

## 2023-07-23 DIAGNOSIS — F0280 Dementia in other diseases classified elsewhere without behavioral disturbance: Secondary | ICD-10-CM | POA: Diagnosis not present

## 2023-07-23 DIAGNOSIS — N189 Chronic kidney disease, unspecified: Secondary | ICD-10-CM | POA: Diagnosis not present

## 2023-07-23 DIAGNOSIS — R2681 Unsteadiness on feet: Secondary | ICD-10-CM | POA: Diagnosis not present

## 2023-07-23 DIAGNOSIS — G309 Alzheimer's disease, unspecified: Secondary | ICD-10-CM | POA: Diagnosis not present

## 2023-07-23 DIAGNOSIS — I129 Hypertensive chronic kidney disease with stage 1 through stage 4 chronic kidney disease, or unspecified chronic kidney disease: Secondary | ICD-10-CM | POA: Diagnosis not present

## 2023-07-23 DIAGNOSIS — M810 Age-related osteoporosis without current pathological fracture: Secondary | ICD-10-CM | POA: Diagnosis not present

## 2023-07-23 DIAGNOSIS — R296 Repeated falls: Secondary | ICD-10-CM | POA: Diagnosis not present

## 2023-07-23 DIAGNOSIS — M1991 Primary osteoarthritis, unspecified site: Secondary | ICD-10-CM | POA: Diagnosis not present

## 2023-07-23 DIAGNOSIS — M6281 Muscle weakness (generalized): Secondary | ICD-10-CM | POA: Diagnosis not present

## 2023-07-23 DIAGNOSIS — K5900 Constipation, unspecified: Secondary | ICD-10-CM | POA: Diagnosis not present

## 2023-07-23 DIAGNOSIS — S72012D Unspecified intracapsular fracture of left femur, subsequent encounter for closed fracture with routine healing: Secondary | ICD-10-CM | POA: Diagnosis not present

## 2023-07-23 DIAGNOSIS — Z9181 History of falling: Secondary | ICD-10-CM | POA: Diagnosis not present

## 2023-07-23 DIAGNOSIS — E039 Hypothyroidism, unspecified: Secondary | ICD-10-CM | POA: Diagnosis not present

## 2023-07-23 DIAGNOSIS — D509 Iron deficiency anemia, unspecified: Secondary | ICD-10-CM | POA: Diagnosis not present

## 2023-07-26 DIAGNOSIS — M1991 Primary osteoarthritis, unspecified site: Secondary | ICD-10-CM | POA: Diagnosis not present

## 2023-07-26 DIAGNOSIS — M6281 Muscle weakness (generalized): Secondary | ICD-10-CM | POA: Diagnosis not present

## 2023-07-26 DIAGNOSIS — G309 Alzheimer's disease, unspecified: Secondary | ICD-10-CM | POA: Diagnosis not present

## 2023-07-26 DIAGNOSIS — R634 Abnormal weight loss: Secondary | ICD-10-CM | POA: Diagnosis not present

## 2023-07-26 DIAGNOSIS — R296 Repeated falls: Secondary | ICD-10-CM | POA: Diagnosis not present

## 2023-07-26 DIAGNOSIS — M810 Age-related osteoporosis without current pathological fracture: Secondary | ICD-10-CM | POA: Diagnosis not present

## 2023-07-26 DIAGNOSIS — R2681 Unsteadiness on feet: Secondary | ICD-10-CM | POA: Diagnosis not present

## 2023-08-01 DIAGNOSIS — R2681 Unsteadiness on feet: Secondary | ICD-10-CM | POA: Diagnosis not present

## 2023-08-01 DIAGNOSIS — G309 Alzheimer's disease, unspecified: Secondary | ICD-10-CM | POA: Diagnosis not present

## 2023-08-01 DIAGNOSIS — M810 Age-related osteoporosis without current pathological fracture: Secondary | ICD-10-CM | POA: Diagnosis not present

## 2023-08-01 DIAGNOSIS — M1991 Primary osteoarthritis, unspecified site: Secondary | ICD-10-CM | POA: Diagnosis not present

## 2023-08-01 DIAGNOSIS — R296 Repeated falls: Secondary | ICD-10-CM | POA: Diagnosis not present

## 2023-08-01 DIAGNOSIS — M6281 Muscle weakness (generalized): Secondary | ICD-10-CM | POA: Diagnosis not present

## 2023-08-02 DIAGNOSIS — M6281 Muscle weakness (generalized): Secondary | ICD-10-CM | POA: Diagnosis not present

## 2023-08-02 DIAGNOSIS — G309 Alzheimer's disease, unspecified: Secondary | ICD-10-CM | POA: Diagnosis not present

## 2023-08-02 DIAGNOSIS — R2681 Unsteadiness on feet: Secondary | ICD-10-CM | POA: Diagnosis not present

## 2023-08-02 DIAGNOSIS — R296 Repeated falls: Secondary | ICD-10-CM | POA: Diagnosis not present

## 2023-08-02 DIAGNOSIS — M810 Age-related osteoporosis without current pathological fracture: Secondary | ICD-10-CM | POA: Diagnosis not present

## 2023-08-02 DIAGNOSIS — M1991 Primary osteoarthritis, unspecified site: Secondary | ICD-10-CM | POA: Diagnosis not present

## 2023-08-04 DIAGNOSIS — G309 Alzheimer's disease, unspecified: Secondary | ICD-10-CM | POA: Diagnosis not present

## 2023-08-04 DIAGNOSIS — R296 Repeated falls: Secondary | ICD-10-CM | POA: Diagnosis not present

## 2023-08-04 DIAGNOSIS — R2681 Unsteadiness on feet: Secondary | ICD-10-CM | POA: Diagnosis not present

## 2023-08-04 DIAGNOSIS — M6281 Muscle weakness (generalized): Secondary | ICD-10-CM | POA: Diagnosis not present

## 2023-08-04 DIAGNOSIS — M1991 Primary osteoarthritis, unspecified site: Secondary | ICD-10-CM | POA: Diagnosis not present

## 2023-08-04 DIAGNOSIS — M810 Age-related osteoporosis without current pathological fracture: Secondary | ICD-10-CM | POA: Diagnosis not present

## 2023-08-05 DIAGNOSIS — R296 Repeated falls: Secondary | ICD-10-CM | POA: Diagnosis not present

## 2023-08-05 DIAGNOSIS — G309 Alzheimer's disease, unspecified: Secondary | ICD-10-CM | POA: Diagnosis not present

## 2023-08-05 DIAGNOSIS — M6281 Muscle weakness (generalized): Secondary | ICD-10-CM | POA: Diagnosis not present

## 2023-08-05 DIAGNOSIS — M1991 Primary osteoarthritis, unspecified site: Secondary | ICD-10-CM | POA: Diagnosis not present

## 2023-08-05 DIAGNOSIS — M810 Age-related osteoporosis without current pathological fracture: Secondary | ICD-10-CM | POA: Diagnosis not present

## 2023-08-05 DIAGNOSIS — R2681 Unsteadiness on feet: Secondary | ICD-10-CM | POA: Diagnosis not present

## 2023-08-06 DIAGNOSIS — M810 Age-related osteoporosis without current pathological fracture: Secondary | ICD-10-CM | POA: Diagnosis not present

## 2023-08-06 DIAGNOSIS — Z23 Encounter for immunization: Secondary | ICD-10-CM | POA: Diagnosis not present

## 2023-08-06 DIAGNOSIS — M1991 Primary osteoarthritis, unspecified site: Secondary | ICD-10-CM | POA: Diagnosis not present

## 2023-08-06 DIAGNOSIS — M6281 Muscle weakness (generalized): Secondary | ICD-10-CM | POA: Diagnosis not present

## 2023-08-06 DIAGNOSIS — R296 Repeated falls: Secondary | ICD-10-CM | POA: Diagnosis not present

## 2023-08-06 DIAGNOSIS — G309 Alzheimer's disease, unspecified: Secondary | ICD-10-CM | POA: Diagnosis not present

## 2023-08-06 DIAGNOSIS — R2681 Unsteadiness on feet: Secondary | ICD-10-CM | POA: Diagnosis not present

## 2023-08-09 DIAGNOSIS — G309 Alzheimer's disease, unspecified: Secondary | ICD-10-CM | POA: Diagnosis not present

## 2023-08-09 DIAGNOSIS — M1991 Primary osteoarthritis, unspecified site: Secondary | ICD-10-CM | POA: Diagnosis not present

## 2023-08-09 DIAGNOSIS — M810 Age-related osteoporosis without current pathological fracture: Secondary | ICD-10-CM | POA: Diagnosis not present

## 2023-08-09 DIAGNOSIS — M6281 Muscle weakness (generalized): Secondary | ICD-10-CM | POA: Diagnosis not present

## 2023-08-09 DIAGNOSIS — R296 Repeated falls: Secondary | ICD-10-CM | POA: Diagnosis not present

## 2023-08-09 DIAGNOSIS — R2681 Unsteadiness on feet: Secondary | ICD-10-CM | POA: Diagnosis not present

## 2023-08-10 DIAGNOSIS — R2681 Unsteadiness on feet: Secondary | ICD-10-CM | POA: Diagnosis not present

## 2023-08-10 DIAGNOSIS — M6281 Muscle weakness (generalized): Secondary | ICD-10-CM | POA: Diagnosis not present

## 2023-08-10 DIAGNOSIS — M810 Age-related osteoporosis without current pathological fracture: Secondary | ICD-10-CM | POA: Diagnosis not present

## 2023-08-10 DIAGNOSIS — M1991 Primary osteoarthritis, unspecified site: Secondary | ICD-10-CM | POA: Diagnosis not present

## 2023-08-10 DIAGNOSIS — R296 Repeated falls: Secondary | ICD-10-CM | POA: Diagnosis not present

## 2023-08-10 DIAGNOSIS — G309 Alzheimer's disease, unspecified: Secondary | ICD-10-CM | POA: Diagnosis not present

## 2023-08-11 DIAGNOSIS — M810 Age-related osteoporosis without current pathological fracture: Secondary | ICD-10-CM | POA: Diagnosis not present

## 2023-08-11 DIAGNOSIS — M6281 Muscle weakness (generalized): Secondary | ICD-10-CM | POA: Diagnosis not present

## 2023-08-11 DIAGNOSIS — R2681 Unsteadiness on feet: Secondary | ICD-10-CM | POA: Diagnosis not present

## 2023-08-11 DIAGNOSIS — R296 Repeated falls: Secondary | ICD-10-CM | POA: Diagnosis not present

## 2023-08-11 DIAGNOSIS — G309 Alzheimer's disease, unspecified: Secondary | ICD-10-CM | POA: Diagnosis not present

## 2023-08-11 DIAGNOSIS — M1991 Primary osteoarthritis, unspecified site: Secondary | ICD-10-CM | POA: Diagnosis not present

## 2023-08-12 DIAGNOSIS — M6281 Muscle weakness (generalized): Secondary | ICD-10-CM | POA: Diagnosis not present

## 2023-08-12 DIAGNOSIS — M810 Age-related osteoporosis without current pathological fracture: Secondary | ICD-10-CM | POA: Diagnosis not present

## 2023-08-12 DIAGNOSIS — R2681 Unsteadiness on feet: Secondary | ICD-10-CM | POA: Diagnosis not present

## 2023-08-12 DIAGNOSIS — M1991 Primary osteoarthritis, unspecified site: Secondary | ICD-10-CM | POA: Diagnosis not present

## 2023-08-12 DIAGNOSIS — G309 Alzheimer's disease, unspecified: Secondary | ICD-10-CM | POA: Diagnosis not present

## 2023-08-12 DIAGNOSIS — R296 Repeated falls: Secondary | ICD-10-CM | POA: Diagnosis not present

## 2023-08-13 DIAGNOSIS — M6281 Muscle weakness (generalized): Secondary | ICD-10-CM | POA: Diagnosis not present

## 2023-08-13 DIAGNOSIS — M1991 Primary osteoarthritis, unspecified site: Secondary | ICD-10-CM | POA: Diagnosis not present

## 2023-08-13 DIAGNOSIS — R2681 Unsteadiness on feet: Secondary | ICD-10-CM | POA: Diagnosis not present

## 2023-08-13 DIAGNOSIS — G309 Alzheimer's disease, unspecified: Secondary | ICD-10-CM | POA: Diagnosis not present

## 2023-08-13 DIAGNOSIS — M810 Age-related osteoporosis without current pathological fracture: Secondary | ICD-10-CM | POA: Diagnosis not present

## 2023-08-13 DIAGNOSIS — R296 Repeated falls: Secondary | ICD-10-CM | POA: Diagnosis not present

## 2023-08-16 DIAGNOSIS — R1312 Dysphagia, oropharyngeal phase: Secondary | ICD-10-CM | POA: Diagnosis not present

## 2023-08-16 DIAGNOSIS — M6281 Muscle weakness (generalized): Secondary | ICD-10-CM | POA: Diagnosis not present

## 2023-08-16 DIAGNOSIS — Z741 Need for assistance with personal care: Secondary | ICD-10-CM | POA: Diagnosis not present

## 2023-08-16 DIAGNOSIS — R41841 Cognitive communication deficit: Secondary | ICD-10-CM | POA: Diagnosis not present

## 2023-08-16 DIAGNOSIS — R1310 Dysphagia, unspecified: Secondary | ICD-10-CM | POA: Diagnosis not present

## 2023-08-16 DIAGNOSIS — M1991 Primary osteoarthritis, unspecified site: Secondary | ICD-10-CM | POA: Diagnosis not present

## 2023-08-16 DIAGNOSIS — R2689 Other abnormalities of gait and mobility: Secondary | ICD-10-CM | POA: Diagnosis not present

## 2023-08-16 DIAGNOSIS — M810 Age-related osteoporosis without current pathological fracture: Secondary | ICD-10-CM | POA: Diagnosis not present

## 2023-08-16 DIAGNOSIS — R296 Repeated falls: Secondary | ICD-10-CM | POA: Diagnosis not present

## 2023-08-16 DIAGNOSIS — G309 Alzheimer's disease, unspecified: Secondary | ICD-10-CM | POA: Diagnosis not present

## 2023-08-16 DIAGNOSIS — S72114D Nondisplaced fracture of greater trochanter of right femur, subsequent encounter for closed fracture with routine healing: Secondary | ICD-10-CM | POA: Diagnosis not present

## 2023-08-16 DIAGNOSIS — Z9181 History of falling: Secondary | ICD-10-CM | POA: Diagnosis not present

## 2023-08-17 DIAGNOSIS — M6281 Muscle weakness (generalized): Secondary | ICD-10-CM | POA: Diagnosis not present

## 2023-08-17 DIAGNOSIS — M810 Age-related osteoporosis without current pathological fracture: Secondary | ICD-10-CM | POA: Diagnosis not present

## 2023-08-17 DIAGNOSIS — R1310 Dysphagia, unspecified: Secondary | ICD-10-CM | POA: Diagnosis not present

## 2023-08-17 DIAGNOSIS — G309 Alzheimer's disease, unspecified: Secondary | ICD-10-CM | POA: Diagnosis not present

## 2023-08-17 DIAGNOSIS — M1991 Primary osteoarthritis, unspecified site: Secondary | ICD-10-CM | POA: Diagnosis not present

## 2023-08-17 DIAGNOSIS — R296 Repeated falls: Secondary | ICD-10-CM | POA: Diagnosis not present

## 2023-08-18 DIAGNOSIS — M1991 Primary osteoarthritis, unspecified site: Secondary | ICD-10-CM | POA: Diagnosis not present

## 2023-08-18 DIAGNOSIS — G309 Alzheimer's disease, unspecified: Secondary | ICD-10-CM | POA: Diagnosis not present

## 2023-08-18 DIAGNOSIS — M6281 Muscle weakness (generalized): Secondary | ICD-10-CM | POA: Diagnosis not present

## 2023-08-18 DIAGNOSIS — M810 Age-related osteoporosis without current pathological fracture: Secondary | ICD-10-CM | POA: Diagnosis not present

## 2023-08-18 DIAGNOSIS — R296 Repeated falls: Secondary | ICD-10-CM | POA: Diagnosis not present

## 2023-08-18 DIAGNOSIS — R1310 Dysphagia, unspecified: Secondary | ICD-10-CM | POA: Diagnosis not present

## 2023-08-19 DIAGNOSIS — R1310 Dysphagia, unspecified: Secondary | ICD-10-CM | POA: Diagnosis not present

## 2023-08-19 DIAGNOSIS — R296 Repeated falls: Secondary | ICD-10-CM | POA: Diagnosis not present

## 2023-08-19 DIAGNOSIS — G309 Alzheimer's disease, unspecified: Secondary | ICD-10-CM | POA: Diagnosis not present

## 2023-08-19 DIAGNOSIS — M1991 Primary osteoarthritis, unspecified site: Secondary | ICD-10-CM | POA: Diagnosis not present

## 2023-08-19 DIAGNOSIS — M810 Age-related osteoporosis without current pathological fracture: Secondary | ICD-10-CM | POA: Diagnosis not present

## 2023-08-19 DIAGNOSIS — M6281 Muscle weakness (generalized): Secondary | ICD-10-CM | POA: Diagnosis not present

## 2023-08-20 DIAGNOSIS — M6281 Muscle weakness (generalized): Secondary | ICD-10-CM | POA: Diagnosis not present

## 2023-08-20 DIAGNOSIS — R1310 Dysphagia, unspecified: Secondary | ICD-10-CM | POA: Diagnosis not present

## 2023-08-20 DIAGNOSIS — M1991 Primary osteoarthritis, unspecified site: Secondary | ICD-10-CM | POA: Diagnosis not present

## 2023-08-20 DIAGNOSIS — G309 Alzheimer's disease, unspecified: Secondary | ICD-10-CM | POA: Diagnosis not present

## 2023-08-20 DIAGNOSIS — M810 Age-related osteoporosis without current pathological fracture: Secondary | ICD-10-CM | POA: Diagnosis not present

## 2023-08-20 DIAGNOSIS — R296 Repeated falls: Secondary | ICD-10-CM | POA: Diagnosis not present

## 2023-08-21 ENCOUNTER — Emergency Department (HOSPITAL_BASED_OUTPATIENT_CLINIC_OR_DEPARTMENT_OTHER): Payer: Medicare Other

## 2023-08-21 ENCOUNTER — Other Ambulatory Visit: Payer: Self-pay

## 2023-08-21 ENCOUNTER — Encounter (HOSPITAL_BASED_OUTPATIENT_CLINIC_OR_DEPARTMENT_OTHER): Payer: Self-pay | Admitting: Emergency Medicine

## 2023-08-21 ENCOUNTER — Emergency Department (HOSPITAL_BASED_OUTPATIENT_CLINIC_OR_DEPARTMENT_OTHER)
Admission: EM | Admit: 2023-08-21 | Discharge: 2023-08-21 | Disposition: A | Discharge status: Home / Self Care | Payer: Medicare Other | Attending: Emergency Medicine | Admitting: Emergency Medicine

## 2023-08-21 DIAGNOSIS — M4312 Spondylolisthesis, cervical region: Secondary | ICD-10-CM | POA: Diagnosis not present

## 2023-08-21 DIAGNOSIS — R609 Edema, unspecified: Secondary | ICD-10-CM | POA: Diagnosis not present

## 2023-08-21 DIAGNOSIS — M5030 Other cervical disc degeneration, unspecified cervical region: Secondary | ICD-10-CM | POA: Diagnosis not present

## 2023-08-21 DIAGNOSIS — R519 Headache, unspecified: Secondary | ICD-10-CM | POA: Diagnosis not present

## 2023-08-21 DIAGNOSIS — S72114A Nondisplaced fracture of greater trochanter of right femur, initial encounter for closed fracture: Secondary | ICD-10-CM | POA: Diagnosis not present

## 2023-08-21 DIAGNOSIS — R4182 Altered mental status, unspecified: Secondary | ICD-10-CM | POA: Diagnosis not present

## 2023-08-21 DIAGNOSIS — S0990XA Unspecified injury of head, initial encounter: Secondary | ICD-10-CM | POA: Diagnosis not present

## 2023-08-21 DIAGNOSIS — Z79899 Other long term (current) drug therapy: Secondary | ICD-10-CM | POA: Insufficient documentation

## 2023-08-21 DIAGNOSIS — S50311A Abrasion of right elbow, initial encounter: Secondary | ICD-10-CM | POA: Insufficient documentation

## 2023-08-21 DIAGNOSIS — M25551 Pain in right hip: Secondary | ICD-10-CM | POA: Diagnosis not present

## 2023-08-21 DIAGNOSIS — R9082 White matter disease, unspecified: Secondary | ICD-10-CM | POA: Diagnosis not present

## 2023-08-21 DIAGNOSIS — M25572 Pain in left ankle and joints of left foot: Secondary | ICD-10-CM | POA: Diagnosis not present

## 2023-08-21 DIAGNOSIS — N189 Chronic kidney disease, unspecified: Secondary | ICD-10-CM | POA: Diagnosis not present

## 2023-08-21 DIAGNOSIS — Y92129 Unspecified place in nursing home as the place of occurrence of the external cause: Secondary | ICD-10-CM | POA: Insufficient documentation

## 2023-08-21 DIAGNOSIS — I6529 Occlusion and stenosis of unspecified carotid artery: Secondary | ICD-10-CM | POA: Diagnosis not present

## 2023-08-21 DIAGNOSIS — Z7401 Bed confinement status: Secondary | ICD-10-CM | POA: Diagnosis not present

## 2023-08-21 DIAGNOSIS — S72111A Displaced fracture of greater trochanter of right femur, initial encounter for closed fracture: Secondary | ICD-10-CM | POA: Diagnosis not present

## 2023-08-21 DIAGNOSIS — M25521 Pain in right elbow: Secondary | ICD-10-CM | POA: Diagnosis not present

## 2023-08-21 DIAGNOSIS — W19XXXA Unspecified fall, initial encounter: Secondary | ICD-10-CM | POA: Diagnosis not present

## 2023-08-21 DIAGNOSIS — M8588 Other specified disorders of bone density and structure, other site: Secondary | ICD-10-CM | POA: Diagnosis not present

## 2023-08-21 DIAGNOSIS — Z96642 Presence of left artificial hip joint: Secondary | ICD-10-CM | POA: Diagnosis not present

## 2023-08-21 DIAGNOSIS — M8580 Other specified disorders of bone density and structure, unspecified site: Secondary | ICD-10-CM | POA: Diagnosis not present

## 2023-08-21 MED ORDER — ACETAMINOPHEN 500 MG PO TABS: 1000.0000 mg | ORAL_TABLET | Freq: Once | ORAL | Status: DC

## 2023-08-21 MED ORDER — LIDOCAINE 5 % EX PTCH: 1.0000 | MEDICATED_PATCH | CUTANEOUS | Status: DC

## 2023-08-21 NOTE — Discharge Instructions (Signed)
 Patient has small avulsion fracture to her right greater trochanter and right hip.  This is a weightbearing as tolerated injury.  She does not need surgery or any major intervention.  She just needs good pain control.  She should make sure she is in the wheelchair at all times and assisted when needing to use the bathroom or transfer.  Recommend continued use of Voltaren  gel, lidocaine  patches, Tylenol  as looks the ready be prescribed.  Recommend ice 20 minutes on every several hours.

## 2023-08-21 NOTE — ED Notes (Signed)
 Called Kimberly Frye

## 2023-08-21 NOTE — ED Notes (Signed)
 Contacted [facility], spoke with Dr. Julio Ohm; forwarded the call to Lowery Rue, DO

## 2023-08-21 NOTE — ED Triage Notes (Signed)
 Pt has baseline dementia per report. Brought by [facility] . Reported that patient started complaining of severe right hip pain when staff moved her and staff noted bruise to right hip. MD at bedside. Bruise is yellow/purple. Pt does endorse pain to right hip with exam.

## 2023-08-21 NOTE — ED Provider Notes (Signed)
 [place] [facility] AT [facility] Provider Note   CSN: [id number] Arrival date & time: 08/21/23  9266     History  Chief Complaint  Patient presents with   Hip Pain    Kimberly Frye is a 89 y.o. female.  Patient here with a fall couple days ago right hip pain.  History of memory issues.  At a skilled nursing facility.  Bruising to the right hip.  Ongoing pain and sent for evaluation.  Had not been evaluated yet.  Previous left hip injury and repair.  Patient states only her when she moves.  Talk to family on the phone who is aware of her fall couple days ago.  Aware of pain to the right hip but also may be some discomfort to the right elbow.  Is not on any blood thinners.  She does not think she hit her head.  She uses a wheelchair to ambulate she is not supposed to be walking.  But unfortunately she tries to walk at times per family.  The history is provided by the patient.       Home Medications Prior to Admission medications   Medication Sig Start Date End Date Taking? Authorizing Provider  amLODipine  (NORVASC ) 2.5 MG tablet Take 1 tablet (2.5 mg total) by mouth daily. Patient taking differently: Take 5 mg by mouth daily. 12/26/22   Patel, Poonamkumari J, MD  calcium  carbonate (OS-CAL - DOSED IN MG OF ELEMENTAL CALCIUM ) 1250 (500 Ca) MG tablet Take 1 tablet (1,250 mg total) by mouth 2 (two) times daily with a meal. 04/14/23   Jillian Buttery, MD  cetirizine (ZYRTEC ALLERGY) 10 MG tablet Take 10 mg by mouth daily as needed for allergies. 01/27/19   [provider]  cholecalciferol  (CHOLECALCIFEROL ) 25 MCG tablet Take 1 tablet (1,000 Units total) by mouth daily. 04/15/23   Jillian Buttery, MD  diclofenac  Sodium (VOLTAREN ) 1 % GEL Apply 2 g topically every 6 (six) hours as needed.    [provider]  lidocaine  4 % Place 1 patch onto the skin daily. 04/14/23   Jillian Buttery, MD  oxyCODONE  (OXY IR/ROXICODONE ) 5 MG immediate release tablet Take  0.5 tablets (2.5 mg total) by mouth every 6 (six) hours as needed for severe pain. 04/14/23   Adhikari, Amrit, MD  polyethylene glycol (MIRALAX ) 17 g packet Take 17 g by mouth daily. 04/14/23   Jillian Buttery, MD  senna (SENOKOT) 8.6 MG TABS tablet Take 1 tablet (8.6 mg total) by mouth in the morning and at bedtime. 04/14/23   Jillian Buttery, MD  sertraline  (ZOLOFT ) 100 MG tablet Take 100 mg by mouth daily.    [provider]  SYNTHROID  75 MCG tablet Take 75 mcg by mouth daily. 09/02/20   [provider]      Allergies    Bystolic [nebivolol hcl], Celexa [citalopram hydrobromide], and Penicillins    Review of Systems   Review of Systems  Physical Exam Updated Vital Signs BP (!) 145/91   Pulse 81   Temp 98 F (36.7 C) (Oral)   Resp 16   SpO2 95%  Physical Exam Vitals and nursing note reviewed.  Constitutional:      General: She is not in acute distress.    Appearance: She is well-developed. She is not ill-appearing.  HENT:     Head: Normocephalic and atraumatic.     Nose: Nose normal.     Mouth/Throat:     Mouth: Mucous membranes are moist.  Eyes:  Extraocular Movements: Extraocular movements intact.     Conjunctiva/sclera: Conjunctivae normal.     Pupils: Pupils are equal, round, and reactive to light.  Cardiovascular:     Rate and Rhythm: Normal rate and regular rhythm.     Heart sounds: No murmur heard. Pulmonary:     Effort: Pulmonary effort is normal. No respiratory distress.     Breath sounds: Normal breath sounds.  Abdominal:     Palpations: Abdomen is soft.     Tenderness: There is no abdominal tenderness.  Musculoskeletal:        General: No swelling.     Cervical back: Neck supple.     Comments: Tenderness to the right hip, no midline spinal tenderness  Skin:    General: Skin is warm and dry.     Capillary Refill: Capillary refill takes less than 2 seconds.     Findings: Bruising present.     Comments: Bruising to the right hip   Neurological:     General: No focal deficit present.     Mental Status: She is alert.     Cranial Nerves: No cranial nerve deficit.     Sensory: No sensory deficit.     Motor: No weakness.     Coordination: Coordination normal.     Comments: Patient can tell me her name well-appearing otherwise  Psychiatric:        Mood and Affect: Mood normal.     ED Results / Procedures / Treatments   Labs (all labs ordered are listed, but only abnormal results are displayed) Labs Reviewed - No data to display  EKG None  Radiology DG Elbow Complete Right Result Date: 08/21/2023 CLINICAL DATA:  89 year old female status post Un-witnessed fall. Bruising, pain. EXAM: RIGHT ELBOW - COMPLETE 3+ VIEW COMPARISON:  None Available. FINDINGS: Clothing and blood pressure cuff artifact. Bone mineralization here appears more normal for age. Maintained joint spaces and alignment. Lateral view is oblique, no joint effusion or acute fracture identified. Radial head appears intact. IMPRESSION: No acute fracture or dislocation identified about the right elbow. Electronically Signed   By: VEAR Hurst M.D.   On: 08/21/2023 08:31   DG Hip Unilat With Pelvis 2-3 Views Right Result Date: 08/21/2023 CLINICAL DATA:  89 year old female status post Un-witnessed fall. Bruising, pain. EXAM: DG HIP (WITH OR WITHOUT PELVIS) 2-3V RIGHT COMPARISON:  CT Abdomen and Pelvis 10/23/2020. pelvis and left hip series 05/19/2023. FINDINGS: Left total hip arthroplasty redemonstrated, appears stable since November. Underlying osteopenia. Chronic diastasis of the pubic symphysis is stable. SI joints appear stable. No acute pelvis fracture. Minimally displaced fracture of the right femur greater trochanter tip. This is new since November. Nondisplaced. Otherwise the proximal right femur appears intact. Chronic anterior right hip joint space loss with subchondral cysts there by CT in 2022. Chronic pelvic staple line. Calcified atherosclerosis. IMPRESSION:  1. Acute nondisplaced avulsion fracture at the tip of the right femur greater trochanter. 2. Otherwise stable pelvic osteopenia and chronic findings. Left total hip arthroplasty. Electronically Signed   By: VEAR Hurst M.D.   On: 08/21/2023 08:30   CT Cervical Spine Wo Contrast Result Date: 08/21/2023 CLINICAL DATA:  89 year old female status post Un-witnessed fall. Bruising, pain. EXAM: CT CERVICAL SPINE WITHOUT CONTRAST TECHNIQUE: Multidetector CT imaging of the cervical spine was performed without intravenous contrast. Multiplanar CT image reconstructions were also generated. RADIATION DOSE REDUCTION: This exam was performed according to the departmental dose-optimization program which includes automated exposure control, adjustment of the mA and/or  kV according to patient size and/or use of iterative reconstruction technique. COMPARISON:  Head CT today.  Cervical spine CT 04/09/2023. FINDINGS: Alignment: Stable. Chronic degenerative spondylolisthesis at C4-C5 and C7-T1. Maintained upper cervical lordosis. Stable posterior element alignment. Skull base and vertebrae: Osteopenia. Visualized skull base is intact. No atlanto-occipital dissociation. C1 and C2 appear chronically degenerated, intact and aligned. No acute osseous abnormality identified. Soft tissues and spinal canal: No prevertebral fluid or swelling. No visible canal hematoma. Calcified cervical carotid atherosclerosis. Disc levels: Advanced cervical spine degeneration superimposed on spondylolisthesis and ankylosis of C4-C5, stable since September. Upper chest: Visible upper thoracic levels appear intact. Lung apex emphysema and mild scarring redemonstrated. IMPRESSION: 1. No acute traumatic injury identified in the cervical spine. 2. Chronic osteopenia and advanced cervical spine degeneration. 3.  Emphysema (ICD10-J43.9). Electronically Signed   By: VEAR Hurst M.D.   On: 08/21/2023 08:26   CT Head Wo Contrast Result Date: 08/21/2023 CLINICAL DATA:   89 year old female status post Un-witnessed fall. Bruising, pain. EXAM: CT HEAD WITHOUT CONTRAST TECHNIQUE: Contiguous axial images were obtained from the base of the skull through the vertex without intravenous contrast. RADIATION DOSE REDUCTION: This exam was performed according to the departmental dose-optimization program which includes automated exposure control, adjustment of the mA and/or kV according to patient size and/or use of iterative reconstruction technique. COMPARISON:  Head CT 04/09/2023.  Brain MRI 12/24/2022. FINDINGS: Brain: Stable cerebral volume. Chronic perisylvian cerebral volume loss. No midline shift, ventriculomegaly, mass effect, evidence of mass lesion, intracranial hemorrhage or evidence of cortically based acute infarction. Patchy bilateral cerebral white matter hypodensity is stable from last year. Vascular: Calcified atherosclerosis at the skull base. No suspicious intracranial vascular hyperdensity. Skull: Stable.  No acute osseous abnormality identified. Sinuses/Orbits: Visualized paranasal sinuses and mastoids are stable and well aerated. Other: No acute orbit or scalp soft tissue injury identified. IMPRESSION: 1. No acute intracranial abnormality or acute traumatic injury identified. 2. Stable non contrast CT appearance of cerebral volume loss and white matter disease. Electronically Signed   By: VEAR Hurst M.D.   On: 08/21/2023 08:23    Procedures Procedures    Medications Ordered in ED Medications  lidocaine  (LIDODERM ) 5 % 1 patch (has no administration in time range)  acetaminophen  (TYLENOL ) tablet 1,000 mg (has no administration in time range)    ED Course/ Medical Decision Making/ A&P                                 Medical Decision Making Amount and/or Complexity of Data Reviewed Radiology: ordered.  Risk OTC drugs. Prescription drug management.   WINTA BARCELO is here after fall a few days ago with right hip pain.  History of depression anxiety  CKD.  Unremarkable vitals.  No fever.  Bruising to the right hip with abrasion to the right elbow.  Will get an x-ray of the right hip right elbow CT scan of the head and neck.  She had a fall while at nursing home.  She tried to ambulate without using a walker or help when she supposed be using mostly wheelchair at this time.  Previous left hip surgery and repair.  Overall she is neurovascular mostly intact.  Well-appearing.  Differential diagnosis likely contusion we will get x-rays to rule out fracture or other processes.  Will give lidocaine  patch and Tylenol  and reevaluate.  I talked with family on the phone.  X-ray of the hip.  He shows acute nondisplaced greater trochanter.  Otherwise x-ray is unremarkable.  X-ray of the right elbow with no fracture or malalignment.  CT of the head and neck per radiology report with no acute findings.  Overall I talked with Dr. Harden with orthopedics and states weightbearing as tolerated but should be wheelchair-bound as previously instructed with her prior injuries.  There is no need for any further workup at this time.  Recommend pain control with Tylenol  lidocaine  patches ice and rest.  Family made aware of these findings and instructions.  Patient discharged back to facility.  This chart was dictated using voice recognition software.  Despite best efforts to proofread,  errors can occur which can change the documentation meaning.         Final Clinical Impression(s) / ED Diagnoses Final diagnoses:  Closed nondisplaced fracture of greater trochanter of right femur, initial encounter [facility])    Rx / DC Orders ED Discharge Orders     None         Ruthe Cornet, DO 08/21/23 [phone]

## 2023-08-23 DIAGNOSIS — F0393 Unspecified dementia, unspecified severity, with mood disturbance: Secondary | ICD-10-CM | POA: Diagnosis not present

## 2023-08-23 DIAGNOSIS — M810 Age-related osteoporosis without current pathological fracture: Secondary | ICD-10-CM | POA: Diagnosis not present

## 2023-08-23 DIAGNOSIS — R1310 Dysphagia, unspecified: Secondary | ICD-10-CM | POA: Diagnosis not present

## 2023-08-23 DIAGNOSIS — M6281 Muscle weakness (generalized): Secondary | ICD-10-CM | POA: Diagnosis not present

## 2023-08-23 DIAGNOSIS — R296 Repeated falls: Secondary | ICD-10-CM | POA: Diagnosis not present

## 2023-08-23 DIAGNOSIS — F32A Depression, unspecified: Secondary | ICD-10-CM | POA: Diagnosis not present

## 2023-08-23 DIAGNOSIS — G309 Alzheimer's disease, unspecified: Secondary | ICD-10-CM | POA: Diagnosis not present

## 2023-08-23 DIAGNOSIS — S72114D Nondisplaced fracture of greater trochanter of right femur, subsequent encounter for closed fracture with routine healing: Secondary | ICD-10-CM | POA: Diagnosis not present

## 2023-08-23 DIAGNOSIS — M1991 Primary osteoarthritis, unspecified site: Secondary | ICD-10-CM | POA: Diagnosis not present

## 2023-08-24 DIAGNOSIS — R296 Repeated falls: Secondary | ICD-10-CM | POA: Diagnosis not present

## 2023-08-24 DIAGNOSIS — M810 Age-related osteoporosis without current pathological fracture: Secondary | ICD-10-CM | POA: Diagnosis not present

## 2023-08-24 DIAGNOSIS — G309 Alzheimer's disease, unspecified: Secondary | ICD-10-CM | POA: Diagnosis not present

## 2023-08-24 DIAGNOSIS — M6281 Muscle weakness (generalized): Secondary | ICD-10-CM | POA: Diagnosis not present

## 2023-08-24 DIAGNOSIS — M1991 Primary osteoarthritis, unspecified site: Secondary | ICD-10-CM | POA: Diagnosis not present

## 2023-08-24 DIAGNOSIS — R1310 Dysphagia, unspecified: Secondary | ICD-10-CM | POA: Diagnosis not present

## 2023-08-25 DIAGNOSIS — I129 Hypertensive chronic kidney disease with stage 1 through stage 4 chronic kidney disease, or unspecified chronic kidney disease: Secondary | ICD-10-CM | POA: Diagnosis not present

## 2023-08-25 DIAGNOSIS — N189 Chronic kidney disease, unspecified: Secondary | ICD-10-CM | POA: Diagnosis not present

## 2023-08-25 DIAGNOSIS — F03911 Unspecified dementia, unspecified severity, with agitation: Secondary | ICD-10-CM | POA: Diagnosis not present

## 2023-08-25 DIAGNOSIS — M810 Age-related osteoporosis without current pathological fracture: Secondary | ICD-10-CM | POA: Diagnosis not present

## 2023-08-25 DIAGNOSIS — R1310 Dysphagia, unspecified: Secondary | ICD-10-CM | POA: Diagnosis not present

## 2023-08-25 DIAGNOSIS — M1991 Primary osteoarthritis, unspecified site: Secondary | ICD-10-CM | POA: Diagnosis not present

## 2023-08-25 DIAGNOSIS — R296 Repeated falls: Secondary | ICD-10-CM | POA: Diagnosis not present

## 2023-08-25 DIAGNOSIS — M6281 Muscle weakness (generalized): Secondary | ICD-10-CM | POA: Diagnosis not present

## 2023-08-25 DIAGNOSIS — G309 Alzheimer's disease, unspecified: Secondary | ICD-10-CM | POA: Diagnosis not present

## 2023-08-25 DIAGNOSIS — D509 Iron deficiency anemia, unspecified: Secondary | ICD-10-CM | POA: Diagnosis not present

## 2023-08-26 DIAGNOSIS — R296 Repeated falls: Secondary | ICD-10-CM | POA: Diagnosis not present

## 2023-08-26 DIAGNOSIS — R1310 Dysphagia, unspecified: Secondary | ICD-10-CM | POA: Diagnosis not present

## 2023-08-26 DIAGNOSIS — M1991 Primary osteoarthritis, unspecified site: Secondary | ICD-10-CM | POA: Diagnosis not present

## 2023-08-26 DIAGNOSIS — M810 Age-related osteoporosis without current pathological fracture: Secondary | ICD-10-CM | POA: Diagnosis not present

## 2023-08-26 DIAGNOSIS — M6281 Muscle weakness (generalized): Secondary | ICD-10-CM | POA: Diagnosis not present

## 2023-08-26 DIAGNOSIS — I10 Essential (primary) hypertension: Secondary | ICD-10-CM | POA: Diagnosis not present

## 2023-08-26 DIAGNOSIS — G309 Alzheimer's disease, unspecified: Secondary | ICD-10-CM | POA: Diagnosis not present

## 2023-08-27 DIAGNOSIS — R1310 Dysphagia, unspecified: Secondary | ICD-10-CM | POA: Diagnosis not present

## 2023-08-27 DIAGNOSIS — M6281 Muscle weakness (generalized): Secondary | ICD-10-CM | POA: Diagnosis not present

## 2023-08-27 DIAGNOSIS — G309 Alzheimer's disease, unspecified: Secondary | ICD-10-CM | POA: Diagnosis not present

## 2023-08-27 DIAGNOSIS — M1991 Primary osteoarthritis, unspecified site: Secondary | ICD-10-CM | POA: Diagnosis not present

## 2023-08-27 DIAGNOSIS — M810 Age-related osteoporosis without current pathological fracture: Secondary | ICD-10-CM | POA: Diagnosis not present

## 2023-08-27 DIAGNOSIS — R296 Repeated falls: Secondary | ICD-10-CM | POA: Diagnosis not present

## 2023-08-30 DIAGNOSIS — R296 Repeated falls: Secondary | ICD-10-CM | POA: Diagnosis not present

## 2023-08-30 DIAGNOSIS — R1310 Dysphagia, unspecified: Secondary | ICD-10-CM | POA: Diagnosis not present

## 2023-08-30 DIAGNOSIS — M810 Age-related osteoporosis without current pathological fracture: Secondary | ICD-10-CM | POA: Diagnosis not present

## 2023-08-30 DIAGNOSIS — G309 Alzheimer's disease, unspecified: Secondary | ICD-10-CM | POA: Diagnosis not present

## 2023-08-30 DIAGNOSIS — M6281 Muscle weakness (generalized): Secondary | ICD-10-CM | POA: Diagnosis not present

## 2023-08-30 DIAGNOSIS — M1991 Primary osteoarthritis, unspecified site: Secondary | ICD-10-CM | POA: Diagnosis not present

## 2023-08-31 DIAGNOSIS — R296 Repeated falls: Secondary | ICD-10-CM | POA: Diagnosis not present

## 2023-08-31 DIAGNOSIS — R1310 Dysphagia, unspecified: Secondary | ICD-10-CM | POA: Diagnosis not present

## 2023-08-31 DIAGNOSIS — M810 Age-related osteoporosis without current pathological fracture: Secondary | ICD-10-CM | POA: Diagnosis not present

## 2023-08-31 DIAGNOSIS — M6281 Muscle weakness (generalized): Secondary | ICD-10-CM | POA: Diagnosis not present

## 2023-08-31 DIAGNOSIS — M1991 Primary osteoarthritis, unspecified site: Secondary | ICD-10-CM | POA: Diagnosis not present

## 2023-08-31 DIAGNOSIS — G309 Alzheimer's disease, unspecified: Secondary | ICD-10-CM | POA: Diagnosis not present

## 2023-09-01 DIAGNOSIS — M810 Age-related osteoporosis without current pathological fracture: Secondary | ICD-10-CM | POA: Diagnosis not present

## 2023-09-01 DIAGNOSIS — R296 Repeated falls: Secondary | ICD-10-CM | POA: Diagnosis not present

## 2023-09-01 DIAGNOSIS — M1991 Primary osteoarthritis, unspecified site: Secondary | ICD-10-CM | POA: Diagnosis not present

## 2023-09-01 DIAGNOSIS — R1310 Dysphagia, unspecified: Secondary | ICD-10-CM | POA: Diagnosis not present

## 2023-09-01 DIAGNOSIS — G309 Alzheimer's disease, unspecified: Secondary | ICD-10-CM | POA: Diagnosis not present

## 2023-09-01 DIAGNOSIS — M6281 Muscle weakness (generalized): Secondary | ICD-10-CM | POA: Diagnosis not present

## 2023-09-02 DIAGNOSIS — M6281 Muscle weakness (generalized): Secondary | ICD-10-CM | POA: Diagnosis not present

## 2023-09-02 DIAGNOSIS — R1310 Dysphagia, unspecified: Secondary | ICD-10-CM | POA: Diagnosis not present

## 2023-09-02 DIAGNOSIS — M810 Age-related osteoporosis without current pathological fracture: Secondary | ICD-10-CM | POA: Diagnosis not present

## 2023-09-02 DIAGNOSIS — F330 Major depressive disorder, recurrent, mild: Secondary | ICD-10-CM | POA: Diagnosis not present

## 2023-09-02 DIAGNOSIS — F419 Anxiety disorder, unspecified: Secondary | ICD-10-CM | POA: Diagnosis not present

## 2023-09-02 DIAGNOSIS — M1991 Primary osteoarthritis, unspecified site: Secondary | ICD-10-CM | POA: Diagnosis not present

## 2023-09-02 DIAGNOSIS — G309 Alzheimer's disease, unspecified: Secondary | ICD-10-CM | POA: Diagnosis not present

## 2023-09-02 DIAGNOSIS — R296 Repeated falls: Secondary | ICD-10-CM | POA: Diagnosis not present

## 2023-09-03 DIAGNOSIS — M6281 Muscle weakness (generalized): Secondary | ICD-10-CM | POA: Diagnosis not present

## 2023-09-03 DIAGNOSIS — M1991 Primary osteoarthritis, unspecified site: Secondary | ICD-10-CM | POA: Diagnosis not present

## 2023-09-03 DIAGNOSIS — R1310 Dysphagia, unspecified: Secondary | ICD-10-CM | POA: Diagnosis not present

## 2023-09-03 DIAGNOSIS — M810 Age-related osteoporosis without current pathological fracture: Secondary | ICD-10-CM | POA: Diagnosis not present

## 2023-09-03 DIAGNOSIS — G309 Alzheimer's disease, unspecified: Secondary | ICD-10-CM | POA: Diagnosis not present

## 2023-09-03 DIAGNOSIS — R296 Repeated falls: Secondary | ICD-10-CM | POA: Diagnosis not present

## 2023-09-06 DIAGNOSIS — M1991 Primary osteoarthritis, unspecified site: Secondary | ICD-10-CM | POA: Diagnosis not present

## 2023-09-06 DIAGNOSIS — M810 Age-related osteoporosis without current pathological fracture: Secondary | ICD-10-CM | POA: Diagnosis not present

## 2023-09-06 DIAGNOSIS — G309 Alzheimer's disease, unspecified: Secondary | ICD-10-CM | POA: Diagnosis not present

## 2023-09-06 DIAGNOSIS — R1310 Dysphagia, unspecified: Secondary | ICD-10-CM | POA: Diagnosis not present

## 2023-09-06 DIAGNOSIS — R296 Repeated falls: Secondary | ICD-10-CM | POA: Diagnosis not present

## 2023-09-06 DIAGNOSIS — M6281 Muscle weakness (generalized): Secondary | ICD-10-CM | POA: Diagnosis not present

## 2023-09-07 DIAGNOSIS — R296 Repeated falls: Secondary | ICD-10-CM | POA: Diagnosis not present

## 2023-09-07 DIAGNOSIS — F331 Major depressive disorder, recurrent, moderate: Secondary | ICD-10-CM | POA: Diagnosis not present

## 2023-09-07 DIAGNOSIS — G309 Alzheimer's disease, unspecified: Secondary | ICD-10-CM | POA: Diagnosis not present

## 2023-09-07 DIAGNOSIS — M810 Age-related osteoporosis without current pathological fracture: Secondary | ICD-10-CM | POA: Diagnosis not present

## 2023-09-07 DIAGNOSIS — M6281 Muscle weakness (generalized): Secondary | ICD-10-CM | POA: Diagnosis not present

## 2023-09-07 DIAGNOSIS — F01B0 Vascular dementia, moderate, without behavioral disturbance, psychotic disturbance, mood disturbance, and anxiety: Secondary | ICD-10-CM | POA: Diagnosis not present

## 2023-09-07 DIAGNOSIS — R1310 Dysphagia, unspecified: Secondary | ICD-10-CM | POA: Diagnosis not present

## 2023-09-07 DIAGNOSIS — F5105 Insomnia due to other mental disorder: Secondary | ICD-10-CM | POA: Diagnosis not present

## 2023-09-07 DIAGNOSIS — M1991 Primary osteoarthritis, unspecified site: Secondary | ICD-10-CM | POA: Diagnosis not present

## 2023-09-07 DIAGNOSIS — F411 Generalized anxiety disorder: Secondary | ICD-10-CM | POA: Diagnosis not present

## 2023-09-08 DIAGNOSIS — M79675 Pain in left toe(s): Secondary | ICD-10-CM | POA: Diagnosis not present

## 2023-09-08 DIAGNOSIS — R296 Repeated falls: Secondary | ICD-10-CM | POA: Diagnosis not present

## 2023-09-08 DIAGNOSIS — B351 Tinea unguium: Secondary | ICD-10-CM | POA: Diagnosis not present

## 2023-09-08 DIAGNOSIS — M1991 Primary osteoarthritis, unspecified site: Secondary | ICD-10-CM | POA: Diagnosis not present

## 2023-09-08 DIAGNOSIS — R1310 Dysphagia, unspecified: Secondary | ICD-10-CM | POA: Diagnosis not present

## 2023-09-08 DIAGNOSIS — G309 Alzheimer's disease, unspecified: Secondary | ICD-10-CM | POA: Diagnosis not present

## 2023-09-08 DIAGNOSIS — M810 Age-related osteoporosis without current pathological fracture: Secondary | ICD-10-CM | POA: Diagnosis not present

## 2023-09-08 DIAGNOSIS — M79674 Pain in right toe(s): Secondary | ICD-10-CM | POA: Diagnosis not present

## 2023-09-08 DIAGNOSIS — M6281 Muscle weakness (generalized): Secondary | ICD-10-CM | POA: Diagnosis not present

## 2023-09-09 DIAGNOSIS — M810 Age-related osteoporosis without current pathological fracture: Secondary | ICD-10-CM | POA: Diagnosis not present

## 2023-09-09 DIAGNOSIS — R296 Repeated falls: Secondary | ICD-10-CM | POA: Diagnosis not present

## 2023-09-09 DIAGNOSIS — F331 Major depressive disorder, recurrent, moderate: Secondary | ICD-10-CM | POA: Diagnosis not present

## 2023-09-09 DIAGNOSIS — M1991 Primary osteoarthritis, unspecified site: Secondary | ICD-10-CM | POA: Diagnosis not present

## 2023-09-09 DIAGNOSIS — G309 Alzheimer's disease, unspecified: Secondary | ICD-10-CM | POA: Diagnosis not present

## 2023-09-09 DIAGNOSIS — F419 Anxiety disorder, unspecified: Secondary | ICD-10-CM | POA: Diagnosis not present

## 2023-09-09 DIAGNOSIS — R1310 Dysphagia, unspecified: Secondary | ICD-10-CM | POA: Diagnosis not present

## 2023-09-09 DIAGNOSIS — M6281 Muscle weakness (generalized): Secondary | ICD-10-CM | POA: Diagnosis not present

## 2023-09-10 DIAGNOSIS — G309 Alzheimer's disease, unspecified: Secondary | ICD-10-CM | POA: Diagnosis not present

## 2023-09-10 DIAGNOSIS — M810 Age-related osteoporosis without current pathological fracture: Secondary | ICD-10-CM | POA: Diagnosis not present

## 2023-09-10 DIAGNOSIS — M6281 Muscle weakness (generalized): Secondary | ICD-10-CM | POA: Diagnosis not present

## 2023-09-10 DIAGNOSIS — R1310 Dysphagia, unspecified: Secondary | ICD-10-CM | POA: Diagnosis not present

## 2023-09-10 DIAGNOSIS — M1991 Primary osteoarthritis, unspecified site: Secondary | ICD-10-CM | POA: Diagnosis not present

## 2023-09-10 DIAGNOSIS — R296 Repeated falls: Secondary | ICD-10-CM | POA: Diagnosis not present

## 2023-09-15 ENCOUNTER — Ambulatory Visit: Payer: Medicare Other | Admitting: Physician Assistant

## 2023-09-20 ENCOUNTER — Ambulatory Visit: Payer: Medicare Other | Admitting: Physician Assistant

## 2023-09-21 DIAGNOSIS — F339 Major depressive disorder, recurrent, unspecified: Secondary | ICD-10-CM | POA: Diagnosis not present

## 2023-09-21 DIAGNOSIS — F411 Generalized anxiety disorder: Secondary | ICD-10-CM | POA: Diagnosis not present

## 2023-09-21 DIAGNOSIS — G8929 Other chronic pain: Secondary | ICD-10-CM | POA: Diagnosis not present

## 2023-09-21 DIAGNOSIS — G309 Alzheimer's disease, unspecified: Secondary | ICD-10-CM | POA: Diagnosis not present

## 2023-09-21 DIAGNOSIS — F01B0 Vascular dementia, moderate, without behavioral disturbance, psychotic disturbance, mood disturbance, and anxiety: Secondary | ICD-10-CM | POA: Diagnosis not present

## 2023-09-21 DIAGNOSIS — F419 Anxiety disorder, unspecified: Secondary | ICD-10-CM | POA: Diagnosis not present

## 2023-09-21 DIAGNOSIS — F331 Major depressive disorder, recurrent, moderate: Secondary | ICD-10-CM | POA: Diagnosis not present

## 2023-09-21 DIAGNOSIS — F5105 Insomnia due to other mental disorder: Secondary | ICD-10-CM | POA: Diagnosis not present

## 2023-09-30 DIAGNOSIS — F331 Major depressive disorder, recurrent, moderate: Secondary | ICD-10-CM | POA: Diagnosis not present

## 2023-09-30 DIAGNOSIS — F419 Anxiety disorder, unspecified: Secondary | ICD-10-CM | POA: Diagnosis not present

## 2023-10-02 DIAGNOSIS — R410 Disorientation, unspecified: Secondary | ICD-10-CM | POA: Diagnosis not present

## 2023-10-05 DIAGNOSIS — G8929 Other chronic pain: Secondary | ICD-10-CM | POA: Diagnosis not present

## 2023-10-05 DIAGNOSIS — F5105 Insomnia due to other mental disorder: Secondary | ICD-10-CM | POA: Diagnosis not present

## 2023-10-05 DIAGNOSIS — F411 Generalized anxiety disorder: Secondary | ICD-10-CM | POA: Diagnosis not present

## 2023-10-05 DIAGNOSIS — F331 Major depressive disorder, recurrent, moderate: Secondary | ICD-10-CM | POA: Diagnosis not present

## 2023-10-05 DIAGNOSIS — I10 Essential (primary) hypertension: Secondary | ICD-10-CM | POA: Diagnosis not present

## 2023-10-05 DIAGNOSIS — F01518 Vascular dementia, unspecified severity, with other behavioral disturbance: Secondary | ICD-10-CM | POA: Diagnosis not present

## 2023-10-05 DIAGNOSIS — Z79899 Other long term (current) drug therapy: Secondary | ICD-10-CM | POA: Diagnosis not present

## 2023-10-12 DIAGNOSIS — R296 Repeated falls: Secondary | ICD-10-CM | POA: Diagnosis not present

## 2023-10-14 DIAGNOSIS — F331 Major depressive disorder, recurrent, moderate: Secondary | ICD-10-CM | POA: Diagnosis not present

## 2023-10-14 DIAGNOSIS — F419 Anxiety disorder, unspecified: Secondary | ICD-10-CM | POA: Diagnosis not present

## 2023-10-19 DIAGNOSIS — F01518 Vascular dementia, unspecified severity, with other behavioral disturbance: Secondary | ICD-10-CM | POA: Diagnosis not present

## 2023-10-19 DIAGNOSIS — F411 Generalized anxiety disorder: Secondary | ICD-10-CM | POA: Diagnosis not present

## 2023-10-19 DIAGNOSIS — F5105 Insomnia due to other mental disorder: Secondary | ICD-10-CM | POA: Diagnosis not present

## 2023-10-19 DIAGNOSIS — F331 Major depressive disorder, recurrent, moderate: Secondary | ICD-10-CM | POA: Diagnosis not present

## 2023-10-28 DIAGNOSIS — F330 Major depressive disorder, recurrent, mild: Secondary | ICD-10-CM | POA: Diagnosis not present

## 2023-10-28 DIAGNOSIS — F411 Generalized anxiety disorder: Secondary | ICD-10-CM | POA: Diagnosis not present

## 2023-11-02 DIAGNOSIS — F5105 Insomnia due to other mental disorder: Secondary | ICD-10-CM | POA: Diagnosis not present

## 2023-11-02 DIAGNOSIS — F411 Generalized anxiety disorder: Secondary | ICD-10-CM | POA: Diagnosis not present

## 2023-11-02 DIAGNOSIS — F331 Major depressive disorder, recurrent, moderate: Secondary | ICD-10-CM | POA: Diagnosis not present

## 2023-11-02 DIAGNOSIS — G8929 Other chronic pain: Secondary | ICD-10-CM | POA: Diagnosis not present

## 2023-11-02 DIAGNOSIS — F01518 Vascular dementia, unspecified severity, with other behavioral disturbance: Secondary | ICD-10-CM | POA: Diagnosis not present

## 2023-11-11 DIAGNOSIS — F411 Generalized anxiety disorder: Secondary | ICD-10-CM | POA: Diagnosis not present

## 2023-11-11 DIAGNOSIS — F330 Major depressive disorder, recurrent, mild: Secondary | ICD-10-CM | POA: Diagnosis not present

## 2023-11-15 DIAGNOSIS — K5900 Constipation, unspecified: Secondary | ICD-10-CM | POA: Diagnosis not present

## 2023-11-15 DIAGNOSIS — E039 Hypothyroidism, unspecified: Secondary | ICD-10-CM | POA: Diagnosis not present

## 2023-11-15 DIAGNOSIS — F0283 Dementia in other diseases classified elsewhere, unspecified severity, with mood disturbance: Secondary | ICD-10-CM | POA: Diagnosis not present

## 2023-11-15 DIAGNOSIS — I129 Hypertensive chronic kidney disease with stage 1 through stage 4 chronic kidney disease, or unspecified chronic kidney disease: Secondary | ICD-10-CM | POA: Diagnosis not present

## 2023-11-15 DIAGNOSIS — F32A Depression, unspecified: Secondary | ICD-10-CM | POA: Diagnosis not present

## 2023-11-15 DIAGNOSIS — N189 Chronic kidney disease, unspecified: Secondary | ICD-10-CM | POA: Diagnosis not present

## 2023-11-15 DIAGNOSIS — S72114D Nondisplaced fracture of greater trochanter of right femur, subsequent encounter for closed fracture with routine healing: Secondary | ICD-10-CM | POA: Diagnosis not present

## 2023-11-15 DIAGNOSIS — D509 Iron deficiency anemia, unspecified: Secondary | ICD-10-CM | POA: Diagnosis not present

## 2023-11-16 DIAGNOSIS — G8929 Other chronic pain: Secondary | ICD-10-CM | POA: Diagnosis not present

## 2023-11-16 DIAGNOSIS — F5105 Insomnia due to other mental disorder: Secondary | ICD-10-CM | POA: Diagnosis not present

## 2023-11-16 DIAGNOSIS — F331 Major depressive disorder, recurrent, moderate: Secondary | ICD-10-CM | POA: Diagnosis not present

## 2023-11-16 DIAGNOSIS — F411 Generalized anxiety disorder: Secondary | ICD-10-CM | POA: Diagnosis not present

## 2023-11-16 DIAGNOSIS — F01518 Vascular dementia, unspecified severity, with other behavioral disturbance: Secondary | ICD-10-CM | POA: Diagnosis not present

## 2023-11-19 DIAGNOSIS — F0393 Unspecified dementia, unspecified severity, with mood disturbance: Secondary | ICD-10-CM | POA: Diagnosis not present

## 2023-11-19 DIAGNOSIS — L03113 Cellulitis of right upper limb: Secondary | ICD-10-CM | POA: Diagnosis not present

## 2023-11-19 DIAGNOSIS — M79641 Pain in right hand: Secondary | ICD-10-CM | POA: Diagnosis not present

## 2023-11-25 DIAGNOSIS — F330 Major depressive disorder, recurrent, mild: Secondary | ICD-10-CM | POA: Diagnosis not present

## 2023-11-25 DIAGNOSIS — F419 Anxiety disorder, unspecified: Secondary | ICD-10-CM | POA: Diagnosis not present

## 2023-11-29 DIAGNOSIS — D509 Iron deficiency anemia, unspecified: Secondary | ICD-10-CM | POA: Diagnosis not present

## 2023-11-29 DIAGNOSIS — R197 Diarrhea, unspecified: Secondary | ICD-10-CM | POA: Diagnosis not present

## 2023-11-29 DIAGNOSIS — F0390 Unspecified dementia without behavioral disturbance: Secondary | ICD-10-CM | POA: Diagnosis not present

## 2023-11-30 DIAGNOSIS — R197 Diarrhea, unspecified: Secondary | ICD-10-CM | POA: Diagnosis not present

## 2023-11-30 DIAGNOSIS — F331 Major depressive disorder, recurrent, moderate: Secondary | ICD-10-CM | POA: Diagnosis not present

## 2023-11-30 DIAGNOSIS — N1831 Chronic kidney disease, stage 3a: Secondary | ICD-10-CM | POA: Diagnosis not present

## 2023-11-30 DIAGNOSIS — F411 Generalized anxiety disorder: Secondary | ICD-10-CM | POA: Diagnosis not present

## 2023-11-30 DIAGNOSIS — F5105 Insomnia due to other mental disorder: Secondary | ICD-10-CM | POA: Diagnosis not present

## 2023-11-30 DIAGNOSIS — G8929 Other chronic pain: Secondary | ICD-10-CM | POA: Diagnosis not present

## 2023-11-30 DIAGNOSIS — F01518 Vascular dementia, unspecified severity, with other behavioral disturbance: Secondary | ICD-10-CM | POA: Diagnosis not present

## 2023-11-30 DIAGNOSIS — I10 Essential (primary) hypertension: Secondary | ICD-10-CM | POA: Diagnosis not present

## 2023-12-14 DIAGNOSIS — F5105 Insomnia due to other mental disorder: Secondary | ICD-10-CM | POA: Diagnosis not present

## 2023-12-14 DIAGNOSIS — F331 Major depressive disorder, recurrent, moderate: Secondary | ICD-10-CM | POA: Diagnosis not present

## 2023-12-14 DIAGNOSIS — F411 Generalized anxiety disorder: Secondary | ICD-10-CM | POA: Diagnosis not present

## 2023-12-14 DIAGNOSIS — F01518 Vascular dementia, unspecified severity, with other behavioral disturbance: Secondary | ICD-10-CM | POA: Diagnosis not present

## 2023-12-14 DIAGNOSIS — G8929 Other chronic pain: Secondary | ICD-10-CM | POA: Diagnosis not present

## 2023-12-16 DIAGNOSIS — F419 Anxiety disorder, unspecified: Secondary | ICD-10-CM | POA: Diagnosis not present

## 2023-12-16 DIAGNOSIS — F330 Major depressive disorder, recurrent, mild: Secondary | ICD-10-CM | POA: Diagnosis not present

## 2023-12-28 DIAGNOSIS — F5105 Insomnia due to other mental disorder: Secondary | ICD-10-CM | POA: Diagnosis not present

## 2023-12-28 DIAGNOSIS — G8929 Other chronic pain: Secondary | ICD-10-CM | POA: Diagnosis not present

## 2023-12-28 DIAGNOSIS — F331 Major depressive disorder, recurrent, moderate: Secondary | ICD-10-CM | POA: Diagnosis not present

## 2023-12-28 DIAGNOSIS — F01518 Vascular dementia, unspecified severity, with other behavioral disturbance: Secondary | ICD-10-CM | POA: Diagnosis not present

## 2023-12-30 DIAGNOSIS — F419 Anxiety disorder, unspecified: Secondary | ICD-10-CM | POA: Diagnosis not present

## 2023-12-30 DIAGNOSIS — F331 Major depressive disorder, recurrent, moderate: Secondary | ICD-10-CM | POA: Diagnosis not present

## 2024-01-03 DIAGNOSIS — F03911 Unspecified dementia, unspecified severity, with agitation: Secondary | ICD-10-CM | POA: Diagnosis not present

## 2024-01-03 DIAGNOSIS — I129 Hypertensive chronic kidney disease with stage 1 through stage 4 chronic kidney disease, or unspecified chronic kidney disease: Secondary | ICD-10-CM | POA: Diagnosis not present

## 2024-01-03 DIAGNOSIS — N189 Chronic kidney disease, unspecified: Secondary | ICD-10-CM | POA: Diagnosis not present

## 2024-01-03 DIAGNOSIS — D509 Iron deficiency anemia, unspecified: Secondary | ICD-10-CM | POA: Diagnosis not present

## 2024-01-10 DIAGNOSIS — G8929 Other chronic pain: Secondary | ICD-10-CM | POA: Diagnosis not present

## 2024-01-10 DIAGNOSIS — F411 Generalized anxiety disorder: Secondary | ICD-10-CM | POA: Diagnosis not present

## 2024-01-14 DIAGNOSIS — F0280 Dementia in other diseases classified elsewhere without behavioral disturbance: Secondary | ICD-10-CM | POA: Diagnosis not present

## 2024-01-14 DIAGNOSIS — W19XXXA Unspecified fall, initial encounter: Secondary | ICD-10-CM | POA: Diagnosis not present

## 2024-01-14 DIAGNOSIS — S01112A Laceration without foreign body of left eyelid and periocular area, initial encounter: Secondary | ICD-10-CM | POA: Diagnosis not present

## 2024-01-14 DIAGNOSIS — R9082 White matter disease, unspecified: Secondary | ICD-10-CM | POA: Diagnosis not present

## 2024-01-14 DIAGNOSIS — M79603 Pain in arm, unspecified: Secondary | ICD-10-CM | POA: Diagnosis not present

## 2024-01-14 DIAGNOSIS — G309 Alzheimer's disease, unspecified: Secondary | ICD-10-CM | POA: Diagnosis not present

## 2024-01-14 DIAGNOSIS — Z880 Allergy status to penicillin: Secondary | ICD-10-CM | POA: Diagnosis not present

## 2024-01-14 DIAGNOSIS — R531 Weakness: Secondary | ICD-10-CM | POA: Diagnosis not present

## 2024-01-14 DIAGNOSIS — J439 Emphysema, unspecified: Secondary | ICD-10-CM | POA: Diagnosis not present

## 2024-01-14 DIAGNOSIS — Z7401 Bed confinement status: Secondary | ICD-10-CM | POA: Diagnosis not present

## 2024-01-14 DIAGNOSIS — E039 Hypothyroidism, unspecified: Secondary | ICD-10-CM | POA: Diagnosis not present

## 2024-01-14 DIAGNOSIS — S0990XA Unspecified injury of head, initial encounter: Secondary | ICD-10-CM | POA: Diagnosis not present

## 2024-01-14 DIAGNOSIS — I10 Essential (primary) hypertension: Secondary | ICD-10-CM | POA: Diagnosis not present

## 2024-01-14 DIAGNOSIS — G4489 Other headache syndrome: Secondary | ICD-10-CM | POA: Diagnosis not present

## 2024-01-14 DIAGNOSIS — S0181XA Laceration without foreign body of other part of head, initial encounter: Secondary | ICD-10-CM | POA: Diagnosis not present

## 2024-01-20 DIAGNOSIS — F411 Generalized anxiety disorder: Secondary | ICD-10-CM | POA: Diagnosis not present

## 2024-01-20 DIAGNOSIS — F330 Major depressive disorder, recurrent, mild: Secondary | ICD-10-CM | POA: Diagnosis not present

## 2024-01-25 DIAGNOSIS — F5105 Insomnia due to other mental disorder: Secondary | ICD-10-CM | POA: Diagnosis not present

## 2024-01-25 DIAGNOSIS — Z79899 Other long term (current) drug therapy: Secondary | ICD-10-CM | POA: Diagnosis not present

## 2024-01-25 DIAGNOSIS — F331 Major depressive disorder, recurrent, moderate: Secondary | ICD-10-CM | POA: Diagnosis not present

## 2024-01-25 DIAGNOSIS — F0150 Vascular dementia without behavioral disturbance: Secondary | ICD-10-CM | POA: Diagnosis not present

## 2024-01-25 DIAGNOSIS — G8929 Other chronic pain: Secondary | ICD-10-CM | POA: Diagnosis not present

## 2024-01-25 DIAGNOSIS — F411 Generalized anxiety disorder: Secondary | ICD-10-CM | POA: Diagnosis not present

## 2024-02-03 DIAGNOSIS — F419 Anxiety disorder, unspecified: Secondary | ICD-10-CM | POA: Diagnosis not present

## 2024-02-03 DIAGNOSIS — F330 Major depressive disorder, recurrent, mild: Secondary | ICD-10-CM | POA: Diagnosis not present

## 2024-02-08 DIAGNOSIS — Z79899 Other long term (current) drug therapy: Secondary | ICD-10-CM | POA: Diagnosis not present

## 2024-02-08 DIAGNOSIS — F0150 Vascular dementia without behavioral disturbance: Secondary | ICD-10-CM | POA: Diagnosis not present

## 2024-02-08 DIAGNOSIS — F331 Major depressive disorder, recurrent, moderate: Secondary | ICD-10-CM | POA: Diagnosis not present

## 2024-02-08 DIAGNOSIS — G8929 Other chronic pain: Secondary | ICD-10-CM | POA: Diagnosis not present

## 2024-02-08 DIAGNOSIS — F5105 Insomnia due to other mental disorder: Secondary | ICD-10-CM | POA: Diagnosis not present

## 2024-02-22 DIAGNOSIS — F0150 Vascular dementia without behavioral disturbance: Secondary | ICD-10-CM | POA: Diagnosis not present

## 2024-02-22 DIAGNOSIS — F331 Major depressive disorder, recurrent, moderate: Secondary | ICD-10-CM | POA: Diagnosis not present

## 2024-02-22 DIAGNOSIS — F5105 Insomnia due to other mental disorder: Secondary | ICD-10-CM | POA: Diagnosis not present

## 2024-02-22 DIAGNOSIS — G8929 Other chronic pain: Secondary | ICD-10-CM | POA: Diagnosis not present

## 2024-03-06 DIAGNOSIS — F411 Generalized anxiety disorder: Secondary | ICD-10-CM | POA: Diagnosis not present

## 2024-03-06 DIAGNOSIS — G8929 Other chronic pain: Secondary | ICD-10-CM | POA: Diagnosis not present

## 2024-03-21 DIAGNOSIS — G309 Alzheimer's disease, unspecified: Secondary | ICD-10-CM | POA: Diagnosis not present

## 2024-03-24 DIAGNOSIS — F02811 Dementia in other diseases classified elsewhere, unspecified severity, with agitation: Secondary | ICD-10-CM | POA: Diagnosis not present

## 2024-03-24 DIAGNOSIS — I129 Hypertensive chronic kidney disease with stage 1 through stage 4 chronic kidney disease, or unspecified chronic kidney disease: Secondary | ICD-10-CM | POA: Diagnosis not present

## 2024-03-24 DIAGNOSIS — D509 Iron deficiency anemia, unspecified: Secondary | ICD-10-CM | POA: Diagnosis not present

## 2024-03-24 DIAGNOSIS — F32A Depression, unspecified: Secondary | ICD-10-CM | POA: Diagnosis not present

## 2024-03-24 DIAGNOSIS — E039 Hypothyroidism, unspecified: Secondary | ICD-10-CM | POA: Diagnosis not present

## 2024-03-24 DIAGNOSIS — N189 Chronic kidney disease, unspecified: Secondary | ICD-10-CM | POA: Diagnosis not present

## 2024-03-24 DIAGNOSIS — F0283 Dementia in other diseases classified elsewhere, unspecified severity, with mood disturbance: Secondary | ICD-10-CM | POA: Diagnosis not present

## 2024-03-28 DIAGNOSIS — F5105 Insomnia due to other mental disorder: Secondary | ICD-10-CM | POA: Diagnosis not present

## 2024-03-28 DIAGNOSIS — F331 Major depressive disorder, recurrent, moderate: Secondary | ICD-10-CM | POA: Diagnosis not present

## 2024-03-28 DIAGNOSIS — G8929 Other chronic pain: Secondary | ICD-10-CM | POA: Diagnosis not present

## 2024-03-28 DIAGNOSIS — Z79899 Other long term (current) drug therapy: Secondary | ICD-10-CM | POA: Diagnosis not present

## 2024-03-28 DIAGNOSIS — F0150 Vascular dementia without behavioral disturbance: Secondary | ICD-10-CM | POA: Diagnosis not present

## 2024-04-18 DIAGNOSIS — E559 Vitamin D deficiency, unspecified: Secondary | ICD-10-CM | POA: Diagnosis not present

## 2024-04-18 DIAGNOSIS — D509 Iron deficiency anemia, unspecified: Secondary | ICD-10-CM | POA: Diagnosis not present

## 2024-04-25 DIAGNOSIS — F331 Major depressive disorder, recurrent, moderate: Secondary | ICD-10-CM | POA: Diagnosis not present

## 2024-04-25 DIAGNOSIS — Z79899 Other long term (current) drug therapy: Secondary | ICD-10-CM | POA: Diagnosis not present

## 2024-04-25 DIAGNOSIS — G8929 Other chronic pain: Secondary | ICD-10-CM | POA: Diagnosis not present

## 2024-04-25 DIAGNOSIS — F5105 Insomnia due to other mental disorder: Secondary | ICD-10-CM | POA: Diagnosis not present

## 2024-04-25 DIAGNOSIS — F0150 Vascular dementia without behavioral disturbance: Secondary | ICD-10-CM | POA: Diagnosis not present

## 2024-05-09 DIAGNOSIS — G8929 Other chronic pain: Secondary | ICD-10-CM | POA: Diagnosis not present

## 2024-05-09 DIAGNOSIS — F331 Major depressive disorder, recurrent, moderate: Secondary | ICD-10-CM | POA: Diagnosis not present

## 2024-05-09 DIAGNOSIS — F0150 Vascular dementia without behavioral disturbance: Secondary | ICD-10-CM | POA: Diagnosis not present

## 2024-05-09 DIAGNOSIS — F5105 Insomnia due to other mental disorder: Secondary | ICD-10-CM | POA: Diagnosis not present

## 2024-05-09 DIAGNOSIS — F411 Generalized anxiety disorder: Secondary | ICD-10-CM | POA: Diagnosis not present

## 2024-05-09 DIAGNOSIS — Z79899 Other long term (current) drug therapy: Secondary | ICD-10-CM | POA: Diagnosis not present

## 2024-05-15 ENCOUNTER — Encounter: Payer: Self-pay | Admitting: Radiology

## 2024-05-15 DIAGNOSIS — M6281 Muscle weakness (generalized): Secondary | ICD-10-CM | POA: Diagnosis not present

## 2024-05-15 DIAGNOSIS — Z9181 History of falling: Secondary | ICD-10-CM | POA: Diagnosis not present

## 2024-05-15 DIAGNOSIS — G309 Alzheimer's disease, unspecified: Secondary | ICD-10-CM | POA: Diagnosis not present

## 2024-05-15 DIAGNOSIS — R296 Repeated falls: Secondary | ICD-10-CM | POA: Diagnosis not present

## 2024-05-15 DIAGNOSIS — Z471 Aftercare following joint replacement surgery: Secondary | ICD-10-CM | POA: Diagnosis not present

## 2024-05-15 DIAGNOSIS — R2689 Other abnormalities of gait and mobility: Secondary | ICD-10-CM | POA: Diagnosis not present

## 2024-05-16 DIAGNOSIS — G309 Alzheimer's disease, unspecified: Secondary | ICD-10-CM | POA: Diagnosis not present

## 2024-05-16 DIAGNOSIS — M6281 Muscle weakness (generalized): Secondary | ICD-10-CM | POA: Diagnosis not present

## 2024-05-16 DIAGNOSIS — R296 Repeated falls: Secondary | ICD-10-CM | POA: Diagnosis not present

## 2024-05-16 DIAGNOSIS — R2689 Other abnormalities of gait and mobility: Secondary | ICD-10-CM | POA: Diagnosis not present

## 2024-05-16 DIAGNOSIS — Z9181 History of falling: Secondary | ICD-10-CM | POA: Diagnosis not present

## 2024-05-16 DIAGNOSIS — Z471 Aftercare following joint replacement surgery: Secondary | ICD-10-CM | POA: Diagnosis not present

## 2024-05-17 DIAGNOSIS — G309 Alzheimer's disease, unspecified: Secondary | ICD-10-CM | POA: Diagnosis not present

## 2024-05-17 DIAGNOSIS — Z471 Aftercare following joint replacement surgery: Secondary | ICD-10-CM | POA: Diagnosis not present

## 2024-05-17 DIAGNOSIS — R296 Repeated falls: Secondary | ICD-10-CM | POA: Diagnosis not present

## 2024-05-17 DIAGNOSIS — Z9181 History of falling: Secondary | ICD-10-CM | POA: Diagnosis not present

## 2024-05-17 DIAGNOSIS — R2689 Other abnormalities of gait and mobility: Secondary | ICD-10-CM | POA: Diagnosis not present

## 2024-05-17 DIAGNOSIS — M6281 Muscle weakness (generalized): Secondary | ICD-10-CM | POA: Diagnosis not present

## 2024-05-18 DIAGNOSIS — M6281 Muscle weakness (generalized): Secondary | ICD-10-CM | POA: Diagnosis not present

## 2024-05-18 DIAGNOSIS — R296 Repeated falls: Secondary | ICD-10-CM | POA: Diagnosis not present

## 2024-05-18 DIAGNOSIS — R2689 Other abnormalities of gait and mobility: Secondary | ICD-10-CM | POA: Diagnosis not present

## 2024-05-18 DIAGNOSIS — Z9181 History of falling: Secondary | ICD-10-CM | POA: Diagnosis not present

## 2024-05-18 DIAGNOSIS — G309 Alzheimer's disease, unspecified: Secondary | ICD-10-CM | POA: Diagnosis not present

## 2024-05-18 DIAGNOSIS — Z471 Aftercare following joint replacement surgery: Secondary | ICD-10-CM | POA: Diagnosis not present

## 2024-05-19 DIAGNOSIS — R2689 Other abnormalities of gait and mobility: Secondary | ICD-10-CM | POA: Diagnosis not present

## 2024-05-19 DIAGNOSIS — R296 Repeated falls: Secondary | ICD-10-CM | POA: Diagnosis not present

## 2024-05-19 DIAGNOSIS — G309 Alzheimer's disease, unspecified: Secondary | ICD-10-CM | POA: Diagnosis not present

## 2024-05-19 DIAGNOSIS — Z9181 History of falling: Secondary | ICD-10-CM | POA: Diagnosis not present

## 2024-05-19 DIAGNOSIS — M6281 Muscle weakness (generalized): Secondary | ICD-10-CM | POA: Diagnosis not present

## 2024-05-19 DIAGNOSIS — Z471 Aftercare following joint replacement surgery: Secondary | ICD-10-CM | POA: Diagnosis not present

## 2024-05-22 DIAGNOSIS — R2689 Other abnormalities of gait and mobility: Secondary | ICD-10-CM | POA: Diagnosis not present

## 2024-05-22 DIAGNOSIS — Z9181 History of falling: Secondary | ICD-10-CM | POA: Diagnosis not present

## 2024-05-22 DIAGNOSIS — G309 Alzheimer's disease, unspecified: Secondary | ICD-10-CM | POA: Diagnosis not present

## 2024-05-22 DIAGNOSIS — Z471 Aftercare following joint replacement surgery: Secondary | ICD-10-CM | POA: Diagnosis not present

## 2024-05-22 DIAGNOSIS — M6281 Muscle weakness (generalized): Secondary | ICD-10-CM | POA: Diagnosis not present

## 2024-05-22 DIAGNOSIS — R296 Repeated falls: Secondary | ICD-10-CM | POA: Diagnosis not present

## 2024-05-23 DIAGNOSIS — M6281 Muscle weakness (generalized): Secondary | ICD-10-CM | POA: Diagnosis not present

## 2024-05-23 DIAGNOSIS — Z471 Aftercare following joint replacement surgery: Secondary | ICD-10-CM | POA: Diagnosis not present

## 2024-05-23 DIAGNOSIS — F0150 Vascular dementia without behavioral disturbance: Secondary | ICD-10-CM | POA: Diagnosis not present

## 2024-05-23 DIAGNOSIS — G309 Alzheimer's disease, unspecified: Secondary | ICD-10-CM | POA: Diagnosis not present

## 2024-05-23 DIAGNOSIS — R2689 Other abnormalities of gait and mobility: Secondary | ICD-10-CM | POA: Diagnosis not present

## 2024-05-23 DIAGNOSIS — F331 Major depressive disorder, recurrent, moderate: Secondary | ICD-10-CM | POA: Diagnosis not present

## 2024-05-23 DIAGNOSIS — R296 Repeated falls: Secondary | ICD-10-CM | POA: Diagnosis not present

## 2024-05-23 DIAGNOSIS — F5105 Insomnia due to other mental disorder: Secondary | ICD-10-CM | POA: Diagnosis not present

## 2024-05-23 DIAGNOSIS — Z9181 History of falling: Secondary | ICD-10-CM | POA: Diagnosis not present

## 2024-05-23 DIAGNOSIS — F411 Generalized anxiety disorder: Secondary | ICD-10-CM | POA: Diagnosis not present

## 2024-05-24 DIAGNOSIS — Z9181 History of falling: Secondary | ICD-10-CM | POA: Diagnosis not present

## 2024-05-24 DIAGNOSIS — R296 Repeated falls: Secondary | ICD-10-CM | POA: Diagnosis not present

## 2024-05-24 DIAGNOSIS — R2689 Other abnormalities of gait and mobility: Secondary | ICD-10-CM | POA: Diagnosis not present

## 2024-05-24 DIAGNOSIS — M6281 Muscle weakness (generalized): Secondary | ICD-10-CM | POA: Diagnosis not present

## 2024-05-24 DIAGNOSIS — G309 Alzheimer's disease, unspecified: Secondary | ICD-10-CM | POA: Diagnosis not present

## 2024-05-24 DIAGNOSIS — Z471 Aftercare following joint replacement surgery: Secondary | ICD-10-CM | POA: Diagnosis not present

## 2024-05-25 DIAGNOSIS — Z9181 History of falling: Secondary | ICD-10-CM | POA: Diagnosis not present

## 2024-05-25 DIAGNOSIS — R296 Repeated falls: Secondary | ICD-10-CM | POA: Diagnosis not present

## 2024-05-25 DIAGNOSIS — M6281 Muscle weakness (generalized): Secondary | ICD-10-CM | POA: Diagnosis not present

## 2024-05-25 DIAGNOSIS — G309 Alzheimer's disease, unspecified: Secondary | ICD-10-CM | POA: Diagnosis not present

## 2024-05-25 DIAGNOSIS — Z471 Aftercare following joint replacement surgery: Secondary | ICD-10-CM | POA: Diagnosis not present

## 2024-05-25 DIAGNOSIS — R2689 Other abnormalities of gait and mobility: Secondary | ICD-10-CM | POA: Diagnosis not present

## 2024-05-26 DIAGNOSIS — R2689 Other abnormalities of gait and mobility: Secondary | ICD-10-CM | POA: Diagnosis not present

## 2024-05-26 DIAGNOSIS — M6281 Muscle weakness (generalized): Secondary | ICD-10-CM | POA: Diagnosis not present

## 2024-05-26 DIAGNOSIS — G309 Alzheimer's disease, unspecified: Secondary | ICD-10-CM | POA: Diagnosis not present

## 2024-05-26 DIAGNOSIS — R296 Repeated falls: Secondary | ICD-10-CM | POA: Diagnosis not present

## 2024-05-26 DIAGNOSIS — Z9181 History of falling: Secondary | ICD-10-CM | POA: Diagnosis not present

## 2024-05-26 DIAGNOSIS — Z471 Aftercare following joint replacement surgery: Secondary | ICD-10-CM | POA: Diagnosis not present

## 2024-05-29 DIAGNOSIS — Z471 Aftercare following joint replacement surgery: Secondary | ICD-10-CM | POA: Diagnosis not present

## 2024-05-29 DIAGNOSIS — R2689 Other abnormalities of gait and mobility: Secondary | ICD-10-CM | POA: Diagnosis not present

## 2024-05-29 DIAGNOSIS — G309 Alzheimer's disease, unspecified: Secondary | ICD-10-CM | POA: Diagnosis not present

## 2024-05-29 DIAGNOSIS — R296 Repeated falls: Secondary | ICD-10-CM | POA: Diagnosis not present

## 2024-05-29 DIAGNOSIS — Z9181 History of falling: Secondary | ICD-10-CM | POA: Diagnosis not present

## 2024-05-29 DIAGNOSIS — M6281 Muscle weakness (generalized): Secondary | ICD-10-CM | POA: Diagnosis not present

## 2024-05-30 DIAGNOSIS — Z471 Aftercare following joint replacement surgery: Secondary | ICD-10-CM | POA: Diagnosis not present

## 2024-05-30 DIAGNOSIS — Z9181 History of falling: Secondary | ICD-10-CM | POA: Diagnosis not present

## 2024-05-30 DIAGNOSIS — R2689 Other abnormalities of gait and mobility: Secondary | ICD-10-CM | POA: Diagnosis not present

## 2024-05-30 DIAGNOSIS — G309 Alzheimer's disease, unspecified: Secondary | ICD-10-CM | POA: Diagnosis not present

## 2024-05-30 DIAGNOSIS — R296 Repeated falls: Secondary | ICD-10-CM | POA: Diagnosis not present

## 2024-05-30 DIAGNOSIS — M6281 Muscle weakness (generalized): Secondary | ICD-10-CM | POA: Diagnosis not present

## 2024-05-31 DIAGNOSIS — Z471 Aftercare following joint replacement surgery: Secondary | ICD-10-CM | POA: Diagnosis not present

## 2024-05-31 DIAGNOSIS — Z9181 History of falling: Secondary | ICD-10-CM | POA: Diagnosis not present

## 2024-05-31 DIAGNOSIS — R2689 Other abnormalities of gait and mobility: Secondary | ICD-10-CM | POA: Diagnosis not present

## 2024-05-31 DIAGNOSIS — R296 Repeated falls: Secondary | ICD-10-CM | POA: Diagnosis not present

## 2024-05-31 DIAGNOSIS — G309 Alzheimer's disease, unspecified: Secondary | ICD-10-CM | POA: Diagnosis not present

## 2024-05-31 DIAGNOSIS — M6281 Muscle weakness (generalized): Secondary | ICD-10-CM | POA: Diagnosis not present

## 2024-06-06 DIAGNOSIS — Z79899 Other long term (current) drug therapy: Secondary | ICD-10-CM | POA: Diagnosis not present

## 2024-06-06 DIAGNOSIS — I10 Essential (primary) hypertension: Secondary | ICD-10-CM | POA: Diagnosis not present

## 2024-06-06 DIAGNOSIS — F0150 Vascular dementia without behavioral disturbance: Secondary | ICD-10-CM | POA: Diagnosis not present

## 2024-06-06 DIAGNOSIS — F411 Generalized anxiety disorder: Secondary | ICD-10-CM | POA: Diagnosis not present

## 2024-06-06 DIAGNOSIS — F5105 Insomnia due to other mental disorder: Secondary | ICD-10-CM | POA: Diagnosis not present

## 2024-06-06 DIAGNOSIS — F331 Major depressive disorder, recurrent, moderate: Secondary | ICD-10-CM | POA: Diagnosis not present

## 2024-06-12 DIAGNOSIS — D509 Iron deficiency anemia, unspecified: Secondary | ICD-10-CM | POA: Diagnosis not present

## 2024-06-12 DIAGNOSIS — E039 Hypothyroidism, unspecified: Secondary | ICD-10-CM | POA: Diagnosis not present

## 2024-06-12 DIAGNOSIS — N189 Chronic kidney disease, unspecified: Secondary | ICD-10-CM | POA: Diagnosis not present

## 2024-06-12 DIAGNOSIS — I129 Hypertensive chronic kidney disease with stage 1 through stage 4 chronic kidney disease, or unspecified chronic kidney disease: Secondary | ICD-10-CM | POA: Diagnosis not present

## 2024-06-12 DIAGNOSIS — F0283 Dementia in other diseases classified elsewhere, unspecified severity, with mood disturbance: Secondary | ICD-10-CM | POA: Diagnosis not present

## 2024-06-13 DIAGNOSIS — N1831 Chronic kidney disease, stage 3a: Secondary | ICD-10-CM | POA: Diagnosis not present

## 2024-06-16 DIAGNOSIS — F0390 Unspecified dementia without behavioral disturbance: Secondary | ICD-10-CM | POA: Diagnosis not present

## 2024-06-16 DIAGNOSIS — D509 Iron deficiency anemia, unspecified: Secondary | ICD-10-CM | POA: Diagnosis not present

## 2024-06-16 DIAGNOSIS — R0902 Hypoxemia: Secondary | ICD-10-CM | POA: Diagnosis not present

## 2024-06-16 DIAGNOSIS — N189 Chronic kidney disease, unspecified: Secondary | ICD-10-CM | POA: Diagnosis not present

## 2024-06-16 DIAGNOSIS — I129 Hypertensive chronic kidney disease with stage 1 through stage 4 chronic kidney disease, or unspecified chronic kidney disease: Secondary | ICD-10-CM | POA: Diagnosis not present

## 2024-06-19 DIAGNOSIS — D509 Iron deficiency anemia, unspecified: Secondary | ICD-10-CM | POA: Diagnosis not present

## 2024-06-19 DIAGNOSIS — J189 Pneumonia, unspecified organism: Secondary | ICD-10-CM | POA: Diagnosis not present

## 2024-06-19 DIAGNOSIS — N189 Chronic kidney disease, unspecified: Secondary | ICD-10-CM | POA: Diagnosis not present

## 2024-06-19 DIAGNOSIS — I129 Hypertensive chronic kidney disease with stage 1 through stage 4 chronic kidney disease, or unspecified chronic kidney disease: Secondary | ICD-10-CM | POA: Diagnosis not present

## 2024-06-19 DIAGNOSIS — F03911 Unspecified dementia, unspecified severity, with agitation: Secondary | ICD-10-CM | POA: Diagnosis not present

## 2024-06-20 DIAGNOSIS — F5105 Insomnia due to other mental disorder: Secondary | ICD-10-CM | POA: Diagnosis not present

## 2024-06-20 DIAGNOSIS — F0150 Vascular dementia without behavioral disturbance: Secondary | ICD-10-CM | POA: Diagnosis not present

## 2024-06-20 DIAGNOSIS — F331 Major depressive disorder, recurrent, moderate: Secondary | ICD-10-CM | POA: Diagnosis not present

## 2024-06-20 DIAGNOSIS — Z79899 Other long term (current) drug therapy: Secondary | ICD-10-CM | POA: Diagnosis not present

## 2024-06-20 DIAGNOSIS — J189 Pneumonia, unspecified organism: Secondary | ICD-10-CM | POA: Diagnosis not present

## 2024-06-20 DIAGNOSIS — F411 Generalized anxiety disorder: Secondary | ICD-10-CM | POA: Diagnosis not present
